# Patient Record
Sex: Female | Born: 1950 | Race: White | Hispanic: No | Smoking: Former smoker
Health system: Southern US, Community
[De-identification: ages and names within clinical notes are randomized; demographics above are authoritative.]

## PROBLEM LIST (undated history)

## (undated) DIAGNOSIS — D485 Neoplasm of uncertain behavior of skin: Secondary | ICD-10-CM

## (undated) DIAGNOSIS — M899 Disorder of bone, unspecified: Secondary | ICD-10-CM

## (undated) DIAGNOSIS — T7840XA Allergy, unspecified, initial encounter: Secondary | ICD-10-CM

## (undated) DIAGNOSIS — D649 Anemia, unspecified: Secondary | ICD-10-CM

## (undated) DIAGNOSIS — B977 Papillomavirus as the cause of diseases classified elsewhere: Secondary | ICD-10-CM

## (undated) DIAGNOSIS — H269 Unspecified cataract: Secondary | ICD-10-CM

## (undated) DIAGNOSIS — N6019 Diffuse cystic mastopathy of unspecified breast: Secondary | ICD-10-CM

## (undated) DIAGNOSIS — I10 Essential (primary) hypertension: Secondary | ICD-10-CM

## (undated) DIAGNOSIS — J302 Other seasonal allergic rhinitis: Secondary | ICD-10-CM

## (undated) DIAGNOSIS — J189 Pneumonia, unspecified organism: Secondary | ICD-10-CM

## (undated) DIAGNOSIS — Z1231 Encounter for screening mammogram for malignant neoplasm of breast: Secondary | ICD-10-CM

## (undated) DIAGNOSIS — M949 Disorder of cartilage, unspecified: Secondary | ICD-10-CM

## (undated) DIAGNOSIS — K635 Polyp of colon: Secondary | ICD-10-CM

## (undated) DIAGNOSIS — E559 Vitamin D deficiency, unspecified: Secondary | ICD-10-CM

## (undated) DIAGNOSIS — R609 Edema, unspecified: Secondary | ICD-10-CM

## (undated) DIAGNOSIS — Z1239 Encounter for other screening for malignant neoplasm of breast: Secondary | ICD-10-CM

## (undated) DIAGNOSIS — Z01419 Encounter for gynecological examination (general) (routine) without abnormal findings: Secondary | ICD-10-CM

## (undated) DIAGNOSIS — Z803 Family history of malignant neoplasm of breast: Secondary | ICD-10-CM

## (undated) DIAGNOSIS — M8580 Other specified disorders of bone density and structure, unspecified site: Secondary | ICD-10-CM

## (undated) DIAGNOSIS — R001 Bradycardia, unspecified: Secondary | ICD-10-CM

## (undated) DIAGNOSIS — M810 Age-related osteoporosis without current pathological fracture: Secondary | ICD-10-CM

## (undated) DIAGNOSIS — R4589 Other symptoms and signs involving emotional state: Secondary | ICD-10-CM

## (undated) DIAGNOSIS — E876 Hypokalemia: Secondary | ICD-10-CM

## (undated) DIAGNOSIS — M1990 Unspecified osteoarthritis, unspecified site: Secondary | ICD-10-CM

## (undated) HISTORY — PX: LEEP: SHX91

## (undated) HISTORY — DX: Neoplasm of uncertain behavior of skin: D48.5

## (undated) HISTORY — DX: Other specified disorders of bone density and structure, unspecified site: M85.80

## (undated) HISTORY — DX: Hypokalemia: E87.6

## (undated) HISTORY — DX: Disorder of bone, unspecified: M89.9

## (undated) HISTORY — PX: OTHER SURGICAL HISTORY: SHX169

## (undated) HISTORY — DX: Edema, unspecified: R60.9

## (undated) HISTORY — DX: Vitamin D deficiency, unspecified: E55.9

## (undated) HISTORY — DX: Bradycardia, unspecified: R00.1

## (undated) HISTORY — DX: Unspecified cataract: H26.9

## (undated) HISTORY — DX: Other seasonal allergic rhinitis: J30.2

## (undated) HISTORY — DX: Unspecified osteoarthritis, unspecified site: M19.90

## (undated) HISTORY — DX: Disorder of cartilage, unspecified: M94.9

## (undated) HISTORY — DX: Encounter for other screening for malignant neoplasm of breast: Z12.39

## (undated) HISTORY — PX: TONSILLECTOMY: SUR1361

## (undated) HISTORY — DX: Other symptoms and signs involving emotional state: R45.89

## (undated) HISTORY — PX: COLONOSCOPY: SHX174

## (undated) HISTORY — PX: JOINT REPLACEMENT: SHX530

## (undated) HISTORY — DX: Encounter for gynecological examination (general) (routine) without abnormal findings: Z01.419

## (undated) HISTORY — DX: Anemia, unspecified: D64.9

## (undated) HISTORY — DX: Polyp of colon: K63.5

## (undated) HISTORY — DX: Diffuse cystic mastopathy of unspecified breast: N60.19

## (undated) HISTORY — DX: Essential (primary) hypertension: I10

## (undated) HISTORY — PX: POLYPECTOMY: SHX149

## (undated) HISTORY — DX: Encounter for screening mammogram for malignant neoplasm of breast: Z12.31

## (undated) HISTORY — DX: Age-related osteoporosis without current pathological fracture: M81.0

## (undated) HISTORY — PX: EYE SURGERY: SHX253

## (undated) HISTORY — DX: Family history of malignant neoplasm of breast: Z80.3

## (undated) HISTORY — DX: Papillomavirus as the cause of diseases classified elsewhere: B97.7

## (undated) HISTORY — DX: Allergy, unspecified, initial encounter: T78.40XA

---

## 1998-03-14 ENCOUNTER — Other Ambulatory Visit: Admission: RE | Admit: 1998-03-14 | Discharge: 1998-03-14 | Payer: Self-pay | Admitting: Obstetrics and Gynecology

## 1998-10-11 ENCOUNTER — Other Ambulatory Visit: Admission: RE | Admit: 1998-10-11 | Discharge: 1998-10-11 | Payer: Self-pay | Admitting: *Deleted

## 2000-02-27 ENCOUNTER — Other Ambulatory Visit: Admission: RE | Admit: 2000-02-27 | Discharge: 2000-02-27 | Payer: Self-pay | Admitting: *Deleted

## 2001-09-24 ENCOUNTER — Other Ambulatory Visit: Admission: RE | Admit: 2001-09-24 | Discharge: 2001-09-24 | Payer: Self-pay | Admitting: *Deleted

## 2003-11-09 ENCOUNTER — Other Ambulatory Visit: Admission: RE | Admit: 2003-11-09 | Discharge: 2003-11-09 | Payer: Self-pay | Admitting: Family Medicine

## 2003-12-22 HISTORY — PX: OTHER SURGICAL HISTORY: SHX169

## 2004-02-15 ENCOUNTER — Encounter: Admission: RE | Admit: 2004-02-15 | Discharge: 2004-02-15 | Payer: Self-pay | Admitting: Family Medicine

## 2005-03-06 ENCOUNTER — Other Ambulatory Visit: Admission: RE | Admit: 2005-03-06 | Discharge: 2005-03-06 | Payer: Self-pay | Admitting: Family Medicine

## 2005-03-06 ENCOUNTER — Ambulatory Visit: Payer: Self-pay | Admitting: Family Medicine

## 2005-04-05 ENCOUNTER — Ambulatory Visit: Payer: Self-pay | Admitting: Family Medicine

## 2005-09-20 ENCOUNTER — Ambulatory Visit: Payer: Self-pay | Admitting: Family Medicine

## 2006-05-23 HISTORY — PX: OTHER SURGICAL HISTORY: SHX169

## 2006-06-03 ENCOUNTER — Ambulatory Visit: Payer: Self-pay | Admitting: Internal Medicine

## 2006-06-04 ENCOUNTER — Encounter: Payer: Self-pay | Admitting: Family Medicine

## 2006-06-04 ENCOUNTER — Ambulatory Visit: Payer: Self-pay | Admitting: Family Medicine

## 2006-06-04 ENCOUNTER — Other Ambulatory Visit: Admission: RE | Admit: 2006-06-04 | Discharge: 2006-06-04 | Payer: Self-pay | Admitting: Family Medicine

## 2007-01-14 ENCOUNTER — Encounter: Payer: Self-pay | Admitting: Family Medicine

## 2007-01-14 DIAGNOSIS — M8580 Other specified disorders of bone density and structure, unspecified site: Secondary | ICD-10-CM

## 2007-01-14 DIAGNOSIS — R609 Edema, unspecified: Secondary | ICD-10-CM

## 2007-11-25 ENCOUNTER — Ambulatory Visit: Payer: Self-pay | Admitting: Family Medicine

## 2007-11-25 ENCOUNTER — Encounter: Payer: Self-pay | Admitting: Family Medicine

## 2007-11-25 ENCOUNTER — Other Ambulatory Visit: Admission: RE | Admit: 2007-11-25 | Discharge: 2007-11-25 | Payer: Self-pay | Admitting: Family Medicine

## 2007-11-25 DIAGNOSIS — R4589 Other symptoms and signs involving emotional state: Secondary | ICD-10-CM | POA: Insufficient documentation

## 2007-12-02 ENCOUNTER — Encounter (INDEPENDENT_AMBULATORY_CARE_PROVIDER_SITE_OTHER): Payer: Self-pay | Admitting: *Deleted

## 2007-12-02 LAB — CONVERTED CEMR LAB
ALT: 15 units/L (ref 0–35)
AST: 18 units/L (ref 0–37)
BUN: 15 mg/dL (ref 6–23)
Bilirubin, Direct: 0.1 mg/dL (ref 0.0–0.3)
Creatinine, Ser: 0.8 mg/dL (ref 0.4–1.2)
Eosinophils Absolute: 0.1 10*3/uL (ref 0.0–0.7)
Eosinophils Relative: 2.3 % (ref 0.0–5.0)
GFR calc non Af Amer: 79 mL/min
Glucose, Bld: 106 mg/dL — ABNORMAL HIGH (ref 70–99)
HDL: 62.7 mg/dL (ref >39.0)
Hemoglobin: 12.8 g/dL (ref 12.0–15.0)
LDL Cholesterol: 107 mg/dL — ABNORMAL HIGH (ref 0–99)
MCHC: 34.3 g/dL (ref 30.0–36.0)
Monocytes Absolute: 0.5 10*3/uL (ref 0.1–1.0)
Neutro Abs: 3.1 10*3/uL (ref 1.4–7.7)
Neutrophils Relative %: 57.2 % (ref 43.0–77.0)
Platelets: 243 10*3/uL (ref 150–400)
Potassium: 3.9 meq/L (ref 3.5–5.1)
Sodium: 141 meq/L (ref 135–145)
TSH: 2.05 microintl units/mL (ref 0.35–5.50)
Total Bilirubin: 0.8 mg/dL (ref 0.3–1.2)
Triglycerides: 71 mg/dL (ref 0–149)
VLDL: 14 mg/dL (ref 0–40)
Vit D, 1,25-Dihydroxy: 12 — ABNORMAL LOW (ref 30–89)
WBC: 5.3 10*3/uL (ref 4.5–10.5)

## 2007-12-22 DIAGNOSIS — K635 Polyp of colon: Secondary | ICD-10-CM

## 2007-12-22 HISTORY — DX: Polyp of colon: K63.5

## 2007-12-29 ENCOUNTER — Ambulatory Visit: Payer: Self-pay | Admitting: Gastroenterology

## 2008-01-01 ENCOUNTER — Encounter: Payer: Self-pay | Admitting: Family Medicine

## 2008-01-07 ENCOUNTER — Encounter (INDEPENDENT_AMBULATORY_CARE_PROVIDER_SITE_OTHER): Payer: Self-pay | Admitting: *Deleted

## 2008-01-12 ENCOUNTER — Encounter: Payer: Self-pay | Admitting: Gastroenterology

## 2008-01-12 ENCOUNTER — Ambulatory Visit: Payer: Self-pay | Admitting: Gastroenterology

## 2008-01-13 ENCOUNTER — Encounter: Payer: Self-pay | Admitting: Gastroenterology

## 2008-03-24 ENCOUNTER — Telehealth: Payer: Self-pay | Admitting: Family Medicine

## 2008-05-18 ENCOUNTER — Encounter (INDEPENDENT_AMBULATORY_CARE_PROVIDER_SITE_OTHER): Payer: Self-pay | Admitting: *Deleted

## 2008-07-23 HISTORY — PX: OTHER SURGICAL HISTORY: SHX169

## 2008-07-28 ENCOUNTER — Encounter: Payer: Self-pay | Admitting: Family Medicine

## 2008-07-28 ENCOUNTER — Ambulatory Visit: Payer: Self-pay | Admitting: Internal Medicine

## 2009-01-05 ENCOUNTER — Encounter: Payer: Self-pay | Admitting: Family Medicine

## 2009-01-06 ENCOUNTER — Telehealth: Payer: Self-pay | Admitting: Family Medicine

## 2009-01-28 ENCOUNTER — Encounter: Payer: Self-pay | Admitting: Family Medicine

## 2009-02-01 ENCOUNTER — Encounter (INDEPENDENT_AMBULATORY_CARE_PROVIDER_SITE_OTHER): Payer: Self-pay | Admitting: *Deleted

## 2009-02-24 ENCOUNTER — Ambulatory Visit: Payer: Self-pay | Admitting: Family Medicine

## 2009-02-24 ENCOUNTER — Other Ambulatory Visit: Admission: RE | Admit: 2009-02-24 | Discharge: 2009-02-24 | Payer: Self-pay | Admitting: Family Medicine

## 2009-02-24 ENCOUNTER — Encounter: Payer: Self-pay | Admitting: Family Medicine

## 2009-02-24 DIAGNOSIS — E559 Vitamin D deficiency, unspecified: Secondary | ICD-10-CM | POA: Insufficient documentation

## 2009-02-24 DIAGNOSIS — D485 Neoplasm of uncertain behavior of skin: Secondary | ICD-10-CM

## 2009-02-28 LAB — CONVERTED CEMR LAB
ALT: 20 units/L (ref 0–35)
Albumin: 4.2 g/dL (ref 3.5–5.2)
Basophils Absolute: 0.1 10*3/uL (ref 0.0–0.1)
CO2: 28 meq/L (ref 19–32)
Chloride: 105 meq/L (ref 96–112)
Glucose, Bld: 95 mg/dL (ref 70–99)
HCT: 36.4 % (ref 36.0–46.0)
HDL: 55.1 mg/dL (ref >39.00)
LDL Cholesterol: 108 mg/dL — ABNORMAL HIGH (ref 0–99)
Lymphocytes Relative: 30.6 % (ref 12.0–46.0)
Lymphs Abs: 1.5 10*3/uL (ref 0.7–4.0)
MCV: 90.2 fL (ref 78.0–100.0)
Neutro Abs: 2.6 10*3/uL (ref 1.4–7.7)
Neutrophils Relative %: 55.4 % (ref 43.0–77.0)
Platelets: 185 10*3/uL (ref 150.0–400.0)
RDW: 13 % (ref 11.5–14.6)
Sodium: 143 meq/L (ref 135–145)
TSH: 2.29 microintl units/mL (ref 0.35–5.50)
Total CHOL/HDL Ratio: 3
Total Protein: 7.4 g/dL (ref 6.0–8.3)

## 2009-03-02 ENCOUNTER — Encounter (INDEPENDENT_AMBULATORY_CARE_PROVIDER_SITE_OTHER): Payer: Self-pay | Admitting: *Deleted

## 2009-03-10 ENCOUNTER — Encounter: Payer: Self-pay | Admitting: Family Medicine

## 2009-03-17 ENCOUNTER — Ambulatory Visit: Payer: Self-pay | Admitting: Family Medicine

## 2009-03-31 ENCOUNTER — Ambulatory Visit: Payer: Self-pay | Admitting: Family Medicine

## 2009-04-07 ENCOUNTER — Encounter (INDEPENDENT_AMBULATORY_CARE_PROVIDER_SITE_OTHER): Payer: Self-pay | Admitting: *Deleted

## 2009-04-07 ENCOUNTER — Encounter: Payer: Self-pay | Admitting: Family Medicine

## 2009-04-07 LAB — CONVERTED CEMR LAB
BUN: 12 mg/dL (ref 6–23)
CO2: 33 meq/L — ABNORMAL HIGH (ref 19–32)
Calcium: 9.6 mg/dL (ref 8.4–10.5)
Chloride: 97 meq/L (ref 96–112)
Creatinine, Ser: 0.9 mg/dL (ref 0.4–1.2)
Glucose, Bld: 109 mg/dL — ABNORMAL HIGH (ref 70–99)
Phosphorus: 3.7 mg/dL (ref 2.3–4.6)
Potassium: 3.2 meq/L — ABNORMAL LOW (ref 3.5–5.1)
Sodium: 138 meq/L (ref 135–145)

## 2009-04-22 ENCOUNTER — Ambulatory Visit: Payer: Self-pay | Admitting: Family Medicine

## 2009-04-22 DIAGNOSIS — E876 Hypokalemia: Secondary | ICD-10-CM | POA: Insufficient documentation

## 2009-08-23 ENCOUNTER — Other Ambulatory Visit: Admission: RE | Admit: 2009-08-23 | Discharge: 2009-08-23 | Payer: Self-pay | Admitting: Family Medicine

## 2009-08-23 ENCOUNTER — Ambulatory Visit: Payer: Self-pay | Admitting: Family Medicine

## 2009-08-23 DIAGNOSIS — B977 Papillomavirus as the cause of diseases classified elsewhere: Secondary | ICD-10-CM | POA: Insufficient documentation

## 2009-08-24 ENCOUNTER — Telehealth: Payer: Self-pay | Admitting: Family Medicine

## 2010-01-20 HISTORY — PX: BREAST BIOPSY: SHX20

## 2010-01-20 HISTORY — PX: OTHER SURGICAL HISTORY: SHX169

## 2010-01-25 ENCOUNTER — Encounter: Payer: Self-pay | Admitting: Family Medicine

## 2010-01-27 ENCOUNTER — Encounter: Payer: Self-pay | Admitting: Family Medicine

## 2010-02-01 ENCOUNTER — Encounter: Payer: Self-pay | Admitting: Family Medicine

## 2010-02-06 ENCOUNTER — Encounter: Payer: Self-pay | Admitting: Family Medicine

## 2010-02-07 ENCOUNTER — Ambulatory Visit: Payer: Self-pay | Admitting: Oncology

## 2010-02-09 ENCOUNTER — Encounter: Admission: RE | Admit: 2010-02-09 | Discharge: 2010-02-09 | Payer: Self-pay | Admitting: Radiology

## 2010-02-16 ENCOUNTER — Encounter: Payer: Self-pay | Admitting: Family Medicine

## 2010-02-16 LAB — CBC WITH DIFFERENTIAL (CANCER CENTER ONLY)
BASO#: 0 10*3/uL (ref 0.0–0.2)
BASO%: 0.5 % (ref 0.0–2.0)
EOS%: 2.2 % (ref 0.0–7.0)
Eosinophils Absolute: 0.1 10*3/uL (ref 0.0–0.5)
HCT: 35.2 % (ref 34.8–46.6)
HGB: 12.4 g/dL (ref 11.6–15.9)
LYMPH#: 1.5 10*3/uL (ref 0.9–3.3)
LYMPH%: 31.5 % (ref 14.0–48.0)
MCHC: 35.2 g/dL (ref 32.0–36.0)
RBC: 3.88 10*6/uL (ref 3.70–5.32)
RDW: 12.5 % (ref 10.5–14.6)

## 2010-02-16 LAB — CMP (CANCER CENTER ONLY)
BUN, Bld: 18 mg/dL (ref 7–22)
Chloride: 103 mEq/L (ref 98–108)
Glucose, Bld: 78 mg/dL (ref 73–118)
Potassium: 4 mEq/L (ref 3.3–4.7)

## 2010-02-20 HISTORY — PX: BREAST BIOPSY: SHX20

## 2010-04-07 ENCOUNTER — Telehealth (INDEPENDENT_AMBULATORY_CARE_PROVIDER_SITE_OTHER): Payer: Self-pay | Admitting: *Deleted

## 2010-04-11 ENCOUNTER — Ambulatory Visit: Payer: Self-pay | Admitting: Family Medicine

## 2010-04-11 LAB — CONVERTED CEMR LAB
ALT: 11 units/L (ref 0–35)
BUN: 18 mg/dL (ref 6–23)
Basophils Absolute: 0 10*3/uL (ref 0.0–0.1)
Basophils Relative: 0.8 % (ref 0.0–3.0)
Bilirubin, Direct: 0.1 mg/dL (ref 0.0–0.3)
CO2: 28 meq/L (ref 19–32)
Creatinine, Ser: 0.7 mg/dL (ref 0.4–1.2)
HCT: 37.7 % (ref 36.0–46.0)
LDL Cholesterol: 95 mg/dL (ref 0–99)
Lymphs Abs: 1.6 10*3/uL (ref 0.7–4.0)
MCHC: 34.2 g/dL (ref 30.0–36.0)
MCV: 94 fL (ref 78.0–100.0)
Neutro Abs: 3.5 10*3/uL (ref 1.4–7.7)
Neutrophils Relative %: 60.7 % (ref 43.0–77.0)
RDW: 14 % (ref 11.5–14.6)
TSH: 2.58 microintl units/mL (ref 0.35–5.50)
Total Protein: 6.4 g/dL (ref 6.0–8.3)

## 2010-04-13 ENCOUNTER — Ambulatory Visit: Payer: Self-pay | Admitting: Oncology

## 2010-04-13 LAB — CMP (CANCER CENTER ONLY)
Albumin: 3.6 g/dL (ref 3.3–5.5)
Alkaline Phosphatase: 59 U/L (ref 26–84)
BUN, Bld: 15 mg/dL (ref 7–22)
CO2: 29 mEq/L (ref 18–33)
Potassium: 4.6 mEq/L (ref 3.3–4.7)
Sodium: 140 mEq/L (ref 128–145)

## 2010-04-13 LAB — CBC WITH DIFFERENTIAL (CANCER CENTER ONLY)
BASO#: 0 10*3/uL (ref 0.0–0.2)
EOS%: 1.5 % (ref 0.0–7.0)
Eosinophils Absolute: 0.1 10*3/uL (ref 0.0–0.5)
HCT: 34.9 % (ref 34.8–46.6)
LYMPH#: 2.1 10*3/uL (ref 0.9–3.3)
MCHC: 34.8 g/dL (ref 32.0–36.0)
NEUT#: 3.9 10*3/uL (ref 1.5–6.5)
Platelets: 205 10*3/uL (ref 145–400)
RDW: 12.7 % (ref 10.5–14.6)

## 2010-04-14 ENCOUNTER — Ambulatory Visit: Payer: Self-pay | Admitting: Family Medicine

## 2010-04-14 ENCOUNTER — Other Ambulatory Visit: Admission: RE | Admit: 2010-04-14 | Discharge: 2010-04-14 | Payer: Self-pay | Admitting: Family Medicine

## 2010-04-19 ENCOUNTER — Encounter: Payer: Self-pay | Admitting: Family Medicine

## 2010-07-10 ENCOUNTER — Encounter: Payer: Self-pay | Admitting: Family Medicine

## 2010-07-10 ENCOUNTER — Ambulatory Visit: Payer: Self-pay | Admitting: Family Medicine

## 2010-07-12 ENCOUNTER — Ambulatory Visit: Payer: Self-pay | Admitting: Oncology

## 2010-07-12 ENCOUNTER — Encounter: Payer: Self-pay | Admitting: Family Medicine

## 2010-07-13 ENCOUNTER — Encounter: Payer: Self-pay | Admitting: Family Medicine

## 2010-07-13 LAB — IRON AND TIBC
%SAT: 25 % (ref 20–55)
Iron: 71 ug/dL (ref 42–145)
TIBC: 284 ug/dL (ref 250–470)
UIBC: 213 ug/dL

## 2010-08-04 ENCOUNTER — Encounter: Payer: Self-pay | Admitting: Family Medicine

## 2010-08-08 ENCOUNTER — Encounter (INDEPENDENT_AMBULATORY_CARE_PROVIDER_SITE_OTHER): Payer: Self-pay | Admitting: *Deleted

## 2010-08-09 ENCOUNTER — Encounter: Payer: Self-pay | Admitting: Family Medicine

## 2010-08-09 ENCOUNTER — Ambulatory Visit
Admission: RE | Admit: 2010-08-09 | Discharge: 2010-08-09 | Payer: Self-pay | Source: Home / Self Care | Attending: Family Medicine | Admitting: Family Medicine

## 2010-08-24 NOTE — Letter (Signed)
Summary: Out of Work  [employer] at [facility]  [address] [place], [place] [address]   Phone: [phone]  Fax: [phone]    August 09, 2010   Employee:  DORISTINE SHEHAN    To Whom It May Concern:   For Medical reasons, please excuse the above named employee from work for the following dates:  Start:   08/09/2010  End:   08/12/2010 if she is feeling better   If you need additional information, please feel free to contact our office.         Sincerely,    Judith Part MD

## 2010-08-24 NOTE — Assessment & Plan Note (Signed)
Summary: GO OVER MEDICAL HISTORY/DLO   Vital Signs:  Patient profile:   60 year old female Weight:      194.25 pounds BMI:     32.44 Temp:     97.9 degrees F oral Pulse rate:   76 / minute Pulse rhythm:   regular BP sitting:   130 / 76  (left arm) Cuff size:   large  Vitals Entered By: Linde Gillis CMA Duncan Dull) (August 23, 2009 3:15 PM) CC: go over medications   History of Present Illness: is doing ok overall  wants to disc getting hpv test  used to see Dr Roslyn Smiling -- gyn , 1990s  had a series of bad pap smears back then and was dx with HPV  (about 10 years after last sexual contact )  no hx of genital warts or other symptoms  has not been sexually active since then has met someone - and is considering it   wants to know what that all means   no other stds in her lifetime     Allergies: 1)  Fosamax  Past History:  Past Surgical History: Last updated: 08/09/2008 Tonsillectomy LEEP-HPV Dexa- osteopenia (12/2003)   stable (05/2006) dexa- osteopenia slt imp (01/10)  Family History: Last updated: 11/25/2007 Father: diabetes, ?skin cancer Mother: deceased age 22- breast ca, OP sister Osteopenia , breast cancer  GM breast ca cousin breast ca  PGF MI  Siblings:   Social History: Last updated: 11/25/2007 Marital Status: divorced Children: 1 son 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 Eyes:  Denies blurring and eye irritation. CV:  Denies chest pain or discomfort, palpitations, and shortness of breath with exertion. Resp:  Denies cough and wheezing. GI:  Denies abdominal pain, bloody stools, and change in bowel habits. GU:  Denies abnormal  vaginal bleeding, discharge, dysuria, genital sores, and urinary frequency. MS:  Denies joint pain. Derm:  Denies lesion(s), poor wound healing, and rash. Neuro:  Denies numbness and tingling. Psych:  Denies anxiety and depression.  Physical Exam  General:  overweight but generally well appearing  Mouth:  pharynx pink and moist.   Neck:  No deformities, masses, or tenderness noted. Lungs:  Normal respiratory effort, chest expands symmetrically. Lungs are clear to auscultation, no crackles or wheezes. Heart:  Normal rate and regular rhythm. S1 and S2 normal without gallop, murmur, click, rub or other extra sounds. Abdomen:  no suprapubic tenderness or fullness felt  Genitalia:  normal introitus, no external lesions, no vaginal discharge, mucosa pink and moist, no vaginal or cervical lesions, no vaginal atrophy, and no friaility or hemorrhage.   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 Take by  mouth as directed prn 3)  Tums 500 Mg Chew (Calcium carbonate antacid) .... 2 tabs by mouth two times a day  Patient Instructions: 1)  I will update you when labs return 2)  here is handout on hpv   Current Allergies (reviewed today): FOSAMAX

## 2010-08-24 NOTE — Miscellaneous (Signed)
  Medications Added VITAMIN D 1000 UNIT  TABS (CHOLECALCIFEROL) 4,000 international units daily       Clinical Lists Changes  Medications: Added new medication of VITAMIN D 1000 UNIT  TABS (CHOLECALCIFEROL) 4,000 international units daily

## 2010-08-24 NOTE — Letter (Signed)
[facility]   Imported By: Maryln Gottron 03/07/2010 12:20:15  _____________________________________________________________________  External Attachment:    Type:   Image     Comment:   External Document

## 2010-08-24 NOTE — Letter (Signed)
Summary: [facility]  [facility]   Imported By: Maryln Gottron 07/25/2010 13:09:35  _____________________________________________________________________  External Attachment:    Type:   Image     Comment:   External Document

## 2010-08-24 NOTE — Letter (Signed)
Summary: Results Follow up Letter  [place] at [facility]  [address] [place], [place] [address]   Phone: [phone]  Fax: [phone]    04/19/2010 MRN: [id number]    FRIDA WAHLSTROM [address] [place], [place]  [address]    Dear Ms. Preis,  The following are the results of your recent test(s):  Test         Result    Pap Smear:        Normal __X___  Not Normal _____ Comments: ______________________________________________________ Cholesterol: LDL(Bad cholesterol):         Your goal is less than:         HDL (Good cholesterol):       Your goal is more than: Comments:  ______________________________________________________ Mammogram:        Normal _____  Not Normal _____ Comments:  ___________________________________________________________________ Hemoccult:        Normal _____  Not normal _______ Comments:    _____________________________________________________________________ Other Tests:    We routinely do not discuss normal results over the telephone.  If you desire a copy of the results, or you have any questions about this information we can discuss them at your next office visit.   Sincerely,    Idamae Schuller Tower,MD  MT/ri

## 2010-08-24 NOTE — Assessment & Plan Note (Signed)
Summary: CPX/CLE   Vital Signs:  Patient profile:   60 year old female Height:      65 inches Weight:      190.25 pounds BMI:     31.77 Temp:     98.2 degrees F oral Pulse rate:   76 / minute Pulse rhythm:   regular BP sitting:   128 / 80  (left arm) Cuff size:   large  Vitals Entered By: Lewanda Rife LPN (April 14, 2010 2:34 PM) CC: CPX LMP 6 yrs ago   History of Present Illness: here for health mt visit and gyn care and to rev chronic health problems  is doing ok overall   wt is down 4 lb lost more and gained a bit back is still going to the gym  she was referred to nutritionist  lives on snack foods and too much sodium   bp 128/80 -good   osteopenia 1/10- slt imp in dexa vit D def with level of 27  is not take any vit D   other labs ok good lipid prof Last Lipid ProfileCholesterol: 186 (04/11/2010 8:32:48 AM)HDL:  80.20 (04/11/2010 8:32:48 AM)LDL:  95 (04/11/2010 8:32:48 AM)Triglycerides:  Last Liver profileSGOT:  19 (04/11/2010 8:32:48 AM)SPGT:  11 (04/11/2010 8:32:48 AM)T. Bili:  0.8 (04/11/2010 8:32:48 AM)Alk Phos:  53 (04/11/2010 8:32:48 AM)   hx of abn pap /hpv/ leep pap nl 8/10 no gyn problems   has hx of breast ca in family and personal hx of fibrocystic change on evista for this and also Openia -- is miserable with hot flashes is going to the high risk breast clinic and following with that  had breast MRI this summer that was ok - also a breast bx that was normal    Td 05  flu shot - wants to get today     Allergies: 1)  Fosamax  Past History:  Past Medical History: Last updated: 03/04/2010 Osteopenia 6/09 colonoscopy polyp edema  vit D def low K from diuretic  hx of hpv with abn paps fibrocystic breasts  on evista for breast ca prophylaxis   Past Surgical History: Last updated: 03/04/2010 Tonsillectomy LEEP-HPV Dexa- osteopenia (12/2003)   stable (05/2006) dexa- osteopenia slt imp (01/10) 7/11 breast bx 7/11 breast MRI  normal breast bx fibrocystic change 8/11  Family History: Last updated: 11/25/2007 Father: diabetes, ?skin cancer Mother: deceased age 106- breast ca, OP sister Osteopenia , breast cancer  GM breast ca cousin breast ca  PGF MI  Siblings:   Social History: Last updated: 11/25/2007 Marital Status: divorced Children: 1 son 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 Eyes:  Denies blurring and eye pain. CV:  Denies chest pain or discomfort, palpitations, and shortness of breath with exertion. Resp:  Denies cough, shortness of breath, and wheezing. GI:  Denies abdominal pain, bloody stools, change in bowel habits, and indigestion. GU:  Denies discharge and dysuria. MS:  Denies muscle aches, cramps, and muscle weakness. Derm:  Denies itching, lesion(s), poor wound healing, and rash. Neuro:  Denies headaches, numbness, and tingling. Psych:  Denies anxiety and depression. Endo:  Complains of heat intolerance; denies cold intolerance, excessive thirst, and excessive urination. Heme:  Denies abnormal bruising and bleeding.  Physical Exam  General:  overweight but generally well appearing  Head:  normocephalic,  atraumatic, and no abnormalities observed.   Eyes:  vision grossly intact, pupils equal, pupils round, and pupils reactive to light.   Ears:  R ear normal and L ear normal.   Nose:  no nasal discharge.   Mouth:  pharynx pink and moist.   Neck:  No deformities, masses, or tenderness noted. Chest Wall:  No deformities, masses, or tenderness noted. Breasts:  No mass, nodules, thickening, tenderness, bulging, retraction, inflamation, nipple discharge or skin changes noted.   Lungs:  Normal respiratory effort, chest expands symmetrically. Lungs are clear to auscultation, no crackles or  wheezes. Heart:  Normal rate and regular rhythm. S1 and S2 normal without gallop, murmur, click, rub or other extra sounds. Abdomen:  Bowel sounds positive,abdomen soft and non-tender without masses, organomegaly or hernias noted. no renal bruits  Genitalia:  Normal introitus for age, no external lesions, no vaginal discharge, mucosa pink and moist, no vaginal or cervical lesions, no vaginal atrophy, no friaility or hemorrhage, normal uterus size and position, no adnexal masses or tenderness Msk:  No deformity or scoliosis noted of thoracic or lumbar spine.  no acute joint changes  Pulses:  R and L carotid,radial,femoral,dorsalis pedis and posterior tibial pulses are full and equal bilaterally Extremities:  No clubbing, cyanosis, edema, or deformity noted with normal full range of motion of all joints.   Neurologic:  sensation intact to light touch, gait normal, and DTRs symmetrical and normal.   Skin:  Intact without suspicious lesions or rashes Cervical Nodes:  No lymphadenopathy noted Axillary Nodes:  No palpable lymphadenopathy Inguinal Nodes:  No significant adenopathy Psych:  normal affect, talkative and pleasant    Impression & Recommendations:  Problem # 1:  HEALTH MAINTENANCE EXAM (ICD-V70.0) Assessment Comment Only reviewed health habits including diet, exercise and skin cancer prevention reviewed health maintenance list and family history labs reviewed in detail  flu shot today  Problem # 2:  ROUTINE GYNECOLOGICAL EXAMINATION (ICD-V72.31) Assessment: Comment Only with hx of hpv and leep/ dysplasia in past  pap today  Problem # 3:  HX, FAMILY, MALIGNANCY, BREAST (ICD-V16.3) Assessment: Comment Only up to date and following with high risk clinic  recent MRI and nl bx  on evista and getting by  Problem # 4:  UNSPECIFIED VITAMIN D DEFICIENCY (ICD-268.9) Assessment: Deteriorated pt not on D disc inc to vit D 3 otc 4000 international units daily  re check this in 3 mo   explained imp of this to bone and overall health  Problem # 5:  OSTEOPENIA (ICD-733.90) Assessment: Unchanged in dexa last year now on evista  disc imp of ca and vit D  Her updated medication list for this problem includes:    Evista 60 Mg Tabs (Raloxifene hcl) .Marland Kitchen... Take 1 tablet by mouth once a day  Complete Medication List: 1)  Advil Migraine 200 Mg Caps (Ibuprofen) 2)  Aleve 220 Mg Caps (Naproxen sodium) .... Take by mouth as directed prn 3)  Tums 500 Mg Chew (Calcium carbonate antacid) .... 2 tabs by mouth two times a day 4)  Tylenol Pm Extra Strength 500-25 Mg Tabs (Diphenhydramine-apap (sleep)) .... Otc as directed. 5)  Evista 60 Mg Tabs (Raloxifene hcl) .... Take 1 tablet by mouth once a day  Other Orders: Admin 1st Vaccine (13086) Flu Vaccine 54yrs + (57846)  Patient Instructions: 1)  start multivitamin daily  2)  calcium 1200-1500 mg per day 3)  for now I want you to take 4000 international units of vitamin D per day  4)  schedule non fasting lab for 3 months for vit D level (vit D def please)   Current Allergies (reviewed today): FOSAMAX      Flu Vaccine Consent Questions     Do you have a history of severe allergic reactions to this vaccine? no    Any prior history of allergic reactions to egg and/or gelatin? no    Do you have a sensitivity to the preservative Thimersol? no    Do you have a past history of Guillan-Barre Syndrome? no    Do you currently have an acute febrile illness? no    Have you ever had a severe reaction to latex? no    Vaccine information given and explained to patient? yes    Are you currently pregnant? no    Lot Number:[id number]   Exp Date:01/20/2011   Site Given  Left Deltoid IMlbflu Lewanda Rife LPN  April 14, 2010 3:55 PM

## 2010-08-24 NOTE — Progress Notes (Signed)
Summary: does she need a pap  Phone Note Other Incoming Call back at [phone]   Caller: Dois Davenport from [facility] Summary of Call: Dois Davenport from [employer] called about patient being there for the HPV and wants to know if you want a pap done as well.  Initial call taken by: Melody Comas,  August 24, 2009 9:11 AM  Follow-up for Phone Call        thanks- no she does not need a pap -- had one in summer  just want to know hpv status from infection years ago  Follow-up by: Judith Part MD,  August 24, 2009 10:11 AM  Additional Follow-up for Phone Call Additional follow up Details #1::        Advised Santra at cytology. Additional Follow-up by: Lowella Petties CMA,  August 24, 2009 10:18 AM

## 2010-08-24 NOTE — Letter (Signed)
Summary: Results Follow up Letter  [place] at [facility]  [address] [place], [place] [address]   Phone: [phone]  Fax: [phone]    08/08/2010 MRN: [id number]  [address] [place], [place]  [address]  Dear Ms. [name],  The following are the results of your recent test(s):  Test         Result    Pap Smear:        Normal _____  Not Normal _____ Comments: ______________________________________________________ Cholesterol: LDL(Bad cholesterol):         Your goal is less than:         HDL (Good cholesterol):       Your goal is more than: Comments:  ______________________________________________________ Mammogram:        Normal __X___  Not Normal _____ Comments: Repeat in 6 months  ___________________________________________________________________ Hemoccult:        Normal _____  Not normal _______ Comments:    _____________________________________________________________________ Other Tests:    We routinely do not discuss normal results over the telephone.  If you desire a copy of the results, or you have any questions about this information we can discuss them at your next office visit.   Sincerely,      Idamae Schuller Tower,MD

## 2010-08-24 NOTE — Progress Notes (Signed)
----   Converted from flag ---- ---- 04/06/2010 3:58 PM, Colon Flattery Tower MD wrote: please check wellness/ lipid and vit D for v70.0 and vit D def   ---- 04/06/2010 9:42 AM, Liane Comber CMA (AAMA) wrote: Lab orders please! Good Morning! This pt is scheduled for cpx labs Tuesday, which labs to draw and dx codes to use? Thanks Tasha ------------------------------

## 2010-08-24 NOTE — Assessment & Plan Note (Signed)
Summary: CONGESTION,RUNNY NOSE,ST,EYES/CLE   Vital Signs:  Patient profile:   60 year old female Weight:      195.75 pounds BMI:     32.69 Temp:     99.0 degrees F oral Pulse rate:   80 / minute Pulse rhythm:   regular BP sitting:   118 / 78  (left arm) Cuff size:   large  Vitals Entered By: Selena Batten Dance CMA Duncan Dull) (August 09, 2010 3:43 PM) CC: Congestion, cough, ST, HA x4 days   History of Present Illness: woke up sunday with a sore throat  chilled and achey - thought she had a fever bad nasal congestion -- clear nasal discharge ST is better  ears are itchy - not painful   some cough -- is dry only with no production   headache is over top of head  face is not hurting   does not get colds very often    Allergies: 1)  Fosamax  Past History:  Past Medical History: Last updated: 03/04/2010 Osteopenia 6/09 colonoscopy polyp edema  vit D def low K from diuretic  hx of hpv with abn paps fibrocystic breasts  on evista for breast ca prophylaxis   Past Surgical History: Last updated: 03/04/2010 Tonsillectomy LEEP-HPV Dexa- osteopenia (12/2003)   stable (05/2006) dexa- osteopenia slt imp (01/10) 7/11 breast bx 7/11 breast MRI normal breast bx fibrocystic change 8/11  Family History: Last updated: 11/25/2007 Father: diabetes, ?skin cancer Mother: deceased age 65- breast ca, OP sister Osteopenia , breast cancer  GM breast ca cousin breast ca  PGF MI  Siblings:   Social History: Last updated: 11/25/2007 Marital Status: divorced Children: 1 son 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 Eyes:  Denies blurring, discharge, and eye irritation. ENT:  Complains of nasal congestion, postnasal drainage, and sore throat. CV:  Denies chest pain or discomfort and palpitations. Resp:   Complains of cough; denies pleuritic, shortness of breath, sputum productive, and wheezing. GI:  Denies diarrhea, nausea, and vomiting. Derm:  Denies rash.  Physical Exam  General:  overwt and fatigued appearing  Head:  normocephalic, atraumatic, and no abnormalities observed.  no sinus tenderness  Eyes:  vision grossly intact, pupils equal, pupils round, pupils reactive to light, and no injection.   Ears:  R ear normal and L ear normal.   Nose:  nares are injected and congested bilaterally  Mouth:  pharynx pink and moist, no erythema, and no exudates.   Neck:  supple with full rom and no masses or thyromegally, no JVD or carotid bruit  Chest Wall:  No deformities, masses, or tenderness noted. Lungs:  Normal respiratory effort, chest expands symmetrically. Lungs are clear to auscultation, no crackles or wheezes. Heart:  Normal rate and regular rhythm. S1 and S2 normal without gallop, murmur, click, rub or other extra sounds. Skin:  Intact without suspicious lesions or rashes Cervical Nodes:  No lymphadenopathy noted Psych:  normal affect, talkative and pleasant    Impression & Recommendations:  Problem # 1:  VIRAL URI (ICD-465.9) Assessment New with head and chest congestion that is slowly improving  adv trial of aleve and mucinex as needed tessalon if needed pt advised to update me if symptoms worsen or do not improve   Her updated medication list for this problem includes:    Advil Migraine  200 Mg Caps (Ibuprofen)    Aleve 220 Mg Caps (Naproxen sodium) .Marland Kitchen... Take by mouth as directed prn    Tessalon 200 Mg Caps (Benzonatate) .Marland Kitchen... 1 by mouth up to three times a day as needed cough  Complete Medication List: 1)  Advil Migraine 200 Mg Caps (Ibuprofen) 2)  Aleve 220 Mg Caps (Naproxen sodium) .... Take by mouth as directed prn 3)  Tums 500 Mg Chew (Calcium carbonate antacid) .... 2 tabs by mouth two times a day 4)  Tylenol Pm Extra Strength 500-25 Mg Tabs (Diphenhydramine-apap  (sleep)) .... Otc as directed. 5)  Evista 60 Mg Tabs (Raloxifene hcl) .... Take 1 tablet by mouth once a day 6)  Vitamin D 1000 Unit Tabs (Cholecalciferol) .... 4,000 international units daily 7)  Calcium 1200 1200-1000 Mg-unit Chew (Calcium carbonate-vit d-min) .Marland Kitchen.. 1 by mouth once daily 8)  Tessalon 200 Mg Caps (Benzonatate) .Marland Kitchen.. 1 by mouth up to three times a day as needed cough  Patient Instructions: 1)  I think you have a head and chest cold  2)  try mucinex over the counter to loosen mucous  3)  also aleve 2 pills twice daily with food for fever and congestion  4)  drink lots of fluids  5)  can also use nasal saline spray as directed to clear sinuses  6)  I expect cough will worsen before it gets better  7)  try tessalon if needed 8)  update me if increasing fever / facial pain or much worse cough -- or if not starting to improve in another week  Prescriptions: TESSALON 200 MG CAPS (BENZONATATE) 1 by mouth up to three times a day as needed cough  #30 x 0   Entered and Authorized by:   Judith Part MD   Signed by:   Judith Part MD on 08/09/2010   Method used:   Print then Give to Patient   RxID:   [id number]    Orders Added: 1)  Est. Patient Level III [14782]    Current Allergies (reviewed today): FOSAMAX Prevention & Chronic Care Immunizations   Influenza vaccine: Fluvax 3+  (04/14/2010)    Tetanus booster: 11/09/2003: Td    Pneumococcal vaccine: Not documented  Colorectal Screening   Hemoccult: Not documented    Colonoscopy: Adenomatous Polyp  (01/14/2008)   Colonoscopy due: 01/2013  Other Screening   Pap smear: normal  (04/14/2010)    Mammogram: abnormal right  (01/25/2010)   Mammogram action/deferral: Screening mammogram in 1 year.     (01/31/2009)   Mammogram due: 01/2009   Smoking status: quit  (01/14/2007)  Lipids   Total Cholesterol: 186  (04/11/2010)   LDL: 95  (04/11/2010)   LDL Direct: Not documented   HDL: 80.20  (04/11/2010)    Triglycerides: 54.0  (04/11/2010)

## 2010-10-19 ENCOUNTER — Other Ambulatory Visit: Payer: Self-pay | Admitting: Oncology

## 2010-10-19 ENCOUNTER — Encounter (HOSPITAL_BASED_OUTPATIENT_CLINIC_OR_DEPARTMENT_OTHER): Payer: 59 | Admitting: Oncology

## 2010-10-19 DIAGNOSIS — N6019 Diffuse cystic mastopathy of unspecified breast: Secondary | ICD-10-CM

## 2010-10-19 DIAGNOSIS — Z803 Family history of malignant neoplasm of breast: Secondary | ICD-10-CM

## 2010-10-19 DIAGNOSIS — N951 Menopausal and female climacteric states: Secondary | ICD-10-CM

## 2010-10-19 DIAGNOSIS — R5381 Other malaise: Secondary | ICD-10-CM

## 2010-10-19 LAB — CBC WITH DIFFERENTIAL/PLATELET
HGB: 12.4 g/dL (ref 11.6–15.9)
LYMPH%: 27.4 % (ref 14.0–49.7)
MONO#: 0.5 10*3/uL (ref 0.1–0.9)
MONO%: 10.9 % (ref 0.0–14.0)
NEUT#: 2.5 10*3/uL (ref 1.5–6.5)
RDW: 13.5 % (ref 11.2–14.5)

## 2010-10-19 LAB — COMPREHENSIVE METABOLIC PANEL
BUN: 17 mg/dL (ref 6–23)
Creatinine, Ser: 0.73 mg/dL (ref 0.40–1.20)
Glucose, Bld: 98 mg/dL (ref 70–99)
Potassium: 4.1 mEq/L (ref 3.5–5.3)
Sodium: 138 mEq/L (ref 135–145)

## 2010-10-25 ENCOUNTER — Encounter: Payer: Self-pay | Admitting: Family Medicine

## 2010-10-25 ENCOUNTER — Ambulatory Visit (INDEPENDENT_AMBULATORY_CARE_PROVIDER_SITE_OTHER): Payer: 59 | Admitting: Family Medicine

## 2010-10-25 VITALS — BP 124/78 | HR 68 | Temp 98.3°F | Ht 65.0 in | Wt 200.1 lb

## 2010-10-25 DIAGNOSIS — W57XXXA Bitten or stung by nonvenomous insect and other nonvenomous arthropods, initial encounter: Secondary | ICD-10-CM | POA: Insufficient documentation

## 2010-10-25 DIAGNOSIS — T148 Other injury of unspecified body region: Secondary | ICD-10-CM

## 2010-10-25 DIAGNOSIS — T148XXA Other injury of unspecified body region, initial encounter: Secondary | ICD-10-CM

## 2010-10-25 NOTE — Progress Notes (Signed)
  Subjective:    Patient ID: Denise Ali, female    DOB: 02-May-1951, 60 y.o.   MRN: [id number]  HPI CC: remove ticks  Last night found tick on stomach and this morning one on thigh.  Tried to remove one on abd, but thinks part remained.  Outside Sunday working in yard.  Has 2 dogs.  Will get frontline.  First time had tick bite.  No HA, fevers, abd pain, rash, nausea, myalgias or arthralgias.  Tetanus in 2005  Review of Systems Per HPI    Objective:   Physical Exam  [vitalsreviewed. Constitutional: She appears well-developed and well-nourished. No distress.  Skin: Skin is warm and dry. No rash noted.       Left mid abdomen with head of tick still embedded in skin. Right inner upper thigh with tick still attached          Assessment & Plan:

## 2010-10-25 NOTE — Assessment & Plan Note (Signed)
Area cleaned with alcohol, abdominal site tick head removed with forceps, no residual foreign body.  Thigh site entire tick removed with forceps. Look like lonestar tick. Precautions discussed as well as sxs to call for abx (if any HA, fever, abd pain, rash, nausea, arthralgia, call us or return).

## 2010-10-25 NOTE — Patient Instructions (Addendum)
Wood Tick Bites Ticks are insects that attach themselves to the skin. Most tick bites are harmless, but sometimes ticks carry diseases that can make a person quite ill. The chance of getting ill depends on:  The kind of tick that bites you.   Time of year.   How long the tick is attached.   Geographic location.  Wood ticks are also called dog ticks. They are generally black. They can have white markings. They live in shrubs and grassy areas. They are larger than deer ticks. Wood ticks are about the size of a watermelon seed. They have a hard body. The most common places for ticks to attach themselves are the scalp, neck, armpits, waist, and groin. Wood tics may stay attached for up to 2 weeks. TICKS MUST BE REMOVED AS SOON AS POSSIBLE TO HELP PREVENT DISEASES CAUSED BY TICK BITES.  TO REMOVE A TICK: 1. If available, put on latex gloves before trying to remove a tick.  2. Grasp the tick as close to the skin as possible, with curved forceps, fine tweezers or a special tick removal tool.  3. Pull gently with steady pressure until the tick lets go. Do not twist the tick or jerk it suddenly. This may break off the tick's head or mouth parts.  4. Do not crush the tick's body. This could force disease-carrying fluids from the tick into your body.  5. After the tick is removed, wash the bite area and your hands with soap and water or other disinfectant.  6. Apply a small amount of antiseptic cream or ointment to the bite site.  7. Wash and disinfect any instruments that were used.  8. Save the tick in a jar or plastic bag for later identification. Preserve the tick with a bit of alcohol or put it in the freezer.  9. Do not apply a hot match, petroleum jelly, or fingernail polish to the tick. This does not work and may increase the chances of disease from the tick bite.  YOU MAY NEED TO SEE YOUR CAREGIVER FOR A TETANUS SHOT NOW IF:  You have no idea when you had the last one.   You have never had a  tetanus shot before.  If you need a tetanus shot, and you decide not to get one, there is a rare chance of getting tetanus. Sickness from tetanus can be serious. If you get a tetanus shot, your arm may swell, get red and warm to the touch at the shot site. This is common and not a problem. TO PREVENT TICK BITES WHEN IN A TICK INFESTED AREA:  Wear protective clothing. Long sleeves and pants are best.   Wear white clothes to see ticks more easily   Tuck your pant legs into your socks.   If walking on trail, stay in the middle of the trail to avoid brushing against bushes.   Put insect repellent on all exposed skin and along boot tops, pant legs and sleeve cuffs   Check clothing, hair and skin repeatedly and before coming inside.   Brush off any ticks that are not attached.  SEEK MEDICAL CARE IF:  You cannot remove a tick or part of the tick that is left in the skin.   Unexplained fever.   Redness and swelling in the area of the tick bite.   Tender, swollen lymph glands.   Diarrhea.   Weight loss.   Cough.   Fatigue.   Muscle, joint or bone pain.  Belly pain.   Headache.   Rash.  SEEK IMMEDIATE MEDICAL CARE IF:  You develop an oral temperature above 101.   You are having trouble walking or moving your legs.   Numbness in the legs.   Shortness of breath.   Confusion.   Repeated vomiting.  Document Released: 07/06/2000 Document Re-Released: 06/21/2008 [facility] Patient Information 2011 [place], [place].

## 2011-01-26 ENCOUNTER — Encounter: Payer: Self-pay | Admitting: Family Medicine

## 2011-03-02 ENCOUNTER — Ambulatory Visit (INDEPENDENT_AMBULATORY_CARE_PROVIDER_SITE_OTHER): Payer: 59 | Admitting: Family Medicine

## 2011-03-02 ENCOUNTER — Encounter: Payer: Self-pay | Admitting: Family Medicine

## 2011-03-02 DIAGNOSIS — R05 Cough: Secondary | ICD-10-CM | POA: Insufficient documentation

## 2011-03-02 MED ORDER — BENZONATATE 200 MG PO CAPS: 200.0000 mg | ORAL_CAPSULE | Freq: Three times a day (TID) | ORAL | Status: AC | PRN

## 2011-03-02 NOTE — Patient Instructions (Signed)
Take tessalon 3 times a day and this should gradually get better.  It may take weeks to go away.  Let us know if you don't keep improving.

## 2011-03-02 NOTE — Assessment & Plan Note (Addendum)
Likely postviral. Supportive tx and f/u prn.  Should gradually resolve. Nontoxic, okay for outpatient f/u. She agrees.

## 2011-03-02 NOTE — Progress Notes (Signed)
3 weeks ago- ST that resolved.  Dry cough since then.  Usually dry in AM, deeper later in the day.  No sputum.  No fevers.  Some myalgias initially, resolved now.  No rhinorrhea, no ear pain throughout.  No wheeze.  Distant smoker.  Cough is very slowly getting better.  No heartburn.  No vomiting, diarrhea.   Feeling well o/w.  Meds, vitals, and allergies reviewed.   ROS: See HPI.  Otherwise, noncontributory.  GEN: nad, alert and oriented HEENT: mucous membranes moist, tm wnl, OP wnl NECK: supple w/o LA CV: rrr. PULM: ctab, no inc wob, occ dry cough ABD: soft, +bs EXT: no edema SKIN: no acute rash

## 2011-03-04 ENCOUNTER — Encounter: Payer: Self-pay | Admitting: Family Medicine

## 2011-03-24 DIAGNOSIS — R001 Bradycardia, unspecified: Secondary | ICD-10-CM

## 2011-03-24 HISTORY — PX: OTHER SURGICAL HISTORY: SHX169

## 2011-03-24 HISTORY — DX: Bradycardia, unspecified: R00.1

## 2011-04-09 ENCOUNTER — Ambulatory Visit (INDEPENDENT_AMBULATORY_CARE_PROVIDER_SITE_OTHER): Payer: 59 | Admitting: Family Medicine

## 2011-04-09 ENCOUNTER — Encounter: Payer: Self-pay | Admitting: Family Medicine

## 2011-04-09 VITALS — BP 120/80 | HR 68 | Temp 98.3°F | Ht 65.0 in | Wt 200.5 lb

## 2011-04-09 DIAGNOSIS — I499 Cardiac arrhythmia, unspecified: Secondary | ICD-10-CM | POA: Insufficient documentation

## 2011-04-09 NOTE — Patient Instructions (Signed)
Your EKG was normal today except slow heartbeat - which seems to be baseline for you  No skipped or extra beats today  Nothing to do at this time If you develop palpitations/ chest pain/ dizziness/ fainting -- please update [place] asap

## 2011-04-09 NOTE — Assessment & Plan Note (Signed)
Pt is entirely asymptomatic Last EKG from cataract surgery- rate of 57 with some PACs Today rate of 58 with nl sinus rhythm and no acute changes at all  Will continue to monitor - pt will alert me if any dizziness/ palpitations or other symptoms Given her copy of todays EKG as well

## 2011-04-09 NOTE — Progress Notes (Signed)
Subjective:    Patient ID: Denise Ali, female    DOB: Sep 02, 1950, 60 y.o.   MRN: [id number]  HPI Had cataract surgery 2 wk ago -- did notice irregular heartbeat  Did EKG -- bradycardia with frequent PACs  No symptoms at all  No palpitations , no dizzy or sob or cp  No more tired than usual  Is on eye meds   Takes anaprox very rarely for joint pain   No one in family with heart problems   Pt is overwt Good bp  No meds to slow HR  No otc herbs / etc  Patient Active Problem List  Diagnoses  . HUMAN PAPILLOMAVIRUS  . NEOPLASM, SKIN, UNCERTAIN BEHAVIOR  . UNSPECIFIED VITAMIN D DEFICIENCY  . HYPOKALEMIA  . STRESS REACTION, ACUTE, WITH EMOTIONAL DISTURBANCE  . OSTEOPENIA  . EDEMA  . Tick bite  . Cough  . Irregular heart beat   Past Medical History  Diagnosis Date  . Edema   . 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 Family history of malignant neoplasm of breast     evista for BRCA ppx  . Hypopotassemia   . Neoplasm of uncertain behavior of skin   . Disorder of bone and cartilage, unspecified   . Other screening mammogram   . Routine gynecological examination   . Predominant disturbance of emotions   . Unspecified vitamin D deficiency   . Colon polyp 12/2007  . Fibrocystic breast   . Bradycardia 9/12    mild - at one time with PACs/ asymptomatic    Past Surgical History  Procedure Date  . Tonsillectomy   . Leep     HPV  . Dexa 12/2003    osteopenia  . Dexa 11/07    stable  . Dexa 1/10    Osteopenia-slightly improved  . Breast biopsy 01/2010  . Breast mri 01/2010    Normal  . Breast biopsy 8/11    Fibrocystic change  . Cataracts 9/12    bilateral   History  Substance Use Topics  . Smoking status: Former Smoker    Quit date: 07/23/1985  . Smokeless tobacco: Not on file   Comment: Quit "years ago"  . Alcohol Use: Yes     Occasional   Family History  Problem Relation Age of Onset  . Diabetes Father    . Skin cancer Father     ?  Marland Kitchen Breast cancer Mother   . Osteoporosis Mother   . Osteopenia Sister   . Breast cancer Sister   . Breast cancer      Grandmother  . Breast cancer      cousin  . Heart attack Paternal Grandfather    Allergies  Allergen Reactions  . Alendronate Sodium     REACTION: leg and joint pain   Current Outpatient Prescriptions on File Prior to Visit  Medication Sig Dispense Refill  . Calcium 1200-1000 MG-UNIT CHEW Chew 1 tablet by mouth daily.        . raloxifene (EVISTA) 60 MG tablet Take 60 mg by mouth daily.        . calcium carbonate (TUMS - DOSED IN MG ELEMENTAL CALCIUM) 500 MG chewable tablet Chew 2 tablets by mouth 2 (two) times daily.        . cholecalciferol (VITAMIN D) 1000 UNITS tablet Take 1,000 Units by mouth daily.        . diphenhydramine-acetaminophen (TYLENOL PM) 25-500 MG TABS Take 1 tablet by  mouth at bedtime as needed.        . naproxen sodium (ANAPROX) 220 MG tablet Take 220 mg by mouth daily.             Review of Systems Review of Systems  Constitutional: Negative for fever, appetite change, fatigue and unexpected weight change.  Eyes: Negative for pain and visual disturbance.  Respiratory: Negative for cough and shortness of breath.   Cardiovascular: Negative for cp or palpitations    Gastrointestinal: Negative for nausea, diarrhea and constipation.  Genitourinary: Negative for urgency and frequency.  Skin: Negative for pallor or rash   Neurological: Negative for weakness, light-headedness, numbness and headaches.  Hematological: Negative for adenopathy. Does not bruise/bleed easily.  Psychiatric/Behavioral: Negative for dysphoric mood. The patient is not nervous/anxious.          Objective:   Physical Exam  Constitutional: She appears well-developed and well-nourished. No distress.       overwt and well appearing   HENT:  Head: Normocephalic and atraumatic.  Mouth/Throat: Oropharynx is clear and moist.  Eyes:  Conjunctivae and EOM are normal. Pupils are equal, round, and reactive to light. No scleral icterus.  Neck: Normal range of motion. Neck supple. No JVD present. Carotid bruit is not present. No thyromegaly present.  Cardiovascular: Normal rate, regular rhythm, normal heart sounds and intact distal pulses.  Exam reveals no gallop and no friction rub.   No murmur heard.      No irregularity heard whatsoever during exam  Pulmonary/Chest: Effort normal and breath sounds normal. No respiratory distress. She has no wheezes.  Abdominal: Soft. Bowel sounds are normal. She exhibits no distension and no mass. There is no tenderness.  Musculoskeletal: She exhibits no edema.  Lymphadenopathy:    She has no cervical adenopathy.  Neurological: She is alert. She has normal reflexes. No cranial nerve deficit. Coordination normal.       No tremor  Skin: Skin is warm and dry. No rash noted. No erythema. No pallor.  Psychiatric: She has a normal mood and affect.       Pt is not anxious           Assessment & Plan:

## 2011-06-06 ENCOUNTER — Telehealth: Payer: Self-pay | Admitting: Oncology

## 2011-06-06 NOTE — Telephone Encounter (Signed)
called pts home on 06/04/2011 no response, will mail pts appt for march 2013 on 06/06/2011

## 2011-06-07 ENCOUNTER — Other Ambulatory Visit: Payer: Self-pay | Admitting: Oncology

## 2011-07-02 ENCOUNTER — Ambulatory Visit (INDEPENDENT_AMBULATORY_CARE_PROVIDER_SITE_OTHER): Payer: 59 | Admitting: Family Medicine

## 2011-07-02 ENCOUNTER — Ambulatory Visit (INDEPENDENT_AMBULATORY_CARE_PROVIDER_SITE_OTHER)
Admission: RE | Admit: 2011-07-02 | Discharge: 2011-07-02 | Disposition: A | Discharge status: Home / Self Care | Payer: 59 | Source: Ambulatory Visit | Attending: Family Medicine | Admitting: Family Medicine

## 2011-07-02 ENCOUNTER — Encounter: Payer: Self-pay | Admitting: Family Medicine

## 2011-07-02 VITALS — BP 130/82 | HR 64 | Temp 98.1°F | Ht 65.0 in | Wt 200.8 lb

## 2011-07-02 DIAGNOSIS — M25559 Pain in unspecified hip: Secondary | ICD-10-CM

## 2011-07-02 DIAGNOSIS — M25552 Pain in left hip: Secondary | ICD-10-CM

## 2011-07-02 DIAGNOSIS — Z0000 Encounter for general adult medical examination without abnormal findings: Secondary | ICD-10-CM

## 2011-07-02 DIAGNOSIS — M899 Disorder of bone, unspecified: Secondary | ICD-10-CM

## 2011-07-02 DIAGNOSIS — Z23 Encounter for immunization: Secondary | ICD-10-CM

## 2011-07-02 DIAGNOSIS — Z01419 Encounter for gynecological examination (general) (routine) without abnormal findings: Secondary | ICD-10-CM

## 2011-07-02 DIAGNOSIS — E559 Vitamin D deficiency, unspecified: Secondary | ICD-10-CM

## 2011-07-02 DIAGNOSIS — M949 Disorder of cartilage, unspecified: Secondary | ICD-10-CM

## 2011-07-02 DIAGNOSIS — B977 Papillomavirus as the cause of diseases classified elsewhere: Secondary | ICD-10-CM

## 2011-07-02 LAB — CBC WITH DIFFERENTIAL/PLATELET
Basophils Absolute: 0 10*3/uL (ref 0.0–0.1)
Eosinophils Absolute: 0.1 10*3/uL (ref 0.0–0.7)
Hemoglobin: 12.5 g/dL (ref 12.0–15.0)
Lymphocytes Relative: 31.3 % (ref 12.0–46.0)
MCHC: 34.1 g/dL (ref 30.0–36.0)
Neutro Abs: 3.1 10*3/uL (ref 1.4–7.7)
Neutrophils Relative %: 57.1 % (ref 43.0–77.0)
Platelets: 208 10*3/uL (ref 150.0–400.0)
RDW: 13.7 % (ref 11.5–14.6)

## 2011-07-02 LAB — LIPID PANEL
HDL: 84.5 mg/dL (ref >39.00)
Total CHOL/HDL Ratio: 2
VLDL: 10.8 mg/dL (ref 0.0–40.0)

## 2011-07-02 LAB — COMPREHENSIVE METABOLIC PANEL
ALT: 13 U/L (ref 0–35)
AST: 18 U/L (ref 0–37)
Calcium: 8.9 mg/dL (ref 8.4–10.5)
Chloride: 106 mEq/L (ref 96–112)
Creatinine, Ser: 0.7 mg/dL (ref 0.4–1.2)
Sodium: 139 mEq/L (ref 135–145)

## 2011-07-02 LAB — TSH: TSH: 1.34 u[IU]/mL (ref 0.35–5.50)

## 2011-07-02 NOTE — Patient Instructions (Addendum)
We will schedule bone density test at check out  Hip x ray today  Work on low impact exercise and healthy diet for weight loss  Has your flu shot today

## 2011-07-02 NOTE — Progress Notes (Signed)
Subjective:    Patient ID: Denise Ali, female    DOB: 06-26-1951, 60 y.o.   MRN: [id number]  HPI Here for health maintenance exam and to review chronic medical problems  And labs  Is feeling fine overall   Is having issue with her leg or hip L side - is new Started this past Thursday- got out of the car and hurt ,limited rom due to pain  Hurts to flex hip - hurts in outside and in the groin area  occ sitting hurts -- dull pain  No buttock pain or back pain    bp is 130/82 Wt is stable with bmi of 33 Has not been to the gym in a while - too busy and schedule  Has to be a priority Also not eating healthy lately - plans to change that   Due for labs-- ? 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 2010- jan - improved   Patient Active Problem List  Diagnoses  . HUMAN PAPILLOMAVIRUS  . NEOPLASM, SKIN, UNCERTAIN BEHAVIOR  . UNSPECIFIED VITAMIN D DEFICIENCY  . HYPOKALEMIA  . STRESS REACTION, ACUTE, WITH EMOTIONAL DISTURBANCE  . OSTEOPENIA  . EDEMA  . Tick bite  . Cough  . Irregular heart beat  . Routine general medical examination at a health care facility  . Gynecological examination  . Hip pain, left   Past Medical History  Diagnosis Date  . Edema   . 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 Family history of malignant neoplasm of breast     evista for BRCA ppx  . Hypopotassemia   . Neoplasm of uncertain behavior of skin   . Disorder of bone and cartilage, unspecified   . Other screening mammogram   . Routine  gynecological examination   . Predominant disturbance of emotions   . Unspecified vitamin D deficiency   . Colon polyp 12/2007  . Fibrocystic breast   . Bradycardia 9/12    mild - at one time with PACs/ asymptomatic    Past Surgical History  Procedure Date  . Tonsillectomy   . Leep     HPV  . Dexa 12/2003    osteopenia  . Dexa 11/07    stable  . Dexa 1/10    Osteopenia-slightly improved  . Breast biopsy 01/2010  . Breast mri 01/2010    Normal  . Breast biopsy 8/11    Fibrocystic change  . Cataracts 9/12    bilateral   History  Substance Use Topics  . Smoking status: Former Smoker    Quit date: 07/23/1985  . Smokeless tobacco: Not on file   Comment: Quit "years ago"  . Alcohol Use: Yes     Occasional   Family History  Problem Relation Age of Onset  . Diabetes Father   . Skin cancer Father     ?  Marland Kitchen Breast cancer Mother   . Osteoporosis  Mother   . Osteopenia Sister   . Breast cancer Sister   . Breast cancer      Grandmother  . Breast cancer      cousin  . Heart attack Paternal Grandfather    Allergies  Allergen Reactions  . Alendronate Sodium     REACTION: leg and joint pain   Current Outpatient Prescriptions on File Prior to Visit  Medication Sig Dispense Refill  . naproxen sodium (ANAPROX) 220 MG tablet Take 220 mg by mouth daily.        . raloxifene (EVISTA) 60 MG tablet Take 60 mg by mouth daily.        Marland Kitchen BROMDAY 0.09 % (DAILY) SOLN Place 1 drop into both eyes Daily.      . Calcium 1200-1000 MG-UNIT CHEW Chew 1 tablet by mouth daily.        . calcium carbonate (TUMS - DOSED IN MG ELEMENTAL CALCIUM) 500 MG chewable tablet Chew 2 tablets by mouth 2 (two) times daily.        . cholecalciferol (VITAMIN D) 1000 UNITS tablet Take 1,000 Units by mouth daily.        . diphenhydramine-acetaminophen (TYLENOL PM) 25-500 MG TABS Take 1 tablet by mouth at bedtime as needed.        . prednisoLONE acetate (PRED FORTE) 1 % ophthalmic suspension 1 drop 4 times a day in  right eye, 1 drop 3 times a day in left eye.      Marland Kitchen VIGAMOX 0.5 % ophthalmic solution Place 1 drop into the right eye Daily.         Review of Systems Review of Systems  Constitutional: Negative for fever, appetite change, fatigue and unexpected weight change.  Eyes: Negative for pain and visual disturbance.  Respiratory: Negative for cough and shortness of breath.   Cardiovascular: Negative for cp or palpitations    Gastrointestinal: Negative for nausea, diarrhea and constipation.  Genitourinary: Negative for urgency and frequency.  Skin: Negative for pallor or rash   MSK; pos for hip pain, neg for swollen or red joints  Neurological: Negative for weakness, light-headedness, numbness and headaches.  Hematological: Negative for adenopathy. Does not bruise/bleed easily.  Psychiatric/Behavioral: Negative for dysphoric mood. The patient is not nervous/anxious.          Objective:   Physical Exam  Constitutional: She appears well-developed and well-nourished. No distress.       overwt and well appearing   HENT:  Head: Normocephalic and atraumatic.  Right Ear: External ear normal.  Left Ear: External ear normal.  Nose: Nose normal.  Mouth/Throat: Oropharynx is clear and moist.  Eyes: Conjunctivae and EOM are normal. Pupils are equal, round, and reactive to light. No scleral icterus.  Neck: Normal range of motion. Neck supple. No JVD present. Carotid bruit is not present. No thyromegaly present.  Cardiovascular: Normal rate, regular rhythm, normal heart sounds and intact distal pulses.  Exam reveals no gallop.   Pulmonary/Chest: Effort normal and breath sounds normal. No respiratory distress. She has no wheezes. She exhibits no tenderness.  Abdominal: Soft. Bowel sounds are normal. She exhibits no distension and no mass. There is no tenderness.  Genitourinary: Vagina normal and uterus normal. No breast swelling, tenderness, discharge or bleeding. Uterus is not enlarged. Cervix exhibits  no motion tenderness, no discharge and no friability. Right adnexum displays no mass. Left adnexum displays no mass. No vaginal discharge found.       Breast exam: No mass, nodules, thickening, tenderness,  bulging, retraction, inflamation, nipple discharge or skin changes noted.  No axillary or clavicular LA.  Chaperoned exam.    Musculoskeletal: She exhibits tenderness. She exhibits no edema.       No LS tenderness Mild L greater trochanter tenderness Pain to internally and externally rotate hips - both but worse on L - pain described as in groin area  Nl SLR bilat Gait is somewhat labored   Lymphadenopathy:    She has no cervical adenopathy.  Neurological: She is alert. She has normal reflexes. No cranial nerve deficit. She exhibits normal muscle tone. Coordination normal.  Skin: Skin is warm and dry. No rash noted. No erythema. No pallor.  Psychiatric: She has a normal mood and affect.          Assessment & Plan:

## 2011-07-04 ENCOUNTER — Other Ambulatory Visit (HOSPITAL_COMMUNITY)
Admission: RE | Admit: 2011-07-04 | Discharge: 2011-07-04 | Disposition: A | Discharge status: Home / Self Care | Payer: 59 | Source: Ambulatory Visit | Attending: Family Medicine | Admitting: Family Medicine

## 2011-07-09 ENCOUNTER — Telehealth: Payer: Self-pay | Admitting: *Deleted

## 2011-07-09 NOTE — Telephone Encounter (Signed)
Pt states she called last week for results of Xray and request call back.

## 2011-07-09 NOTE — Progress Notes (Signed)
Patient notified as instructed by telephone. Health maintenance updated with pap info.vit D updated on med list as instructed.

## 2011-07-09 NOTE — Telephone Encounter (Signed)
Patient notified as instructed by telephone. NOte sent to Dr Milinda Antis for ortho referral. Pt will wait to hear from pt care coordinator.

## 2011-07-10 ENCOUNTER — Telehealth: Payer: Self-pay | Admitting: Family Medicine

## 2011-07-10 DIAGNOSIS — M25552 Pain in left hip: Secondary | ICD-10-CM

## 2011-07-10 NOTE — Telephone Encounter (Signed)
Will refer to ortho for hip oa

## 2011-07-10 NOTE — Telephone Encounter (Signed)
Message copied by Judy Pimple on Tue Jul 10, 2011  2:50 PM ------      Message from: Patience Musca      Created: Mon Jul 09, 2011  6:00 PM       Patient notified as instructed by telephone. Pt is agreeable for ortho consult. Pt would like to see female doctor in [place] if possible. Pt will wait to hear from pt care  Coordinator.

## 2011-07-20 ENCOUNTER — Inpatient Hospital Stay: Admission: RE | Admit: 2011-07-20 | Payer: 59 | Source: Ambulatory Visit

## 2011-09-24 ENCOUNTER — Telehealth: Payer: Self-pay | Admitting: Family Medicine

## 2011-09-24 NOTE — Telephone Encounter (Signed)
Has appt scheduled.

## 2011-09-24 NOTE — Telephone Encounter (Signed)
Triage Record Num: [id number] Operator: Jeraldine Loots Patient Name: Denise Ali Call Date & Time: 09/24/2011 2:55:23PM Patient Phone: [phone] PCP: Audrie Gallus. [facility] Patient Gender: Female PCP Fax : Patient DOB: 1951-02-08 Practice Name: [facility] Reason for Call: Caller: [name]/Patient; PCP: [name] A.; CB#: [phone]; ; ; Call regarding h/a, sore throat, congestion. Having chills but doesn't have a thermometer. Has been exposed to the flu. Has increased facial fullness. Needs to be seen in 24h ours. Scheduled for 10a Tuesday, 3/5 with Dr. Dayton Martes. Protocol(s) Used: Flu-Like Symptoms Recommended Outcome per Protocol: See Provider within 24 hours Reason for Outcome: Facial pain (fullness, pressure, worsens with bending over), frontal headache, yellow-green nasal discharge AND any temperature elevation Care Advice: ~ 09/24/2011 3:04:32PM Page 1 of 1 [email or web address]

## 2011-09-25 ENCOUNTER — Ambulatory Visit (INDEPENDENT_AMBULATORY_CARE_PROVIDER_SITE_OTHER): Payer: 59 | Admitting: Family Medicine

## 2011-09-25 ENCOUNTER — Encounter: Payer: Self-pay | Admitting: Family Medicine

## 2011-09-25 VITALS — BP 120/60 | HR 76 | Temp 98.3°F | Wt 208.0 lb

## 2011-09-25 DIAGNOSIS — J069 Acute upper respiratory infection, unspecified: Secondary | ICD-10-CM

## 2011-09-25 NOTE — Patient Instructions (Signed)
I hope you feel better soon. This is likely a virus. Drink lots of fluids.  Treat sympotmatically with Mucinex, nasal saline irrigation, and Tylenol/Ibuprofen. Also try claritin D or zyrtec D over the counter- two times a day as needed ( have to sign for them at pharmacy). You can use warm compresses.  Cough suppressant at night. Call if not improving as expected in 5-7 days.

## 2011-09-25 NOTE — Progress Notes (Signed)
SUBJECTIVE:  Denise Ali is a 61 y.o. female who complains of coryza, congestion, sneezing and sore throat for 3 days. She denies a history of anorexia, chest pain, chills, dizziness, nausea, shortness of breath, sweats, vomiting, weakness and wheezing and denies a history of asthma. Patient denies smoke cigarettes.   Patient Active Problem List  Diagnoses  . HUMAN PAPILLOMAVIRUS  . NEOPLASM, SKIN, UNCERTAIN BEHAVIOR  . UNSPECIFIED VITAMIN D DEFICIENCY  . HYPOKALEMIA  . STRESS REACTION, ACUTE, WITH EMOTIONAL DISTURBANCE  . OSTEOPENIA  . EDEMA  . Tick bite  . Cough  . Irregular heart beat  . Routine general medical examination at a health care facility  . Gynecological examination  . Hip pain, left   Past Medical History  Diagnosis Date  . Edema   . 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 Family history of malignant neoplasm of breast     evista for BRCA ppx  . Hypopotassemia   . Neoplasm of uncertain behavior of skin   . Disorder of bone and cartilage, unspecified   . Other screening mammogram   . Routine gynecological examination   . Predominant disturbance of emotions   . Unspecified vitamin D deficiency   . Colon polyp 12/2007  . Fibrocystic breast   . Bradycardia 9/12    mild - at one time with PACs/ asymptomatic    Past Surgical History  Procedure Date  . Tonsillectomy   . Leep     HPV  . Dexa 12/2003    osteopenia  . Dexa 11/07    stable  . Dexa 1/10    Osteopenia-slightly improved  . Breast biopsy 01/2010  . Breast mri 01/2010    Normal  . Breast biopsy 8/11    Fibrocystic change  . Cataracts 9/12    bilateral   History  Substance Use Topics  . Smoking status: Former Smoker    Quit date: 07/23/1985  . Smokeless tobacco: Not on file   Comment: Quit "years ago"  . Alcohol Use: Yes     Occasional   Family History  Problem Relation Age of Onset  . Diabetes Father   . Skin cancer Father     ?  Marland Kitchen  Breast cancer Mother   . Osteoporosis Mother   . Osteopenia Sister   . Breast cancer Sister   . Breast cancer      Grandmother  . Breast cancer      cousin  . Heart attack Paternal Grandfather    Allergies  Allergen Reactions  . Alendronate Sodium     REACTION: leg and joint pain   Current Outpatient Prescriptions on File Prior to Visit  Medication Sig Dispense Refill  . Calcium 1200-1000 MG-UNIT CHEW Chew 1 tablet by mouth daily.        . calcium carbonate (TUMS - DOSED IN MG ELEMENTAL CALCIUM) 500 MG chewable tablet Chew 2 tablets by mouth 2 (two) times daily.        . cholecalciferol (VITAMIN D) 1000 UNITS tablet Take 4,000 Units by mouth daily.       . diphenhydramine-acetaminophen (TYLENOL PM) 25-500 MG TABS Take 1 tablet by mouth at bedtime as needed.        . naproxen sodium (ANAPROX) 220 MG tablet Take 220 mg by mouth daily.        . raloxifene (EVISTA) 60 MG tablet Take 60 mg by mouth daily.        Marland Kitchen BROMDAY  0.09 % (DAILY) SOLN Place 1 drop into both eyes Daily.      . prednisoLONE acetate (PRED FORTE) 1 % ophthalmic suspension 1 drop 4 times a day in right eye, 1 drop 3 times a day in left eye.      Marland Kitchen VIGAMOX 0.5 % ophthalmic solution Place 1 drop into the right eye Daily.       The PMH, PSH, Social History, Family History, Medications, and allergies have been reviewed in [facility], and have been updated if relevant.  OBJECTIVE: BP 120/60  Pulse 76  Temp(Src) 98.3 F (36.8 C) (Oral)  Wt 208 lb (94.348 kg)  She appears well, vital signs are as noted. Ears normal.  Throat and pharynx normal.  Neck supple. No adenopathy in the neck. Nose is congested. Sinuses non tender. The chest is clear, without wheezes or rales.  ASSESSMENT:  viral upper respiratory illness  PLAN: Symptomatic therapy suggested: push fluids, rest and return office visit prn if symptoms persist or worsen. Lack of antibiotic effectiveness discussed with her. Call or return to clinic prn if these symptoms  worsen or fail to improve as anticipated.

## 2011-10-18 ENCOUNTER — Encounter: Payer: Self-pay | Admitting: Oncology

## 2011-10-18 ENCOUNTER — Ambulatory Visit (HOSPITAL_BASED_OUTPATIENT_CLINIC_OR_DEPARTMENT_OTHER): Payer: 59 | Admitting: Oncology

## 2011-10-18 ENCOUNTER — Other Ambulatory Visit (HOSPITAL_BASED_OUTPATIENT_CLINIC_OR_DEPARTMENT_OTHER): Payer: 59 | Admitting: Lab

## 2011-10-18 VITALS — BP 148/77 | HR 71 | Temp 98.5°F | Ht 65.0 in | Wt 205.0 lb

## 2011-10-18 DIAGNOSIS — M129 Arthropathy, unspecified: Secondary | ICD-10-CM

## 2011-10-18 DIAGNOSIS — N6019 Diffuse cystic mastopathy of unspecified breast: Secondary | ICD-10-CM

## 2011-10-18 DIAGNOSIS — Z803 Family history of malignant neoplasm of breast: Secondary | ICD-10-CM

## 2011-10-18 DIAGNOSIS — Z1501 Genetic susceptibility to malignant neoplasm of breast: Secondary | ICD-10-CM

## 2011-10-18 DIAGNOSIS — N951 Menopausal and female climacteric states: Secondary | ICD-10-CM

## 2011-10-18 DIAGNOSIS — R5381 Other malaise: Secondary | ICD-10-CM

## 2011-10-18 DIAGNOSIS — Z808 Family history of malignant neoplasm of other organs or systems: Secondary | ICD-10-CM

## 2011-10-18 DIAGNOSIS — Z1239 Encounter for other screening for malignant neoplasm of breast: Secondary | ICD-10-CM

## 2011-10-18 HISTORY — DX: Encounter for other screening for malignant neoplasm of breast: Z12.39

## 2011-10-18 LAB — CBC WITH DIFFERENTIAL/PLATELET
Basophils Absolute: 0 10*3/uL (ref 0.0–0.1)
Eosinophils Absolute: 0.1 10*3/uL (ref 0.0–0.5)
HCT: 36.8 % (ref 34.8–46.6)
HGB: 12.6 g/dL (ref 11.6–15.9)
MCV: 91.8 fL (ref 79.5–101.0)
MONO%: 9.3 % (ref 0.0–14.0)
NEUT#: 2.9 10*3/uL (ref 1.5–6.5)
Platelets: 224 10*3/uL (ref 145–400)
RDW: 13.6 % (ref 11.2–14.5)

## 2011-10-18 LAB — COMPREHENSIVE METABOLIC PANEL
Albumin: 3.9 g/dL (ref 3.5–5.2)
Alkaline Phosphatase: 60 U/L (ref 39–117)
BUN: 16 mg/dL (ref 6–23)
Calcium: 8.8 mg/dL (ref 8.4–10.5)
Glucose, Bld: 104 mg/dL — ABNORMAL HIGH (ref 70–99)
Potassium: 4.2 mEq/L (ref 3.5–5.3)

## 2011-10-18 MED ORDER — RALOXIFENE HCL 60 MG PO TABS: 60.0000 mg | ORAL_TABLET | Freq: Every day | ORAL | Status: DC

## 2011-10-18 NOTE — Patient Instructions (Signed)
1. Continue evista daily, prescription sent to pharmacy  2. MRI for yearly screening ordered  3. Follow up in 1 year

## 2011-10-30 NOTE — Progress Notes (Signed)
OFFICE PROGRESS NOTE  CC  [name], MD, MD [address] [place] [address] [place] [address]  DIAGNOSIS: 61 year old female with family history of breast cancer on Evista for breast cancer risk reduction.  PRIOR THERAPY:  #1 patient has been having breast MRIs performed on a yearly basis.   #2 Evista 60 mg daily.But patient has not taken Evista for quite some time.   #3 left hip pain  Patient has had cortisone injections done to  CURRENT THERAPY:Evista 60 mg daily but patient is not taking currently.  INTERVAL HISTORY: Denise Ali 61 y.o. female returns for Followup visit. Overall she's doing well. She has been having some hip pain in the left. She has received cortisone injections for this with some relief. She has not been on Evista. She does state that it caused hot flashes off-and-on but not too much. She otherwise denies any fevers chills night sweats headaches she has no shortness of breath no chest pains. Patient also understands that not taking Evista she does have a risk of developing breast cancer. Her lifetime risk as, calculated by tyrer cusick model was 29.49% and this is the reason why we recommended  starting on Evista. She also has a very strong family history.  MEDICAL HISTORY: Past Medical History  Diagnosis Date  . Edema   . 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 Family history of malignant neoplasm of breast     evista for BRCA ppx  . Hypopotassemia   . Neoplasm of uncertain behavior of skin   . Disorder of bone and cartilage, unspecified   . Other screening mammogram   . Routine gynecological examination   . Predominant disturbance of emotions   . Unspecified vitamin D deficiency   . Colon polyp 12/2007  . Fibrocystic breast   . Bradycardia 9/12    mild - at one time with PACs/ asymptomatic   . Breast cancer screening, high risk patient 10/18/2011    ALLERGIES:  is allergic to  alendronate sodium.  MEDICATIONS:  Current Outpatient Prescriptions  Medication Sig Dispense Refill  . BROMDAY 0.09 % (DAILY) SOLN Place 1 drop into both eyes Daily.      . Calcium 1200-1000 MG-UNIT CHEW Chew 1 tablet by mouth daily.        . calcium carbonate (TUMS - DOSED IN MG ELEMENTAL CALCIUM) 500 MG chewable tablet Chew 2 tablets by mouth 2 (two) times daily.        . cholecalciferol (VITAMIN D) 1000 UNITS tablet Take 4,000 Units by mouth daily.       . diphenhydramine-acetaminophen (TYLENOL PM) 25-500 MG TABS Take 1 tablet by mouth at bedtime as needed.        . naproxen sodium (ANAPROX) 220 MG tablet Take 220 mg by mouth daily.        . prednisoLONE acetate (PRED FORTE) 1 % ophthalmic suspension 1 drop 4 times a day in right eye, 1 drop 3 times a day in left eye.      . raloxifene (EVISTA) 60 MG tablet Take 60 mg by mouth daily.        Marland Kitchen VIGAMOX 0.5 % ophthalmic solution Place 1 drop into the right eye Daily.      . raloxifene (EVISTA) 60 MG tablet Take 1 tablet (60 mg total) by mouth daily.  90 tablet  12    SURGICAL HISTORY:  Past Surgical History  Procedure Date  . Tonsillectomy   .  Leep     HPV  . Dexa 12/2003    osteopenia  . Dexa 11/07    stable  . Dexa 1/10    Osteopenia-slightly improved  . Breast biopsy 01/2010  . Breast mri 01/2010    Normal  . Breast biopsy 8/11    Fibrocystic change  . Cataracts 9/12    bilateral    REVIEW OF SYSTEMS:  Pertinent items are noted in HPI.   PHYSICAL EXAMINATION: General appearance: alert, cooperative and appears stated age Lymph nodes: Cervical, supraclavicular, and axillary nodes normal. Resp: clear to auscultation bilaterally and normal percussion bilaterally Back: symmetric, no curvature. ROM normal. No CVA tenderness. Cardio: regular rate and rhythm, S1, S2 normal, no murmur, click, rub or gallop GI: soft, non-tender; bowel sounds normal; no masses,  no organomegaly Extremities: extremities normal, atraumatic, no  cyanosis or edema Neurologic: Grossly normal  ECOG PERFORMANCE STATUS: 0 - Asymptomatic  Blood pressure 148/77, pulse 71, temperature 98.5 F (36.9 C), temperature source Oral, height 5\' 5"  (1.651 m), weight 205 lb (92.987 kg).  LABORATORY DATA: Lab Results  Component Value Date   WBC 4.8 10/18/2011   HGB 12.6 10/18/2011   HCT 36.8 10/18/2011   MCV 91.8 10/18/2011   PLT 224 10/18/2011      Chemistry      Component Value Date/Time   NA 140 10/18/2011 0859   NA 140 04/13/2010 1404   K 4.2 10/18/2011 0859   K 4.6 04/13/2010 1404   CL 105 10/18/2011 0859   CL 99 04/13/2010 1404   CO2 26 10/18/2011 0859   CO2 29 04/13/2010 1404   BUN 16 10/18/2011 0859   BUN 15 04/13/2010 1404   CREATININE 0.71 10/18/2011 0859   CREATININE 0.6 04/13/2010 1404      Component Value Date/Time   CALCIUM 8.8 10/18/2011 0859   CALCIUM 9.3 04/13/2010 1404   ALKPHOS 60 10/18/2011 0859   ALKPHOS 59 04/13/2010 1404   AST 14 10/18/2011 0859   AST 19 04/13/2010 1404   ALT 13 10/18/2011 0859   BILITOT 0.6 10/18/2011 0859   BILITOT 0.80 04/13/2010 1404       RADIOGRAPHIC STUDIES:  No results found.  ASSESSMENT: 61 year old female with:  1.  lifetime risk of developing breast cancer 29% patient has been on breast cancer risk reduction therapy consisting of Evista 60 mg daily. Unfortunately she has not taken it.  2. Arthritis   PLAN:   #1 I have recommended that patient restart Evista and she will consider this.  #2 she does need to be scheduled for breast MRI  so we'll get this scheduled.  #3 she will be seen back in one years time or sooner if need arises.   All questions were answered. The patient knows to call the clinic with any problems, questions or concerns. We can certainly see the patient much sooner if necessary.  I spent 25 minutes counseling the patient face to face. The total time spent in the appointment was 30 minutes.    Drue Second, MD Medical/Oncology [facility] [phone] (beeper) [phone] (Office)  10/30/2011, 5:35 PM

## 2011-11-08 ENCOUNTER — Telehealth: Payer: Self-pay | Admitting: *Deleted

## 2011-11-08 NOTE — Telephone Encounter (Signed)
left voice message informing the patient of the new date and time of the mammogram at [facility] on 01-31-2012 at 11:00am mri of the breast is schedulded for 02-04-2012 at 12:00 at the [facility] add lab on for 11:30am

## 2011-11-08 NOTE — Telephone Encounter (Signed)
patient needs to have an mammogram order before she can have the mri of the breast done will inform the md of the issue

## 2012-02-01 ENCOUNTER — Encounter: Payer: Self-pay | Admitting: Family Medicine

## 2012-02-04 ENCOUNTER — Ambulatory Visit (HOSPITAL_COMMUNITY)
Admission: RE | Admit: 2012-02-04 | Discharge: 2012-02-04 | Disposition: A | Discharge status: Home / Self Care | Payer: 59 | Source: Ambulatory Visit | Attending: Oncology | Admitting: Oncology

## 2012-02-04 DIAGNOSIS — K7689 Other specified diseases of liver: Secondary | ICD-10-CM | POA: Insufficient documentation

## 2012-02-04 DIAGNOSIS — Z803 Family history of malignant neoplasm of breast: Secondary | ICD-10-CM | POA: Insufficient documentation

## 2012-02-04 DIAGNOSIS — Z1239 Encounter for other screening for malignant neoplasm of breast: Secondary | ICD-10-CM

## 2012-02-04 LAB — CREATININE, SERUM
Creatinine, Ser: 0.73 mg/dL (ref 0.50–1.10)
GFR calc Af Amer: >90 mL/min (ref >90)
GFR calc non Af Amer: >90 mL/min (ref >90)

## 2012-02-04 MED ORDER — GADOBENATE DIMEGLUMINE 529 MG/ML IV SOLN
20.0000 mL | Freq: Once | INTRAVENOUS | Status: AC | PRN
  Administered 2012-02-04: 20 mL via INTRAVENOUS

## 2012-05-09 ENCOUNTER — Ambulatory Visit (INDEPENDENT_AMBULATORY_CARE_PROVIDER_SITE_OTHER): Payer: 59 | Admitting: Family Medicine

## 2012-05-09 ENCOUNTER — Encounter: Payer: Self-pay | Admitting: Family Medicine

## 2012-05-09 VITALS — BP 126/78 | HR 64 | Temp 98.4°F | Ht 65.0 in | Wt 204.0 lb

## 2012-05-09 DIAGNOSIS — B029 Zoster without complications: Secondary | ICD-10-CM | POA: Insufficient documentation

## 2012-05-09 MED ORDER — VALACYCLOVIR HCL 1 G PO TABS: 1000.0000 mg | ORAL_TABLET | Freq: Three times a day (TID) | ORAL | Status: DC

## 2012-05-09 MED ORDER — HYDROCODONE-ACETAMINOPHEN 5-500 MG PO TABS: 1.0000 | ORAL_TABLET | Freq: Four times a day (QID) | ORAL | Status: DC | PRN

## 2012-05-09 NOTE — Patient Instructions (Addendum)
Take the valtrex as directed and finish it all  Take Vicodin with caution for pain  Stay away from sick or immunocompromised people

## 2012-05-09 NOTE — Assessment & Plan Note (Signed)
2 clusters of vesicles (small) under L arm  Will start valtrex 1 g tid for 7 days vicodin for pain  Disc avoiding immunocomp people re: contagiousess Will keep covered and clean Update if no improvement Handouts given  Will need to disc zoster vaccine in the future

## 2012-05-09 NOTE — Progress Notes (Signed)
Subjective:    Patient ID: Denise Ali Reasons, female    DOB: [birth year], 61 y.o.   MRN: [id number]  HPI Has a rash under L arm area -- itches and burns and radiates to the back For 2 days  Feels ok  No fever  No other rash No known insect bites or exp to poison ivy No new products Has not been around anyone with zoster   Patient Active Problem List  Diagnosis  . HUMAN PAPILLOMAVIRUS  . NEOPLASM, SKIN, UNCERTAIN BEHAVIOR  . UNSPECIFIED VITAMIN D DEFICIENCY  . HYPOKALEMIA  . STRESS REACTION, ACUTE, WITH EMOTIONAL DISTURBANCE  . OSTEOPENIA  . EDEMA  . Tick bite  . Cough  . Irregular heart beat  . Routine general medical examination at a health care facility  . Gynecological examination  . Hip pain, left  . Breast cancer screening, high risk patient   Past Medical History  Diagnosis Date  . Edema   . 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 Family history of malignant neoplasm of breast     evista for BRCA ppx  . Hypopotassemia   . Neoplasm of uncertain behavior of skin   . Disorder of bone and cartilage, unspecified   . Other screening mammogram   . Routine gynecological examination   . Predominant disturbance of emotions   . Unspecified vitamin D deficiency   . Colon polyp 12/2007  . Fibrocystic breast   . Bradycardia 9/12    mild - at one time with PACs/ asymptomatic   . Breast cancer screening, high risk patient 10/18/2011   Past Surgical History  Procedure Date  . Tonsillectomy   . Leep     HPV  . Dexa 12/2003    osteopenia  . Dexa 11/07    stable  . Dexa 1/10    Osteopenia-slightly improved  . Breast biopsy 01/2010  . Breast mri 01/2010    Normal  . Breast biopsy 8/11    Fibrocystic change  . Cataracts 9/12    bilateral   History  Substance Use Topics  . Smoking status: Former Smoker    Quit date: 07/23/1985  . Smokeless tobacco: Not on file   Comment: Quit "years ago"  . Alcohol Use: Yes   Occasional   Family History  Problem Relation Age of Onset  . Diabetes Father   . Skin cancer Father     ?  Marland Kitchen Breast cancer Mother   . Osteoporosis Mother   . Osteopenia Sister   . Breast cancer Sister   . Breast cancer      Grandmother  . Breast cancer      cousin  . Heart attack Paternal Grandfather    Allergies  Allergen Reactions  . Alendronate Sodium     REACTION: leg and joint pain   Current Outpatient Prescriptions on File Prior to Visit  Medication Sig Dispense Refill  . Calcium 1200-1000 MG-UNIT CHEW Chew 1 tablet by mouth daily.        . calcium carbonate (TUMS - DOSED IN MG ELEMENTAL CALCIUM) 500 MG chewable tablet Chew 2 tablets by mouth as needed.       . naproxen sodium (ANAPROX) 220 MG tablet Take 220 mg by mouth daily.        . raloxifene (EVISTA) 60 MG tablet Take 60 mg by mouth daily.            Review of Systems Review of Systems  Constitutional: Negative  for fever, appetite change, fatigue and unexpected weight change.  Eyes: Negative for pain and visual disturbance.  Respiratory: Negative for cough and shortness of breath.   Cardiovascular: Negative for cp or palpitations    Gastrointestinal: Negative for nausea, diarrhea and constipation.  Genitourinary: Negative for urgency and frequency.  Skin: Negative for pallor and pos for rash that does not weep  Neurological: Negative for weakness, light-headedness, numbness and headaches.  Hematological: Negative for adenopathy. Does not bruise/bleed easily.  Psychiatric/Behavioral: Negative for dysphoric mood. The patient is not nervous/anxious.         Objective:   Physical Exam  Constitutional: She appears well-developed and well-nourished. No distress.       obese and well appearing   HENT:  Head: Normocephalic and atraumatic.  Eyes: EOM are normal. Pupils are equal, round, and reactive to light. Right eye exhibits no discharge. Left eye exhibits no discharge.  Neck: Normal range of motion. Neck  supple.  Cardiovascular: Normal rate and regular rhythm.   Pulmonary/Chest: Effort normal and breath sounds normal.  Lymphadenopathy:    She has no cervical adenopathy.  Neurological: She is alert. She has normal reflexes.  Skin: Skin is warm and dry. Rash noted. There is erythema.       2 patches of erythema and vesicles in area under L arm consistent with zoster This is tender to the touch No discharge or weeping   Psychiatric: She has a normal mood and affect.          Assessment & Plan:

## 2012-05-16 ENCOUNTER — Telehealth: Payer: Self-pay | Admitting: Family Medicine

## 2012-05-16 NOTE — Telephone Encounter (Signed)
Agree with above  F/u if needed and no further improvement

## 2012-05-16 NOTE — Telephone Encounter (Signed)
Pt notified Dr. Karie Schwalbe agrees with CAN note and to f/u with [place] if no improvement

## 2012-05-16 NOTE — Telephone Encounter (Signed)
Caller: Lakindra/Patient; Patient Name: Denise Ali; PCP: [name] [facility]); Best Callback Phone Number: [phone]. Caller reports she was seen in the office on Friday 10/18 and diagnosed with shingles. No new blisters since she was seen. Caller has completed the Valtrex as directed but she is still having the knife-like, stabbing pain. Taking pain medication only at bedtime, due to sedation. She has stayed at home from work today. Caller asking for direction. Caller given information per CAN Info files and advised the lingering pain is normal for shingles. RN suggested she try taking 1/2 of Vicodin until she can build up a tolerance and can take whole pill without sedation. She is agreeable. All questions answered. Caller would like callback from PCP/office staff. Caller advised a note will be sent with her request.

## 2012-05-29 ENCOUNTER — Encounter: Payer: Self-pay | Admitting: Family Medicine

## 2012-05-29 ENCOUNTER — Ambulatory Visit (INDEPENDENT_AMBULATORY_CARE_PROVIDER_SITE_OTHER): Payer: 59 | Admitting: Family Medicine

## 2012-05-29 VITALS — BP 126/74 | HR 80 | Temp 98.2°F | Wt 204.0 lb

## 2012-05-29 DIAGNOSIS — B029 Zoster without complications: Secondary | ICD-10-CM

## 2012-05-29 MED ORDER — VALACYCLOVIR HCL 1 G PO TABS: 1000.0000 mg | ORAL_TABLET | Freq: Three times a day (TID) | ORAL | Status: DC

## 2012-05-29 MED ORDER — HYDROCODONE-ACETAMINOPHEN 5-500 MG PO TABS: 1.0000 | ORAL_TABLET | Freq: Four times a day (QID) | ORAL | Status: DC | PRN

## 2012-05-29 NOTE — Patient Instructions (Addendum)
Restart the valtrex and use the hydrocodone for pain.  See Dr. Milinda Antis next week.  Avoid contact with immunocompromised people and young children for the next few days.

## 2012-05-29 NOTE — Progress Notes (Signed)
She had shingles dx'd 10/13 on L axilla.  Treated with valtrex and hydrocodone.  Pain is partially improved.  Still with some burning and itching, taking hydrocodone at night to sleep.  Rash is mostly resolved. Still with paresthesias in the affected area.    She has a change in sensation on L upper and lower eyelids, nothing on R side.  Also with 3 small faintly red lesions on the L temple.  Change in sensation on the L side of the bridge of nose compared to R side.  All noted in last 24 hours.    No eye pain.  No vision changes.    Meds, vitals, and allergies reviewed.   ROS: See HPI.  Otherwise, noncontributory.  nad ncat Tm wnl, nasal and OP exam wnl.  Skin on face wnl except for 3 small faintly red lesions on the L temple and mild sensory dec in L V1 distribution, L side of the bridge of nose compared to R side. rrr ctab Healing rash notes on L axilla, chest wall from prev zoster. PERRL, EOMI, conjunctiva wnl B L eye stains wnl, no abnormal corneal dye uptake

## 2012-05-30 NOTE — Assessment & Plan Note (Signed)
Presumed 2nd episode w/o eye involvement.  If vision changes, then to eye clinic.  Restart valtrex, use vicodin prn for pain.  F/u with PCP next week.  She agrees.

## 2012-06-04 ENCOUNTER — Ambulatory Visit: Payer: 59 | Admitting: Family Medicine

## 2012-06-06 ENCOUNTER — Encounter: Payer: Self-pay | Admitting: Family Medicine

## 2012-06-06 ENCOUNTER — Ambulatory Visit (INDEPENDENT_AMBULATORY_CARE_PROVIDER_SITE_OTHER): Payer: 59 | Admitting: Family Medicine

## 2012-06-06 VITALS — BP 136/78 | HR 78 | Temp 98.5°F | Ht 65.0 in | Wt 204.2 lb

## 2012-06-06 DIAGNOSIS — B029 Zoster without complications: Secondary | ICD-10-CM

## 2012-06-06 NOTE — Assessment & Plan Note (Signed)
Rash is totally resolved I suspect that the small papule on L cheek and under L arm are actually clogged pores-not vesicles  Will finish valtrex  Offered gabapentin if needed in future Will update  Aware if any vision change to update asap

## 2012-06-06 NOTE — Patient Instructions (Addendum)
Finish your valtrex and take pain medicine as needed  If your pain remains and you want to try a regular medicine - on schedule (gabapentin) - let me know  If vision changes please call immediately

## 2012-06-06 NOTE — Progress Notes (Signed)
Subjective:    Patient ID: Denise Ali, female    DOB: 1951-05-02, 61 y.o.   MRN: [id number]  HPI Zoster came back around L eye - never had any vision change from it  Here for f/u of that  Area under arm is overall improved - pain remains - then thought she had one more spot on cheek-L near corner of mouth Otherwise the rash is gone   Has almost finished 2nd course of valtrex Is trying not to take the pain med (vicodin) unless abs necessary     Patient Active Problem List  Diagnosis  . HUMAN PAPILLOMAVIRUS  . NEOPLASM, SKIN, UNCERTAIN BEHAVIOR  . UNSPECIFIED VITAMIN D DEFICIENCY  . HYPOKALEMIA  . STRESS REACTION, ACUTE, WITH EMOTIONAL DISTURBANCE  . OSTEOPENIA  . EDEMA  . Tick bite  . Cough  . Irregular heart beat  . Routine general medical examination at a health care facility  . Gynecological examination  . Hip pain, left  . Breast cancer screening, high risk patient  . Herpes zoster   Past Medical History  Diagnosis Date  . Edema   . 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 Family history of malignant neoplasm of breast     evista for BRCA ppx  . Hypopotassemia   . Neoplasm of uncertain behavior of skin   . Disorder of bone and cartilage, unspecified   . Other screening mammogram   . Routine gynecological examination   . Predominant disturbance of emotions   . Unspecified vitamin D deficiency   . Colon polyp 12/2007  . Fibrocystic breast   . Bradycardia 9/12    mild - at one time with PACs/ asymptomatic   . Breast cancer screening, high risk patient 10/18/2011   Past Surgical History  Procedure Date  . Tonsillectomy   . Leep     HPV  . Dexa 12/2003    osteopenia  . Dexa 11/07    stable  . Dexa 1/10    Osteopenia-slightly improved  . Breast biopsy 01/2010  . Breast mri 01/2010    Normal  . Breast biopsy 8/11    Fibrocystic change  . Cataracts 9/12    bilateral   History  Substance Use Topics  .  Smoking status: Former Smoker    Quit date: 07/23/1985  . Smokeless tobacco: Not on file     Comment: Quit "years ago"  . Alcohol Use: Yes     Comment: Occasional   Family History  Problem Relation Age of Onset  . Diabetes Father   . Skin cancer Father     ?  Marland Kitchen Breast cancer Mother   . Osteoporosis Mother   . Osteopenia Sister   . Breast cancer Sister   . Breast cancer      Grandmother  . Breast cancer      cousin  . Heart attack Paternal Grandfather    Allergies  Allergen Reactions  . Alendronate Sodium     REACTION: leg and joint pain   Current Outpatient Prescriptions on File Prior to Visit  Medication Sig Dispense Refill  . acetaminophen (TYLENOL) 650 MG CR tablet Take 650 mg by mouth as needed.      . Calcium 1200-1000 MG-UNIT CHEW Chew 1 tablet by mouth daily.        . calcium carbonate (TUMS - DOSED IN MG ELEMENTAL CALCIUM) 500 MG chewable tablet Chew 2 tablets by mouth as needed.       Marland Kitchen  HYDROcodone-acetaminophen (VICODIN) 5-500 MG per tablet Take 1 tablet by mouth every 6 (six) hours as needed for pain.  30 tablet  0  . naproxen sodium (ANAPROX) 220 MG tablet Take 220 mg by mouth daily.        . raloxifene (EVISTA) 60 MG tablet Take 60 mg by mouth daily.        . valACYclovir (VALTREX) 1000 MG tablet Take 1 tablet (1,000 mg total) by mouth 3 (three) times daily.  21 tablet  0      Review of Systems  Review of Systems  Constitutional: Negative for fever, appetite change, fatigue and unexpected weight change.  Eyes: Negative for pain and visual disturbance.  Respiratory: Negative for cough and shortness of breath.   Cardiovascular: Negative for cp or palpitations    Gastrointestinal: Negative for nausea, diarrhea and constipation.  Genitourinary: Negative for urgency and frequency.  Skin: Negative for pallor or redness/ rash is resolved , pos for pain in prev areas of zoster  Neurological: Negative for weakness, light-headedness, numbness and headaches.    Hematological: Negative for adenopathy. Does not bruise/bleed easily.  Psychiatric/Behavioral: Negative for dysphoric mood. The patient is not nervous/anxious.       Objective:   Physical Exam  Constitutional: She appears well-developed and well-nourished. No distress.  HENT:  Head: Normocephalic and atraumatic.  Mouth/Throat: Oropharynx is clear and moist.  Eyes: Conjunctivae normal and EOM are normal. Pupils are equal, round, and reactive to light. Right eye exhibits no discharge. Left eye exhibits no discharge. No scleral icterus.  Neck: Normal range of motion. Neck supple. No JVD present. Carotid bruit is not present. No thyromegaly present.  Cardiovascular: Normal rate and regular rhythm.   Pulmonary/Chest: Effort normal and breath sounds normal.  Abdominal: She exhibits no abdominal bruit.  Musculoskeletal: She exhibits no edema and no tenderness.  Lymphadenopathy:    She has no cervical adenopathy.  Neurological: She is alert. She has normal reflexes. No cranial nerve deficit. She exhibits normal muscle tone. Coordination normal.  Skin: Skin is warm and dry. No erythema. No pallor.       Rash on face is resolved Erythematous papule near L corner of mouth and under L arm appear to be comedones No vesicles   Psychiatric: She has a normal mood and affect.          Assessment & Plan:

## 2012-09-23 ENCOUNTER — Telehealth: Payer: Self-pay | Admitting: Family Medicine

## 2012-09-23 DIAGNOSIS — M949 Disorder of cartilage, unspecified: Secondary | ICD-10-CM

## 2012-09-23 DIAGNOSIS — Z0000 Encounter for general adult medical examination without abnormal findings: Secondary | ICD-10-CM

## 2012-09-23 DIAGNOSIS — E559 Vitamin D deficiency, unspecified: Secondary | ICD-10-CM

## 2012-09-23 NOTE — Telephone Encounter (Signed)
Message copied by Judy Pimple on Tue Sep 23, 2012 10:05 PM ------      Message from: Alvina Chou      Created: Tue Sep 16, 2012  2:21 PM      Regarding: Lab orders for Wednesday, 3.5.14       Patient is scheduled for CPX labs, please order future labs, Thanks , Terri       ------

## 2012-09-24 ENCOUNTER — Other Ambulatory Visit (INDEPENDENT_AMBULATORY_CARE_PROVIDER_SITE_OTHER): Payer: 59

## 2012-09-24 DIAGNOSIS — Z0000 Encounter for general adult medical examination without abnormal findings: Secondary | ICD-10-CM

## 2012-09-24 DIAGNOSIS — M949 Disorder of cartilage, unspecified: Secondary | ICD-10-CM

## 2012-09-24 DIAGNOSIS — E559 Vitamin D deficiency, unspecified: Secondary | ICD-10-CM

## 2012-09-24 LAB — CBC WITH DIFFERENTIAL/PLATELET
Basophils Relative: 0.5 % (ref 0.0–3.0)
Eosinophils Absolute: 0.1 10*3/uL (ref 0.0–0.7)
Lymphocytes Relative: 27.9 % (ref 12.0–46.0)
MCHC: 33.8 g/dL (ref 30.0–36.0)
Neutrophils Relative %: 60.8 % (ref 43.0–77.0)
RBC: 4.31 Mil/uL (ref 3.87–5.11)
WBC: 5.4 10*3/uL (ref 4.5–10.5)

## 2012-09-24 LAB — COMPREHENSIVE METABOLIC PANEL
ALT: 12 U/L (ref 0–35)
AST: 17 U/L (ref 0–37)
Albumin: 3.8 g/dL (ref 3.5–5.2)
BUN: 17 mg/dL (ref 6–23)
Calcium: 9.3 mg/dL (ref 8.4–10.5)
Chloride: 103 mEq/L (ref 96–112)
Potassium: 4.4 mEq/L (ref 3.5–5.1)

## 2012-09-24 LAB — LIPID PANEL: Total CHOL/HDL Ratio: 2

## 2012-09-24 LAB — TSH: TSH: 1.47 u[IU]/mL (ref 0.35–5.50)

## 2012-09-25 LAB — VITAMIN D 25 HYDROXY (VIT D DEFICIENCY, FRACTURES): Vit D, 25-Hydroxy: 15 ng/mL — ABNORMAL LOW (ref 30–89)

## 2012-10-01 ENCOUNTER — Other Ambulatory Visit (HOSPITAL_COMMUNITY)
Admission: RE | Admit: 2012-10-01 | Discharge: 2012-10-01 | Disposition: A | Discharge status: Home / Self Care | Payer: 59 | Source: Ambulatory Visit | Attending: Family Medicine | Admitting: Family Medicine

## 2012-10-01 ENCOUNTER — Encounter: Payer: Self-pay | Admitting: Family Medicine

## 2012-10-01 ENCOUNTER — Ambulatory Visit (INDEPENDENT_AMBULATORY_CARE_PROVIDER_SITE_OTHER): Payer: 59 | Admitting: Family Medicine

## 2012-10-01 VITALS — BP 142/78 | HR 82 | Temp 98.9°F | Ht 65.0 in | Wt 213.0 lb

## 2012-10-01 DIAGNOSIS — Z0000 Encounter for general adult medical examination without abnormal findings: Secondary | ICD-10-CM

## 2012-10-01 DIAGNOSIS — Z01419 Encounter for gynecological examination (general) (routine) without abnormal findings: Secondary | ICD-10-CM | POA: Insufficient documentation

## 2012-10-01 DIAGNOSIS — Z1151 Encounter for screening for human papillomavirus (HPV): Secondary | ICD-10-CM | POA: Insufficient documentation

## 2012-10-01 DIAGNOSIS — E559 Vitamin D deficiency, unspecified: Secondary | ICD-10-CM

## 2012-10-01 DIAGNOSIS — M899 Disorder of bone, unspecified: Secondary | ICD-10-CM

## 2012-10-01 NOTE — Patient Instructions (Addendum)
If you are interested in a shingles/zoster vaccine - call your insurance to check on coverage,( you should not get it within 1 month of other vaccines) , then call us for a prescription  for it to take to a pharmacy that gives the shot , or make a nurse visit to get it here depending on your coverage  You are due a colonoscopy in 6/14 Shirlee Limerick will call you about a bone density appt  Gyn exam done today Get vitamin D3 over the counter and take 4000 iu daily

## 2012-10-01 NOTE — Progress Notes (Signed)
Subjective:    Patient ID: Denise Ali, female    DOB: December 27, 1950, 62 y.o.   MRN: [id number]  HPI Here for health maintenance exam and to review chronic medical problems    Is doing ok overall   Wt is up 9 lb with bmi of 35 Is upset about her weight  Had a bad winter  Started back walking this week   Zoster - had dz Is interested in the vaccine if covered   Flu shot- did not get one this year- forgot   mammo and breast mri 7/13- has family hx Everything was ok  Self exam -no lumps or changes   Needs to get her skin exam  She is fair about sun protection  colonosc 6/09-  Had adenoma polyp with recall 5 y    Osteopenia dexa 1/10 improved D level low at 15- pt was not aware and pt is not taking any D Misses doses of evista   Pap 12/12-- was normal  Has had a hx of hpv  Needs pap today No gyn symptoms    Mood- despite stress mood is good   Lipids Lab Results  Component Value Date   CHOL 171 09/24/2012   CHOL 188 07/02/2011   CHOL 186 04/11/2010   Lab Results  Component Value Date   HDL 69.70 09/24/2012   HDL 84.50 07/02/2011   HDL 80.20 04/11/2010   Lab Results  Component Value Date   LDLCALC 88 09/24/2012   LDLCALC 93 07/02/2011   LDLCALC 95 04/11/2010   Lab Results  Component Value Date   TRIG 65.0 09/24/2012   TRIG 54.0 07/02/2011   TRIG 54.0 04/11/2010   Lab Results  Component Value Date   CHOLHDL 2 09/24/2012   CHOLHDL 2 07/02/2011   CHOLHDL 2 04/11/2010   No results found for this basename: LDLDIRECT   very good   Other labs are good   Patient Active Problem List  Diagnosis  . HUMAN PAPILLOMAVIRUS  . UNSPECIFIED VITAMIN D DEFICIENCY  . HYPOKALEMIA  . STRESS REACTION, ACUTE, WITH EMOTIONAL DISTURBANCE  . OSTEOPENIA  . EDEMA  . Irregular heart beat  . Routine general medical examination at a health care facility  . Encounter for routine gynecological examination  . Breast cancer screening, high risk patient   Past Medical History   Diagnosis Date  . Edema   . 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 Family history of malignant neoplasm of breast     evista for BRCA ppx  . Hypopotassemia   . Neoplasm of uncertain behavior of skin   . Disorder of bone and cartilage, unspecified   . Other screening mammogram   . Routine gynecological examination   . Predominant disturbance of emotions   . Unspecified vitamin D deficiency   . Colon polyp 12/2007  . Fibrocystic breast   . Bradycardia 9/12    mild - at one time with PACs/ asymptomatic   . Breast cancer screening, high risk patient 10/18/2011   Past Surgical History  Procedure Laterality Date  . Tonsillectomy    . Leep      HPV  . Dexa  12/2003    osteopenia  . Dexa  11/07    stable  . Dexa  1/10    Osteopenia-slightly improved  . Breast biopsy  01/2010  . Breast mri  01/2010    Normal  . Breast biopsy  8/11    Fibrocystic change  .  Cataracts  9/12    bilateral   History  Substance Use Topics  . Smoking status: Former Smoker    Quit date: 07/23/1985  . Smokeless tobacco: Not on file     Comment: Quit "years ago"  . Alcohol Use: Yes     Comment: Occasional   Family History  Problem Relation Age of Onset  . Diabetes Father   . Skin cancer Father     ?  Marland Kitchen Breast cancer Mother   . Osteoporosis Mother   . Osteopenia Sister   . Breast cancer Sister   . Breast cancer      Grandmother  . Breast cancer      cousin  . Heart attack Paternal Grandfather    Allergies  Allergen Reactions  . Alendronate Sodium     REACTION: leg and joint pain   Current Outpatient Prescriptions on File Prior to Visit  Medication Sig Dispense Refill  . acetaminophen (TYLENOL) 650 MG CR tablet Take 650 mg by mouth as needed.      . Calcium 1200-1000 MG-UNIT CHEW Chew 1 tablet by mouth daily.        . calcium carbonate (TUMS - DOSED IN MG ELEMENTAL CALCIUM) 500 MG chewable tablet Chew 2 tablets by mouth as needed.       .  naproxen sodium (ANAPROX) 220 MG tablet Take 220 mg by mouth daily.        . raloxifene (EVISTA) 60 MG tablet Take 60 mg by mouth daily.         No current facility-administered medications on file prior to visit.    Review of Systems Review of Systems  Constitutional: Negative for fever, appetite change, fatigue and unexpected weight change.  Eyes: Negative for pain and visual disturbance.  Respiratory: Negative for cough and shortness of breath.   Cardiovascular: Negative for cp or palpitations    Gastrointestinal: Negative for nausea, diarrhea and constipation.  Genitourinary: Negative for urgency and frequency.  Skin: Negative for pallor or rash   Neurological: Negative for weakness, light-headedness, numbness and headaches.  Hematological: Negative for adenopathy. Does not bruise/bleed easily.  Psychiatric/Behavioral: Negative for dysphoric mood. The patient is not nervous/anxious.         Objective:   Physical Exam  Constitutional: She appears well-developed and well-nourished. No distress.  HENT:  Head: Normocephalic and atraumatic.  Right Ear: External ear normal.  Left Ear: External ear normal.  Nose: Nose normal.  Mouth/Throat: Oropharynx is clear and moist.  Eyes: Conjunctivae and EOM are normal. Pupils are equal, round, and reactive to light. Right eye exhibits no discharge. Left eye exhibits no discharge. No scleral icterus.  Neck: Normal range of motion. Neck supple. No JVD present. Carotid bruit is not present. No thyromegaly present.  Cardiovascular: Normal rate, regular rhythm, normal heart sounds and intact distal pulses.  Exam reveals no gallop.   Pulmonary/Chest: Effort normal and breath sounds normal. No respiratory distress. She has no wheezes.  Abdominal: Soft. Bowel sounds are normal. She exhibits no distension, no abdominal bruit and no mass. There is no tenderness.  Genitourinary: No breast swelling, tenderness, discharge or bleeding. There is no rash,  tenderness or lesion on the right labia. There is no rash, tenderness or lesion on the left labia. Uterus is not enlarged and not tender. Cervix exhibits no motion tenderness, no discharge and no friability. Right adnexum displays no mass, no tenderness and no fullness. Left adnexum displays no mass, no tenderness and no fullness. No bleeding  around the vagina. No vaginal discharge found.  Breast exam: No mass, nodules, thickening, tenderness, bulging, retraction, inflamation, nipple discharge or skin changes noted.  No axillary or clavicular LA.  Chaperoned exam.    Musculoskeletal: She exhibits no edema and no tenderness.  Lymphadenopathy:    She has no cervical adenopathy.  Neurological: She is alert. She has normal reflexes. No cranial nerve deficit. She exhibits normal muscle tone. Coordination normal.  Skin: Skin is warm and dry. No rash noted. No erythema. No pallor.  Psychiatric: She has a normal mood and affect.          Assessment & Plan:

## 2012-10-01 NOTE — Assessment & Plan Note (Signed)
Disc imp of D to bone and overall health Level today 15- very low inst to start 4000 iu daily and get outdoors as well  dexa also ordered

## 2012-10-01 NOTE — Assessment & Plan Note (Signed)
dexa planned Disc need for vit D -will start 4000 iu daily On evista - primarily for breast ca prev from oncology No falls or fractures

## 2012-10-01 NOTE — Assessment & Plan Note (Signed)
Reviewed health habits including diet and exercise and skin cancer prevention Also reviewed health mt list, fam hx and immunizations  Rev wellnesss labs Pt plans to work hard on diet and exercise and wt loss  utd cancer screen Will have colonoscopy this summer Sees oncol for b cancer screening  Will check on coverage of zoster vaccine

## 2012-10-01 NOTE — Assessment & Plan Note (Signed)
Pap and exam done No complaints Remote hx of hpv

## 2012-10-07 ENCOUNTER — Encounter: Payer: Self-pay | Admitting: *Deleted

## 2012-10-17 ENCOUNTER — Telehealth: Payer: Self-pay | Admitting: Oncology

## 2012-10-17 ENCOUNTER — Encounter: Payer: Self-pay | Admitting: Oncology

## 2012-10-17 ENCOUNTER — Ambulatory Visit (HOSPITAL_BASED_OUTPATIENT_CLINIC_OR_DEPARTMENT_OTHER): Payer: 59 | Admitting: Oncology

## 2012-10-17 ENCOUNTER — Other Ambulatory Visit (HOSPITAL_BASED_OUTPATIENT_CLINIC_OR_DEPARTMENT_OTHER): Payer: 59 | Admitting: Lab

## 2012-10-17 VITALS — BP 130/76 | HR 70 | Temp 98.5°F | Resp 20 | Ht 65.0 in | Wt 211.1 lb

## 2012-10-17 DIAGNOSIS — Z803 Family history of malignant neoplasm of breast: Secondary | ICD-10-CM

## 2012-10-17 DIAGNOSIS — M25559 Pain in unspecified hip: Secondary | ICD-10-CM

## 2012-10-17 DIAGNOSIS — Z1239 Encounter for other screening for malignant neoplasm of breast: Secondary | ICD-10-CM

## 2012-10-17 LAB — COMPREHENSIVE METABOLIC PANEL (CC13)
Albumin: 3.3 g/dL — ABNORMAL LOW (ref 3.5–5.0)
Alkaline Phosphatase: 66 U/L (ref 40–150)
CO2: 27 mEq/L (ref 22–29)
Glucose: 99 mg/dl (ref 70–99)
Potassium: 4 mEq/L (ref 3.5–5.1)
Sodium: 141 mEq/L (ref 136–145)
Total Protein: 6.6 g/dL (ref 6.4–8.3)

## 2012-10-17 LAB — CBC WITH DIFFERENTIAL/PLATELET
Eosinophils Absolute: 0.1 10*3/uL (ref 0.0–0.5)
MONO#: 0.5 10*3/uL (ref 0.1–0.9)
NEUT#: 3.2 10*3/uL (ref 1.5–6.5)
RBC: 4.08 10*6/uL (ref 3.70–5.45)
RDW: 13.1 % (ref 11.2–14.5)
WBC: 5.7 10*3/uL (ref 3.9–10.3)

## 2012-10-17 NOTE — Telephone Encounter (Signed)
gv and printed appt schedule for pt for July and March 2015.Marland KitchenMarland Kitchenpt aware cs will contact for MR

## 2012-10-17 NOTE — Patient Instructions (Addendum)
Continue evista daily as recommended  MRI of breasts in July  I will see you back in 1 year

## 2012-11-11 NOTE — Progress Notes (Signed)
OFFICE PROGRESS NOTE  CC  [name], MD [address] [place] [address] [place] [address]  DIAGNOSIS: 62 year old female with family history of breast cancer on Evista for breast cancer risk reduction.  PRIOR THERAPY:  #1 patient has been having breast MRIs performed on a yearly basis.   #2 Evista 60 mg daily.But patient has not taken Evista for quite some time.   #3 left hip pain  Patient has had cortisone injections done to  CURRENT THERAPY:Evista 60 mg daily  INTERVAL HISTORY: Denise Ali 62 y.o. female returns for Followup visit. Overall she's doing well. She has been having some hip pain in the left. She has received cortisone injections for this with some relief. She has not been on Evista. She does state that it caused hot flashes off-and-on but not too much. She otherwise denies any fevers chills night sweats headaches she has no shortness of breath no chest pains. Patient also understands that not taking Evista she does have a risk of developing breast cancer. Her lifetime risk as, calculated by tyrer cusick model was 29.49% and this is the reason why we recommended  starting on Evista. She also has a very strong family history.  MEDICAL HISTORY: Past Medical History  Diagnosis Date  . Edema   . 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 Family history of malignant neoplasm of breast     evista for BRCA ppx  . Hypopotassemia   . Neoplasm of uncertain behavior of skin   . Disorder of bone and cartilage, unspecified   . Other screening mammogram   . Routine gynecological examination   . Predominant disturbance of emotions   . Unspecified vitamin D deficiency   . Colon polyp 12/2007  . Fibrocystic breast   . Bradycardia 9/12    mild - at one time with PACs/ asymptomatic   . Breast cancer screening, high risk patient 10/18/2011    ALLERGIES:  is allergic to alendronate sodium.  MEDICATIONS:   Current Outpatient Prescriptions  Medication Sig Dispense Refill  . acetaminophen (TYLENOL) 650 MG CR tablet Take 650 mg by mouth as needed.      . cholecalciferol (VITAMIN D) 1000 UNITS tablet Take 2,000 Units by mouth 2 (two) times daily.      . raloxifene (EVISTA) 60 MG tablet Take 60 mg by mouth daily.        . Calcium 1200-1000 MG-UNIT CHEW Chew 1 tablet by mouth daily.        . calcium carbonate (TUMS - DOSED IN MG ELEMENTAL CALCIUM) 500 MG chewable tablet Chew 2 tablets by mouth as needed.       . naproxen sodium (ANAPROX) 220 MG tablet Take 220 mg by mouth daily.        No current facility-administered medications for this visit.    SURGICAL HISTORY:  Past Surgical History  Procedure Laterality Date  . Tonsillectomy    . Leep      HPV  . Dexa  12/2003    osteopenia  . Dexa  11/07    stable  . Dexa  1/10    Osteopenia-slightly improved  . Breast biopsy  01/2010  . Breast mri  01/2010    Normal  . Breast biopsy  8/11    Fibrocystic change  . Cataracts  9/12    bilateral    REVIEW OF SYSTEMS:  Pertinent items are noted in HPI.   PHYSICAL EXAMINATION: General  appearance: alert, cooperative and appears stated age Lymph nodes: Cervical, supraclavicular, and axillary nodes normal. Resp: clear to auscultation bilaterally and normal percussion bilaterally Back: symmetric, no curvature. ROM normal. No CVA tenderness. Cardio: regular rate and rhythm, S1, S2 normal, no murmur, click, rub or gallop GI: soft, non-tender; bowel sounds normal; no masses,  no organomegaly Extremities: extremities normal, atraumatic, no cyanosis or edema Neurologic: Grossly normal  ECOG PERFORMANCE STATUS: 0 - Asymptomatic  Blood pressure 130/76, pulse 70, temperature 98.5 F (36.9 C), temperature source Oral, resp. rate 20, height 5\' 5"  (1.651 m), weight 211 lb 1.6 oz (95.754 kg).  LABORATORY DATA: Lab Results  Component Value Date   WBC 5.7 10/17/2012   HGB 12.2 10/17/2012   HCT 36.9  10/17/2012   MCV 90.4 10/17/2012   PLT 215 10/17/2012      Chemistry      Component Value Date/Time   NA 141 10/17/2012 1050   NA 139 09/24/2012 0844   NA 140 04/13/2010 1404   K 4.0 10/17/2012 1050   K 4.4 09/24/2012 0844   K 4.6 04/13/2010 1404   CL 107 10/17/2012 1050   CL 103 09/24/2012 0844   CL 99 04/13/2010 1404   CO2 27 10/17/2012 1050   CO2 27 09/24/2012 0844   CO2 29 04/13/2010 1404   BUN 16.0 10/17/2012 1050   BUN 17 09/24/2012 0844   BUN 15 04/13/2010 1404   CREATININE 0.7 10/17/2012 1050   CREATININE 0.7 09/24/2012 0844   CREATININE 0.6 04/13/2010 1404      Component Value Date/Time   CALCIUM 9.4 10/17/2012 1050   CALCIUM 9.3 09/24/2012 0844   CALCIUM 9.3 04/13/2010 1404   ALKPHOS 66 10/17/2012 1050   ALKPHOS 63 09/24/2012 0844   ALKPHOS 59 04/13/2010 1404   AST 16 10/17/2012 1050   AST 17 09/24/2012 0844   AST 19 04/13/2010 1404   ALT 12 10/17/2012 1050   ALT 12 09/24/2012 0844   BILITOT 0.53 10/17/2012 1050   BILITOT 0.6 09/24/2012 0844   BILITOT 0.80 04/13/2010 1404       RADIOGRAPHIC STUDIES:  No results found.  ASSESSMENT: 62 year old female with:  1.  lifetime risk of developing breast cancer 29% patient has been on breast cancer risk reduction therapy consisting of Evista 60 mg daily.   2. Arthritis  #3 she does need a followup MRI of the breasts in July.   PLAN:   #1 I have recommended that patient restart Evista and she will consider this.  #2 she does need to be scheduled for breast MRI  so we'll get this scheduled.  #3 she will be seen back in one years time or sooner if need arises.   All questions were answered. The patient knows to call the clinic with any problems, questions or concerns. We can certainly see the patient much sooner if necessary.  I spent 25 minutes counseling the patient face to face. The total time spent in the appointment was 30 minutes.    Drue Second, MD Medical/Oncology [facility] [phone] (beeper) [phone]  (Office)  11/11/2012, 8:33 PM

## 2012-12-31 ENCOUNTER — Encounter: Payer: Self-pay | Admitting: Gastroenterology

## 2013-01-06 ENCOUNTER — Encounter: Payer: Self-pay | Admitting: Gastroenterology

## 2013-01-06 IMAGING — CR DG HIP (WITH OR WITHOUT PELVIS) 2-3V*L*
3 series · 3 of 3 positions shown · non-contrast
Comparison: None.

CLINICAL DATA: Left hip pain for 6 months, no trauma

LEFT HIP - COMPLETE 2+ VIEW

[view not recorded (1 of 3)]
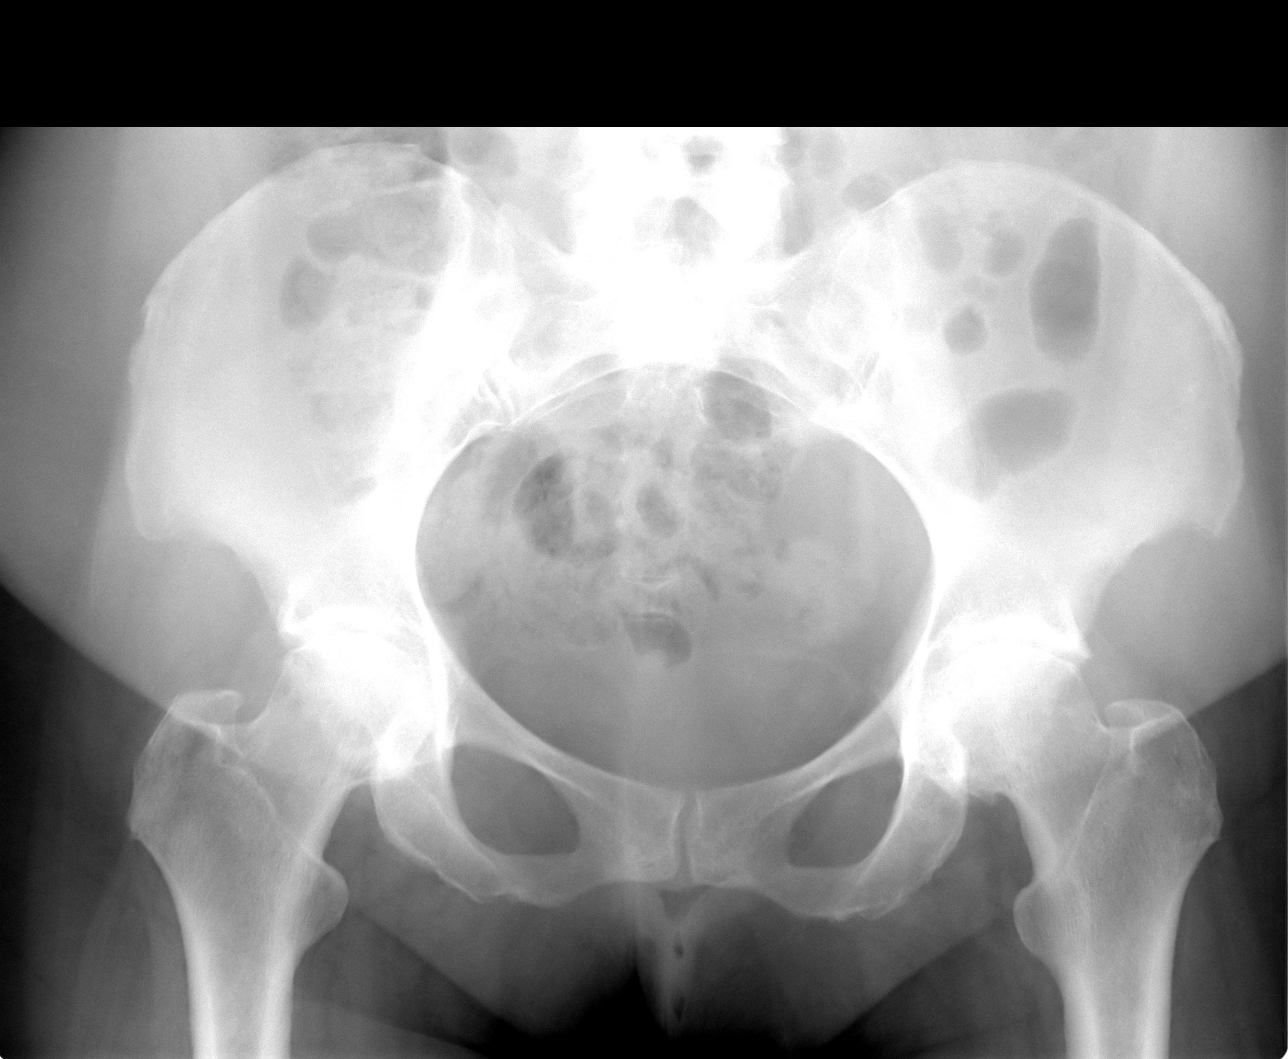

[view not recorded (2 of 3)]
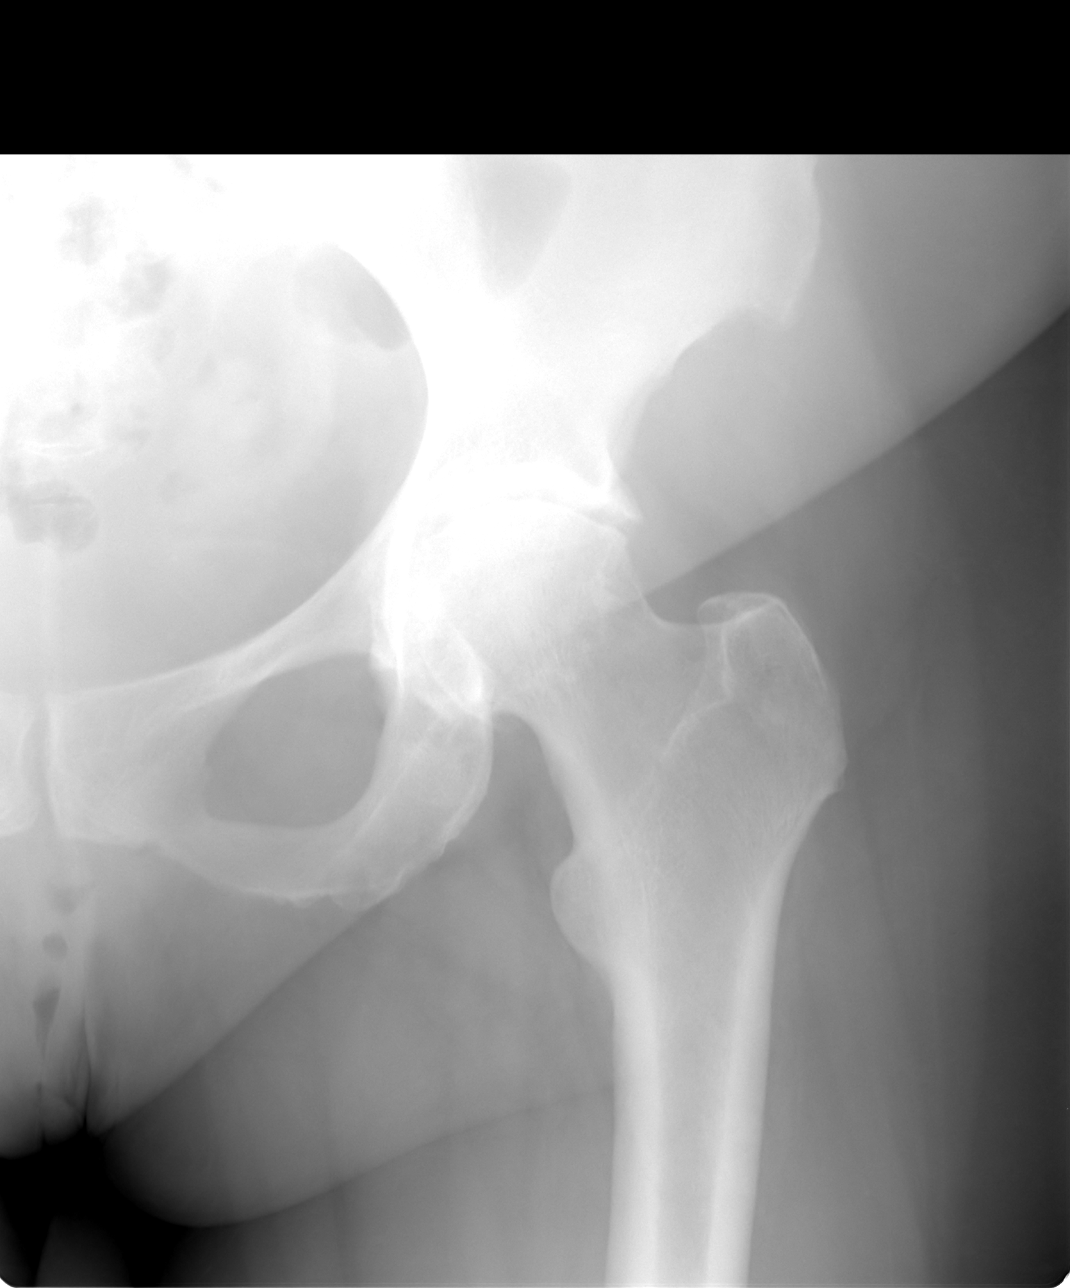

[view not recorded (3 of 3)]
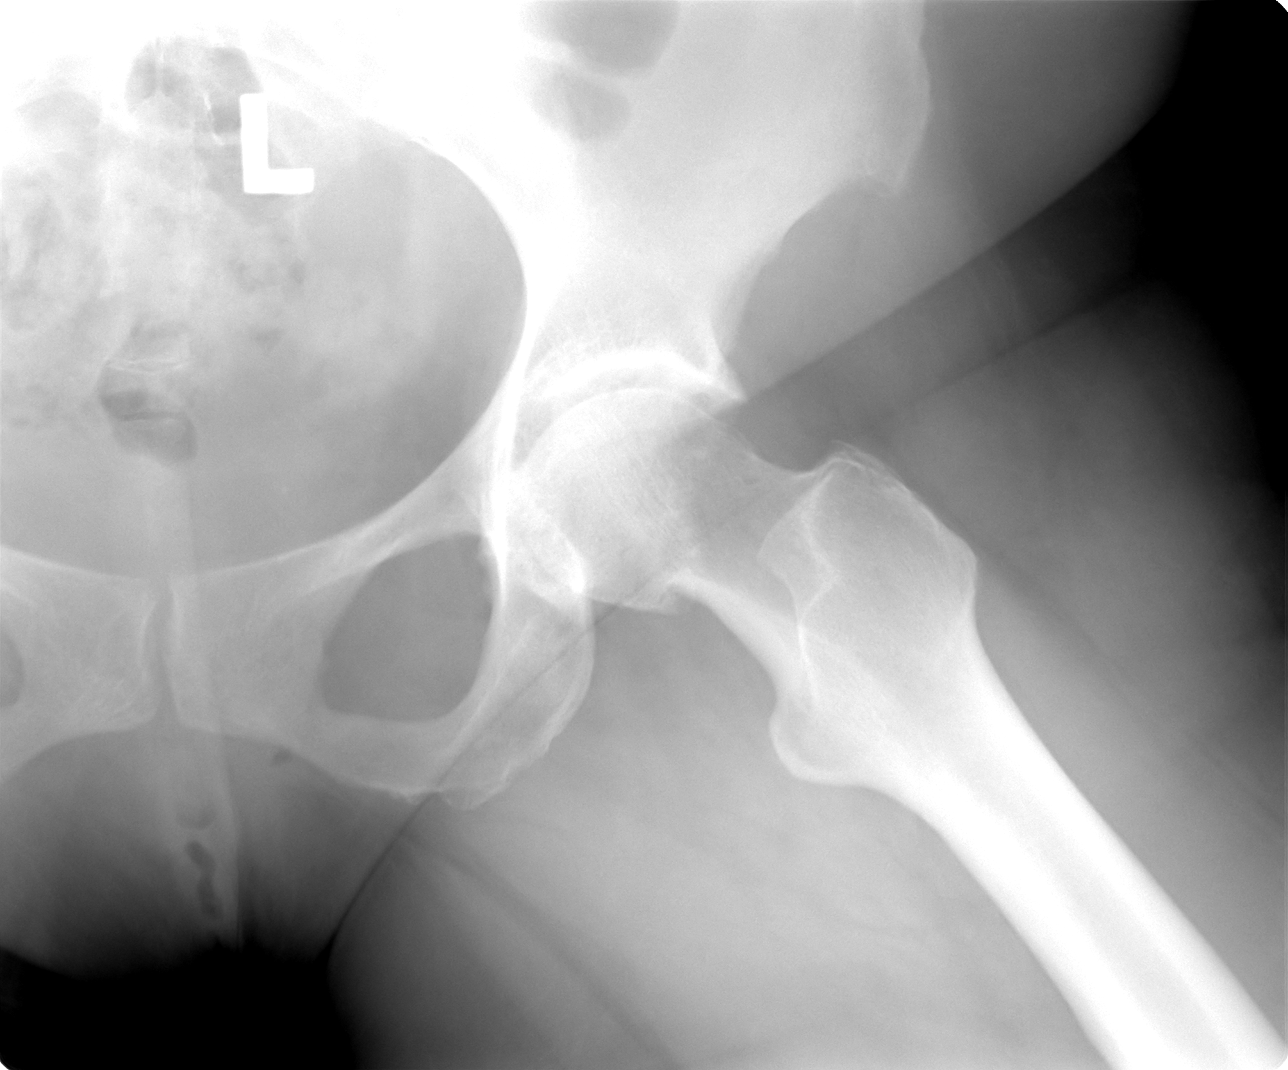

[3 of 3 positions shown; findings below may reference images not displayed]

FINDINGS: There is moderate degenerative joint disease primarily
involving the left hip where there is more loss of joint space and
sclerosis with spurring.  No acute fracture is seen.  Lesser
degenerative joint disease is noted involving the right hip.  The
pelvic rami are intact.  The SI joints appear normal.
IMPRESSION: Moderate degenerative joint disease of the left hip.  No acute
abnormality.

## 2013-01-28 ENCOUNTER — Telehealth: Payer: Self-pay | Admitting: Oncology

## 2013-01-28 NOTE — Telephone Encounter (Signed)
, °

## 2013-02-02 ENCOUNTER — Other Ambulatory Visit: Payer: 59 | Admitting: Lab

## 2013-02-12 ENCOUNTER — Other Ambulatory Visit: Payer: Self-pay | Admitting: Family Medicine

## 2013-02-12 ENCOUNTER — Encounter: Payer: Self-pay | Admitting: Family Medicine

## 2013-02-12 NOTE — Telephone Encounter (Signed)
Please verify that with patient - and if she wants me to px it , ask how many years she has been on it so far  Thanks

## 2013-02-12 NOTE — Telephone Encounter (Signed)
Left voicemail requesting pt to call office 

## 2013-02-12 NOTE — Telephone Encounter (Signed)
Pt said she does need a refill on Rx since Dr. Welton Flakes isn't prescribing it anymore, pt said she wasn't taking it on a regular basis like she should but want's to get back on track and start taking it daily, pt said she has been on Rx for about 2.5 years but wasn't taking it daily

## 2013-02-12 NOTE — Telephone Encounter (Signed)
Ok- refill for a year please

## 2013-02-12 NOTE — Telephone Encounter (Signed)
Electronic refill request, med on pt's med list but doesn't look like you have prescribed it in the past, last time it was Dr. Welton Flakes, please advise

## 2013-02-13 ENCOUNTER — Encounter: Payer: Self-pay | Admitting: *Deleted

## 2013-02-13 NOTE — Telephone Encounter (Signed)
done

## 2013-02-16 ENCOUNTER — Other Ambulatory Visit (HOSPITAL_BASED_OUTPATIENT_CLINIC_OR_DEPARTMENT_OTHER): Payer: 59 | Admitting: Lab

## 2013-02-16 DIAGNOSIS — Z1239 Encounter for other screening for malignant neoplasm of breast: Secondary | ICD-10-CM

## 2013-02-16 DIAGNOSIS — Z1231 Encounter for screening mammogram for malignant neoplasm of breast: Secondary | ICD-10-CM

## 2013-02-16 LAB — COMPREHENSIVE METABOLIC PANEL (CC13)
ALT: 7 U/L (ref 0–55)
AST: 15 U/L (ref 5–34)
Albumin: 3.5 g/dL (ref 3.5–5.0)
Alkaline Phosphatase: 65 U/L (ref 40–150)
Glucose: 111 mg/dl (ref 70–140)
Potassium: 4 mEq/L (ref 3.5–5.1)
Sodium: 140 mEq/L (ref 136–145)
Total Protein: 6.7 g/dL (ref 6.4–8.3)

## 2013-02-16 LAB — CBC WITH DIFFERENTIAL/PLATELET
BASO%: 0.4 % (ref 0.0–2.0)
Eosinophils Absolute: 0.1 10*3/uL (ref 0.0–0.5)
MCHC: 33.2 g/dL (ref 31.5–36.0)
MCV: 90.7 fL (ref 79.5–101.0)
MONO%: 9.3 % (ref 0.0–14.0)
NEUT#: 3 10*3/uL (ref 1.5–6.5)
RBC: 4.08 10*6/uL (ref 3.70–5.45)
RDW: 13.4 % (ref 11.2–14.5)
WBC: 5.4 10*3/uL (ref 3.9–10.3)

## 2013-03-06 ENCOUNTER — Encounter: Payer: Self-pay | Admitting: Gastroenterology

## 2013-03-06 ENCOUNTER — Ambulatory Visit (AMBULATORY_SURGERY_CENTER): Payer: 59

## 2013-03-06 VITALS — Ht 65.0 in | Wt 209.6 lb

## 2013-03-06 DIAGNOSIS — Z860100 Personal history of colon polyps, unspecified: Secondary | ICD-10-CM

## 2013-03-06 MED ORDER — MOVIPREP 100 G PO SOLR: 1.0000 | Freq: Once | ORAL | Status: DC

## 2013-03-20 ENCOUNTER — Ambulatory Visit (AMBULATORY_SURGERY_CENTER): Payer: 59 | Admitting: Gastroenterology

## 2013-03-20 ENCOUNTER — Encounter: Payer: Self-pay | Admitting: Gastroenterology

## 2013-03-20 VITALS — BP 147/74 | HR 62 | Temp 97.4°F | Resp 24 | Ht 65.0 in | Wt 209.0 lb

## 2013-03-20 DIAGNOSIS — D126 Benign neoplasm of colon, unspecified: Secondary | ICD-10-CM

## 2013-03-20 DIAGNOSIS — Z86010 Personal history of colonic polyps: Secondary | ICD-10-CM

## 2013-03-20 MED ORDER — SODIUM CHLORIDE 0.9 % IV SOLN: 500.0000 mL | INTRAVENOUS | Status: DC

## 2013-03-20 NOTE — Progress Notes (Signed)
Patient did not have preoperative order for IV antibiotic SSI prophylaxis. (G8918)  Patient did not experience any of the following events: a burn prior to discharge; a fall within the facility; wrong site/side/patient/procedure/implant event; or a hospital transfer or hospital admission upon discharge from the facility. (G8907)  

## 2013-03-20 NOTE — Op Note (Signed)
[facility] [address] [place], [address]   COLONOSCOPY PROCEDURE REPORT  PATIENT: Denise Ali, Denise Ali  MR#: [id number] BIRTHDATE: 02-16-1951 , 61  yrs. old GENDER: Female ENDOSCOPIST: Rachael Fee, MD PROCEDURE DATE:  03/20/2013 PROCEDURE:   Colonoscopy with biopsy and Colonoscopy with snare polypectomy First Screening Colonoscopy - Avg.  risk and is 50 yrs.  old or older - No.  Prior Negative Screening - Now for repeat screening. N/A  History of Adenoma - Now for follow-up colonoscopy & has been > or = to 3 yrs.  Yes hx of adenoma.  Has been 3 or more years since last colonoscopy.  Polyps Removed Today? Yes. ASA CLASS:   Class II INDICATIONS:63mm adenoma removed 2009. MEDICATIONS: Fentanyl 50 mcg IV, Versed 5 mg IV, and These medications were titrated to patient response per physician's verbal order  DESCRIPTION OF PROCEDURE:   After the risks benefits and alternatives of the procedure were thoroughly explained, informed consent was obtained.  A digital rectal exam revealed no abnormalities of the rectum.   The [id number]  endoscope was introduced through the anus and advanced to the cecum, which was identified by both the appendix and ileocecal valve. No adverse events experienced.   The quality of the prep was good, using MoviPrep  The instrument was then slowly withdrawn as the colon was fully examined.  COLON FINDINGS: Two polyps were found, removed and sent to pathology.  One was 2mm, sessile, transverse segment, removed with biopsy, sent to pathology.  One was 4mm, sessile, sigmoid segment, removed with cold snare, sent to path.  The examination was otherwise normal.  Retroflexed views revealed no abnormalities. The time to cecum=3 minutes 07 seconds.  Withdrawal time=9 minutes 38 seconds.  The scope was withdrawn and the procedure completed. COMPLICATIONS: There were no complications.  ENDOSCOPIC IMPRESSION: Two polyps were found,  removed and sent to pathology. The examination was otherwise normal.  RECOMMENDATIONS: If the polyp(s) removed today are proven to be adenomatous (pre-cancerous) polyps, you will need a repeat colonoscopy in 5 years.  Otherwise you should continue to follow colorectal cancer screening guidelines for "routine risk" patients with colonoscopy in 10 years.  You will receive a letter within 1-2 weeks with the results of your biopsy as well as final recommendations.  Please call my office if you have not received a letter after 3 weeks.  eSigned:  Rachael Fee, MD 03/20/2013 9:21 AM

## 2013-03-20 NOTE — Patient Instructions (Addendum)

## 2013-03-20 NOTE — Progress Notes (Signed)
No egg or soy allergy. ewm No problems with past sedation. ewm 

## 2013-03-20 NOTE — Progress Notes (Signed)
The pt tolerated the colonoscopy very well. Maw   

## 2013-03-24 ENCOUNTER — Telehealth: Payer: Self-pay | Admitting: *Deleted

## 2013-03-24 NOTE — Telephone Encounter (Signed)
  Follow up Call-  Call back number 03/20/2013  Post procedure Call Back phone  # [phone]  Permission to leave phone message Yes     Patient questions:  Do you have a fever, pain , or abdominal swelling? no Pain Score  0 *  Have you tolerated food without any problems? yes  Have you been able to return to your normal activities? yes  Do you have any questions about your discharge instructions: Diet   no Medications  no Follow up visit  no  Do you have questions or concerns about your Care? no  Actions: * If pain score is 4 or above: No action needed, pain <4.

## 2013-03-27 ENCOUNTER — Encounter: Payer: Self-pay | Admitting: Gastroenterology

## 2013-03-31 ENCOUNTER — Ambulatory Visit (INDEPENDENT_AMBULATORY_CARE_PROVIDER_SITE_OTHER): Payer: 59 | Admitting: *Deleted

## 2013-03-31 ENCOUNTER — Telehealth: Payer: Self-pay | Admitting: *Deleted

## 2013-03-31 DIAGNOSIS — Z23 Encounter for immunization: Secondary | ICD-10-CM

## 2013-03-31 NOTE — Telephone Encounter (Signed)
Pt left voicemail: her grandchild will be born Friday and pt was requesting a nurse visit for a tdap imm.  Called pt but no answer so left voicemail requesting pt to call office to schedule vaccine

## 2013-03-31 NOTE — Telephone Encounter (Signed)
Scheduled Tdapp inj for 03/31/2013 @ 4:00 p.m.

## 2013-06-17 ENCOUNTER — Telehealth: Payer: Self-pay | Admitting: *Deleted

## 2013-06-17 NOTE — Telephone Encounter (Signed)
sw pt infomed her that KK will be in [facility] on 10/14/13. gv appt for 10/14/13@ 1pm..the patient is aware...td

## 2013-09-24 ENCOUNTER — Telehealth: Payer: Self-pay | Admitting: Family Medicine

## 2013-09-24 DIAGNOSIS — M949 Disorder of cartilage, unspecified: Principal | ICD-10-CM

## 2013-09-24 DIAGNOSIS — Z0000 Encounter for general adult medical examination without abnormal findings: Secondary | ICD-10-CM

## 2013-09-24 DIAGNOSIS — E559 Vitamin D deficiency, unspecified: Secondary | ICD-10-CM

## 2013-09-24 DIAGNOSIS — M899 Disorder of bone, unspecified: Secondary | ICD-10-CM

## 2013-09-24 NOTE — Telephone Encounter (Signed)
Message copied by Abner Greenspan on Thu Sep 24, 2013  4:30 PM ------      Message from: Ellamae Sia      Created: Fri Sep 18, 2013  3:33 PM      Regarding: Lab orders for Friday, 3.6.15       Patient is scheduled for CPX labs, please order future labs, Thanks , Terri       ------

## 2013-09-25 ENCOUNTER — Other Ambulatory Visit (INDEPENDENT_AMBULATORY_CARE_PROVIDER_SITE_OTHER): Payer: 59

## 2013-09-25 DIAGNOSIS — M949 Disorder of cartilage, unspecified: Secondary | ICD-10-CM

## 2013-09-25 DIAGNOSIS — M899 Disorder of bone, unspecified: Secondary | ICD-10-CM

## 2013-09-25 DIAGNOSIS — E559 Vitamin D deficiency, unspecified: Secondary | ICD-10-CM

## 2013-09-25 DIAGNOSIS — Z0000 Encounter for general adult medical examination without abnormal findings: Secondary | ICD-10-CM

## 2013-09-25 LAB — LIPID PANEL
CHOL/HDL RATIO: 2
Cholesterol: 167 mg/dL (ref 0–200)
HDL: 67.2 mg/dL (ref >39.00)
LDL Cholesterol: 88 mg/dL (ref 0–99)
Triglycerides: 58 mg/dL (ref 0.0–149.0)
VLDL: 11.6 mg/dL (ref 0.0–40.0)

## 2013-09-25 LAB — CBC WITH DIFFERENTIAL/PLATELET
BASOS ABS: 0 10*3/uL (ref 0.0–0.1)
Basophils Relative: 0.5 % (ref 0.0–3.0)
EOS ABS: 0.1 10*3/uL (ref 0.0–0.7)
Eosinophils Relative: 2.1 % (ref 0.0–5.0)
HCT: 37.5 % (ref 36.0–46.0)
Hemoglobin: 12.6 g/dL (ref 12.0–15.0)
LYMPHS ABS: 1.5 10*3/uL (ref 0.7–4.0)
Lymphocytes Relative: 27.7 % (ref 12.0–46.0)
MCHC: 33.7 g/dL (ref 30.0–36.0)
MCV: 90.9 fl (ref 78.0–100.0)
Monocytes Absolute: 0.5 10*3/uL (ref 0.1–1.0)
Monocytes Relative: 9.7 % (ref 3.0–12.0)
NEUTROS ABS: 3.3 10*3/uL (ref 1.4–7.7)
Neutrophils Relative %: 60 % (ref 43.0–77.0)
PLATELETS: 220 10*3/uL (ref 150.0–400.0)
RBC: 4.13 Mil/uL (ref 3.87–5.11)
RDW: 13.9 % (ref 11.5–14.6)
WBC: 5.6 10*3/uL (ref 4.5–10.5)

## 2013-09-25 LAB — COMPREHENSIVE METABOLIC PANEL
ALT: 18 U/L (ref 0–35)
AST: 19 U/L (ref 0–37)
Albumin: 3.8 g/dL (ref 3.5–5.2)
Alkaline Phosphatase: 58 U/L (ref 39–117)
BILIRUBIN TOTAL: 0.7 mg/dL (ref 0.3–1.2)
BUN: 16 mg/dL (ref 6–23)
CO2: 28 mEq/L (ref 19–32)
Calcium: 9.3 mg/dL (ref 8.4–10.5)
Chloride: 107 mEq/L (ref 96–112)
Creatinine, Ser: 0.7 mg/dL (ref 0.4–1.2)
GFR: 88.6 mL/min (ref >60.00)
Glucose, Bld: 100 mg/dL — ABNORMAL HIGH (ref 70–99)
Potassium: 4.6 mEq/L (ref 3.5–5.1)
SODIUM: 140 meq/L (ref 135–145)
Total Protein: 6.8 g/dL (ref 6.0–8.3)

## 2013-09-25 LAB — TSH: TSH: 1.37 u[IU]/mL (ref 0.35–5.50)

## 2013-09-26 LAB — VITAMIN D 25 HYDROXY (VIT D DEFICIENCY, FRACTURES): Vit D, 25-Hydroxy: 18 ng/mL — ABNORMAL LOW (ref 30–89)

## 2013-10-02 ENCOUNTER — Ambulatory Visit (INDEPENDENT_AMBULATORY_CARE_PROVIDER_SITE_OTHER): Payer: 59 | Admitting: Family Medicine

## 2013-10-02 ENCOUNTER — Encounter: Payer: Self-pay | Admitting: Family Medicine

## 2013-10-02 VITALS — BP 130/72 | HR 68 | Temp 98.0°F | Ht 64.5 in | Wt 211.2 lb

## 2013-10-02 DIAGNOSIS — E559 Vitamin D deficiency, unspecified: Secondary | ICD-10-CM

## 2013-10-02 DIAGNOSIS — Z0000 Encounter for general adult medical examination without abnormal findings: Secondary | ICD-10-CM

## 2013-10-02 DIAGNOSIS — M899 Disorder of bone, unspecified: Secondary | ICD-10-CM

## 2013-10-02 DIAGNOSIS — M949 Disorder of cartilage, unspecified: Secondary | ICD-10-CM

## 2013-10-02 MED ORDER — ERGOCALCIFEROL 1.25 MG (50000 UT) PO CAPS: 50000.0000 [IU] | ORAL_CAPSULE | ORAL | Status: DC

## 2013-10-02 NOTE — Progress Notes (Signed)
Subjective:    Patient ID: Denise Ali, female    DOB: 1950-11-13, 63 y.o.   MRN: [id number]  HPI Here for health maintenance exam and to review chronic medical problems    Has been doing ok overall  Nothing new going on health wise   Wt is up 2 lb with bmi of 35 Recently started walking - feels good about that  Diet is not optimal - does not eat healthy at all - with a busy schedule    Zoster status -had shingles but not the vaccine  Flu vaccine - she had one this fall at work  Mammogram 7/14 ok  No lumps on self exam , she does get soreness occ in lateral breasts  Has strong fam hx of bca -she is on evista and seen Dr Orion Crook closely  Pap 3/14 nl  Remote hx of HPV No problems gyn at all  Has had more than 3 nl pap smears in a row No new partners   colonosc 8/14  Td 9/14  dexa 1/10 She forgot her last one  Osteopenia Intol of fosamax  D level low at 18- up from 15- she has not been totally compliant with her ca and D  On evista  She drinks too much carbonated bev - diet coke  She also drinks water  No recent fractures   Results for orders placed in visit on 09/25/13  CBC WITH DIFFERENTIAL      Result Value Ref Range   WBC 5.6  4.5 - 10.5 K/uL   RBC 4.13  3.87 - 5.11 Mil/uL   Hemoglobin 12.6  12.0 - 15.0 g/dL   HCT 37.5  36.0 - 46.0 %   MCV 90.9  78.0 - 100.0 fl   MCHC 33.7  30.0 - 36.0 g/dL   RDW 13.9  11.5 - 14.6 %   Platelets 220.0  150.0 - 400.0 K/uL   Neutrophils Relative % 60.0  43.0 - 77.0 %   Lymphocytes Relative 27.7  12.0 - 46.0 %   Monocytes Relative 9.7  3.0 - 12.0 %   Eosinophils Relative 2.1  0.0 - 5.0 %   Basophils Relative 0.5  0.0 - 3.0 %   Neutro Abs 3.3  1.4 - 7.7 K/uL   Lymphs Abs 1.5  0.7 - 4.0 K/uL   Monocytes Absolute 0.5  0.1 - 1.0 K/uL   Eosinophils Absolute 0.1  0.0 - 0.7 K/uL   Basophils Absolute 0.0  0.0 - 0.1 K/uL  COMPREHENSIVE METABOLIC PANEL      Result Value Ref Range   Sodium 140  135 - 145 mEq/L   Potassium 4.6  3.5 - 5.1 mEq/L   Chloride 107  96 - 112 mEq/L   CO2 28  19 - 32 mEq/L   Glucose, Bld 100 (*) 70 - 99 mg/dL   BUN 16  6 - 23 mg/dL   Creatinine, Ser 0.7  0.4 - 1.2 mg/dL   Total Bilirubin 0.7  0.3 - 1.2 mg/dL   Alkaline Phosphatase 58  39 - 117 U/L   AST 19  0 - 37 U/L   ALT 18  0 - 35 U/L   Total Protein 6.8  6.0 - 8.3 g/dL   Albumin 3.8  3.5 - 5.2 g/dL   Calcium 9.3  8.4 - 10.5 mg/dL   GFR 88.60  >60.00 mL/min  LIPID PANEL      Result Value Ref Range   Cholesterol 167  0 -  200 mg/dL   Triglycerides 58.0  0.0 - 149.0 mg/dL   HDL 67.20  >39.00 mg/dL   VLDL 11.6  0.0 - 40.0 mg/dL   LDL Cholesterol 88  0 - 99 mg/dL   Total CHOL/HDL Ratio 2    TSH      Result Value Ref Range   TSH 1.37  0.35 - 5.50 uIU/mL  VITAMIN D 25 HYDROXY      Result Value Ref Range   Vit D, 25-Hydroxy 18 (*) 30 - 89 ng/mL    Overall cholesterol was good - even with suboptimal diet   Patient Active Problem List   Diagnosis Date Noted  . Breast cancer screening, high risk patient 10/18/2011  . Routine general medical examination at a health care facility 07/02/2011  . Encounter for routine gynecological examination 07/02/2011  . Irregular heart beat 04/09/2011  . 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 HYPOKALEMIA 04/22/2009  . UNSPECIFIED VITAMIN D DEFICIENCY 02/24/2009  . STRESS REACTION, ACUTE, WITH EMOTIONAL DISTURBANCE 11/25/2007  . OSTEOPENIA 01/14/2007  . EDEMA 01/14/2007   Past Medical History  Diagnosis Date  . Edema   . 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 Family history of malignant neoplasm of breast     evista for BRCA ppx  . Hypopotassemia   . Neoplasm of uncertain behavior of skin   . Disorder of bone and cartilage, unspecified   . Other screening mammogram   . Routine gynecological examination   . Predominant disturbance of emotions   . Unspecified vitamin D deficiency   . Colon polyp 12/2007  . Fibrocystic breast   .  Bradycardia 9/12    mild - at one time with PACs/ asymptomatic   . Breast cancer screening, high risk patient 10/18/2011   Past Surgical History  Procedure Laterality Date  . Tonsillectomy    . Leep      HPV  . Dexa  12/2003    osteopenia  . Dexa  11/07    stable  . Dexa  1/10    Osteopenia-slightly improved  . Breast biopsy  01/2010  . Breast mri  01/2010    Normal  . Breast biopsy  8/11    Fibrocystic change  . Cataracts  9/12    bilateral  . Procedure for "bad pap"     History  Substance Use Topics  . Smoking status: Former Smoker    Quit date: 07/23/1985  . Smokeless tobacco: Never Used     Comment: Quit "years ago"  . Alcohol Use: Yes     Comment: Occasional   Family History  Problem Relation Age of Onset  . Diabetes Father   . Skin cancer Father     ?  Marland Kitchen Breast cancer Mother   . Osteoporosis Mother   . Osteopenia Sister   . Breast cancer Sister   . Breast cancer      cousin/Grandmother  . Heart attack Paternal Grandfather   . Colon cancer Neg Hx    Allergies  Allergen Reactions  . Alendronate Sodium     REACTION: leg and joint pain   Current Outpatient Prescriptions on File Prior to Visit  Medication Sig Dispense Refill  . Calcium 1200-1000 MG-UNIT CHEW Chew 1 tablet by mouth daily.        . cholecalciferol (VITAMIN D) 1000 UNITS tablet Take 2,000 Units by mouth daily.       Marland Kitchen EVISTA 60 MG tablet TAKE 1 TABLET (60 MG TOTAL)  BY MOUTH DAILY.  90 tablet  3  . naproxen sodium (ANAPROX) 220 MG tablet Take 220 mg by mouth daily.       Marland Kitchen OVER THE COUNTER MEDICATION Chewable vitamin C      . acetaminophen (TYLENOL) 650 MG CR tablet Take 650 mg by mouth as needed.      . calcium carbonate (TUMS - DOSED IN MG ELEMENTAL CALCIUM) 500 MG chewable tablet Chew 2 tablets by mouth as needed.        No current facility-administered medications on file prior to visit.     Review of Systems Review of Systems  Constitutional: Negative for fever, appetite change,  fatigue and unexpected weight change.  Eyes: Negative for pain and visual disturbance.  Respiratory: Negative for cough and shortness of breath.   Cardiovascular: Negative for cp or palpitations    Gastrointestinal: Negative for nausea, diarrhea and constipation.  Genitourinary: Negative for urgency and frequency.  Skin: Negative for pallor or rash   Neurological: Negative for weakness, light-headedness, numbness and headaches.  Hematological: Negative for adenopathy. Does not bruise/bleed easily.  Psychiatric/Behavioral: Negative for dysphoric mood. The patient is not nervous/anxious.         Objective:   Physical Exam  Constitutional: She appears well-developed and well-nourished. No distress.  obese and well appearing   HENT:  Head: Normocephalic and atraumatic.  Right Ear: External ear normal.  Left Ear: External ear normal.  Mouth/Throat: Oropharynx is clear and moist.  Eyes: Conjunctivae and EOM are normal. Pupils are equal, round, and reactive to light. No scleral icterus.  Neck: Normal range of motion. Neck supple. No JVD present. Carotid bruit is not present. No thyromegaly present.  Cardiovascular: Normal rate, regular rhythm, normal heart sounds and intact distal pulses.  Exam reveals no gallop.   Pulmonary/Chest: Effort normal and breath sounds normal. No respiratory distress. She has no wheezes. She exhibits no tenderness.  Abdominal: Soft. Bowel sounds are normal. She exhibits no distension, no abdominal bruit and no mass. There is no tenderness.  Genitourinary: No breast swelling, tenderness, discharge or bleeding.  Breast exam: No mass, nodules, thickening, tenderness, bulging, retraction, inflamation, nipple discharge or skin changes noted.  No axillary or clavicular LA.      Musculoskeletal: Normal range of motion. She exhibits no edema and no tenderness.  Lymphadenopathy:    She has no cervical adenopathy.  Neurological: She is alert. She has normal reflexes. No  cranial nerve deficit. She exhibits normal muscle tone. Coordination normal.  Skin: Skin is warm and dry. No rash noted. No erythema. No pallor.  Psychiatric: She has a normal mood and affect.          Assessment & Plan:

## 2013-10-02 NOTE — Patient Instructions (Addendum)
If you are interested in a shingles/zoster vaccine - call your insurance to check on coverage,( you should not get it within 1 month of other vaccines) , then call us for a prescription  for it to take to a pharmacy that gives the shot , or make a nurse visit to get it here depending on your coverage  stop up front for bone density referral  Take vitamin D 50,000 iu weekly for 3 months please  Continue your regular calcium and D on top of that  Keep walking

## 2013-10-02 NOTE — Progress Notes (Signed)
Pre visit review using our clinic review tool, if applicable. No additional management support is needed unless otherwise documented below in the visit note. 

## 2013-10-03 NOTE — Assessment & Plan Note (Signed)
Schedule dexa D level is low- will give extra 50,000 iu weekly for 12 weeks  No fx Disc need for calcium/ vitamin D/ wt bearing exercise and bone density test every 2 y to monitor Disc safety/ fracture risk in detail   On evista for this and also strong fam hx of breast ca

## 2013-10-03 NOTE — Assessment & Plan Note (Signed)
Vitamin D level is low with current supplementation Will add addl 50,000 iu weekly for 12 weeks  Disc importance of this to bone and overall health

## 2013-10-03 NOTE — Assessment & Plan Note (Signed)
Reviewed health habits including diet and exercise and skin cancer prevention Reviewed appropriate screening tests for age  Also reviewed health mt list, fam hx and immunization status , as well as social and family history   Labs rev  Enc exercise adn wt loss

## 2013-10-14 ENCOUNTER — Encounter: Payer: Self-pay | Admitting: Oncology

## 2013-10-14 ENCOUNTER — Ambulatory Visit: Payer: 59 | Admitting: Oncology

## 2013-10-14 ENCOUNTER — Ambulatory Visit (HOSPITAL_BASED_OUTPATIENT_CLINIC_OR_DEPARTMENT_OTHER): Payer: 59 | Admitting: Oncology

## 2013-10-14 VITALS — BP 161/77 | HR 66 | Temp 98.0°F | Resp 18 | Ht 64.5 in | Wt 213.3 lb

## 2013-10-14 DIAGNOSIS — Z803 Family history of malignant neoplasm of breast: Secondary | ICD-10-CM

## 2013-10-14 DIAGNOSIS — Z1501 Genetic susceptibility to malignant neoplasm of breast: Secondary | ICD-10-CM

## 2013-10-14 DIAGNOSIS — Z1239 Encounter for other screening for malignant neoplasm of breast: Secondary | ICD-10-CM

## 2013-10-14 DIAGNOSIS — E559 Vitamin D deficiency, unspecified: Secondary | ICD-10-CM

## 2013-10-14 NOTE — Patient Instructions (Signed)
Doing well  Continue evista daily  Continue the vitamin D per your PCP  I will see you back in 1 year

## 2013-10-14 NOTE — Progress Notes (Signed)
[facility] OFFICE PROGRESS NOTE  Patient Care Team: Abner Greenspan, MD as PCP - General  DIAGNOSIS: 64 year old female with family history of breast cancer on Evista for breast cancer risk reduction.   SUMMARY OF ONCOLOGIC HISTORY: #1 patient has been having breast MRIs performed on a yearly basis.  #2 Evista 60 mg daily.But patient has not taken Evista for quite some time.  #3 left hip pain Patient has had cortisone injections done to  CURRENT THERAPY:Evista 60 mg daily   INTERVAL HISTORY: Denise Ali 63 y.o. female returns for followup visit today. She has a strong family history of breast cancer. She herself does not have a personal history of breast cancer. She is here regarding risk reduction. She has been on Evista. She does not seem to be very compliant with it however she does tell me today that she's been taking it on a more regular basis over the last few months. She is feeling well. She is having some blurry vision. She is planning on skull and to see her ophthalmologist. She is denying any aches and pains hot flashes. No nausea or vomiting remainder of the 10 point review of systems is negative.  I have reviewed the past medical history, past surgical history, social history and family history with the patient and they are unchanged from previous note.  ALLERGIES:  is allergic to alendronate sodium.  MEDICATIONS:  Current Outpatient Prescriptions  Medication Sig Dispense Refill  . acetaminophen (TYLENOL) 650 MG CR tablet Take 650 mg by mouth as needed.      . Calcium 1200-1000 MG-UNIT CHEW Chew 1 tablet by mouth daily.        . calcium carbonate (TUMS - DOSED IN MG ELEMENTAL CALCIUM) 500 MG chewable tablet Chew 2 tablets by mouth as needed.       . cholecalciferol (VITAMIN D) 1000 UNITS tablet Take 2,000 Units by mouth daily.       . ergocalciferol (VITAMIN D2) 50000 UNITS capsule Take 1 capsule (50,000 Units total) by mouth once a week.  12 capsule  0   . EVISTA 60 MG tablet TAKE 1 TABLET (60 MG TOTAL) BY MOUTH DAILY.  90 tablet  3  . naproxen sodium (ANAPROX) 220 MG tablet Take 220 mg by mouth daily as needed.       Marland Kitchen OVER THE COUNTER MEDICATION Chewable vitamin C       No current facility-administered medications for this visit.    REVIEW OF SYSTEMS:   Constitutional: Denies fevers, chills or abnormal weight loss Eyes: some blurring of vision after being on the computer for a while Ears, nose, mouth, throat, and face: Denies mucositis or sore throat Respiratory: Denies cough, dyspnea or wheezes Cardiovascular: Denies palpitation, chest discomfort or lower extremity swelling Gastrointestinal:  Denies nausea, heartburn or change in bowel habits Skin: Denies abnormal skin rashes Lymphatics: Denies new lymphadenopathy or easy bruising Neurological:Denies numbness, tingling or new weaknesses Behavioral/Psych: Mood is stable, no new changes  All other systems were reviewed with the patient and are negative.  PHYSICAL EXAMINATION: ECOG PERFORMANCE STATUS: 0 - Asymptomatic  Filed Vitals:   10/14/13 1251  BP: 161/77  Pulse: 66  Temp: 98 F (36.7 C)  Resp: 18   Filed Weights   10/14/13 1251  Weight: 213 lb 4.8 oz (96.752 kg)    GENERAL:alert, no distress and comfortable SKIN: skin color, texture, turgor are normal, no rashes or significant lesions EYES: normal, Conjunctiva are pink and non-injected, sclera  clear OROPHARYNX:no exudate, no erythema and lips, buccal mucosa, and tongue normal  NECK: supple, thyroid normal size, non-tender, without nodularity LYMPH:  no palpable lymphadenopathy in the cervical, axillary or inguinal LUNGS: clear to auscultation and percussion with normal breathing effort HEART: regular rate & rhythm and no murmurs and no lower extremity edema ABDOMEN:abdomen soft, non-tender and normal bowel sounds Musculoskeletal:no cyanosis of digits and no clubbing  NEURO: alert & oriented x 3 with fluent  speech, no focal motor/sensory deficits Breasts: breasts appear normal, no suspicious masses, no skin or nipple changes or axillary nodes.  LABORATORY DATA:  I have reviewed the data as listed    Component Value Date/Time   NA 140 09/25/2013 0823   NA 140 02/16/2013 0755   NA 140 04/13/2010 1404   K 4.6 09/25/2013 0823   K 4.0 02/16/2013 0755   K 4.6 04/13/2010 1404   CL 107 09/25/2013 0823   CL 107 10/17/2012 1050   CL 99 04/13/2010 1404   CO2 28 09/25/2013 0823   CO2 24 02/16/2013 0755   CO2 29 04/13/2010 1404   GLUCOSE 100* 09/25/2013 0823   GLUCOSE 111 02/16/2013 0755   GLUCOSE 99 10/17/2012 1050   GLUCOSE 107 04/13/2010 1404   BUN 16 09/25/2013 0823   BUN 14.4 02/16/2013 0755   BUN 15 04/13/2010 1404   CREATININE 0.7 09/25/2013 0823   CREATININE 0.8 02/16/2013 0755   CREATININE 0.6 04/13/2010 1404   CALCIUM 9.3 09/25/2013 0823   CALCIUM 9.2 02/16/2013 0755   CALCIUM 9.3 04/13/2010 1404   PROT 6.8 09/25/2013 0823   PROT 6.7 02/16/2013 0755   PROT 6.5 04/13/2010 1404   ALBUMIN 3.8 09/25/2013 0823   ALBUMIN 3.5 02/16/2013 0755   AST 19 09/25/2013 0823   AST 15 02/16/2013 0755   AST 19 04/13/2010 1404   ALT 18 09/25/2013 0823   ALT 7 02/16/2013 0755   ALT 13 04/13/2010 1404   ALKPHOS 58 09/25/2013 0823   ALKPHOS 65 02/16/2013 0755   ALKPHOS 59 04/13/2010 1404   BILITOT 0.7 09/25/2013 0823   BILITOT 0.58 02/16/2013 0755   BILITOT 0.80 04/13/2010 1404   GFRNONAA >90 02/04/2012 1048   GFRAA >90 02/04/2012 1048    No results found for this basename: SPEP, UPEP,  kappa and lambda light chains    Lab Results  Component Value Date   WBC 5.6 09/25/2013   NEUTROABS 3.3 09/25/2013   HGB 12.6 09/25/2013   HCT 37.5 09/25/2013   MCV 90.9 09/25/2013   PLT 220.0 09/25/2013      Chemistry      Component Value Date/Time   NA 140 09/25/2013 0823   NA 140 02/16/2013 0755   NA 140 04/13/2010 1404   K 4.6 09/25/2013 0823   K 4.0 02/16/2013 0755   K 4.6 04/13/2010 1404   CL 107 09/25/2013 0823   CL 107 10/17/2012 1050   CL 99 04/13/2010 1404    CO2 28 09/25/2013 0823   CO2 24 02/16/2013 0755   CO2 29 04/13/2010 1404   BUN 16 09/25/2013 0823   BUN 14.4 02/16/2013 0755   BUN 15 04/13/2010 1404   CREATININE 0.7 09/25/2013 0823   CREATININE 0.8 02/16/2013 0755   CREATININE 0.6 04/13/2010 1404      Component Value Date/Time   CALCIUM 9.3 09/25/2013 0823   CALCIUM 9.2 02/16/2013 0755   CALCIUM 9.3 04/13/2010 1404   ALKPHOS 58 09/25/2013 0823   ALKPHOS 65 02/16/2013 0755   ALKPHOS  59 04/13/2010 1404   AST 19 09/25/2013 0823   AST 15 02/16/2013 0755   AST 19 04/13/2010 1404   ALT 18 09/25/2013 0823   ALT 7 02/16/2013 0755   ALT 13 04/13/2010 1404   BILITOT 0.7 09/25/2013 0823   BILITOT 0.58 02/16/2013 0755   BILITOT 0.80 04/13/2010 1404       RADIOGRAPHIC STUDIES: I have personally reviewed the radiological images as listed and agreed with the findings in the report. No results found.    ASSESSMENT & PLAN:  63 year old female with  #1. Lifetime risk of developing breast cancer spent 29%. Therefore patient has been known breast cancer risk reduction therapy consisting of Evista 60 mg daily. Overall she seems to be tolerating it well. She is encouraged to continue to take it on a regular basis. Patient has also been getting mammograms and MRIs on a yearly basis. Her next radiologic studies will be in July 2015.  #2. Low vitamin D levels: Patient is now on vitamin D 50,000 units weekly. She will continue to followup for this by her primary care physician.  #3 followup: Patient wants to be seen on an annual basis. I will see her back in one year. We discussed exercise eating healthy and weight loss as part of her risk reduction. She also understands that she needs to maintain a good BMI is an extensive discussion regarding this.   No orders of the defined types were placed in this encounter.   All questions were answered. The patient knows to call the clinic with any problems, questions or concerns. No barriers to learning was detected. I spent 15  minutes counseling the patient face to face. The total time spent in the appointment was 30 minutes and more than 50% was on counseling and review of test results     Marcy Panning, MD 10/14/2013 1:12 PM

## 2013-10-15 ENCOUNTER — Telehealth: Payer: Self-pay | Admitting: *Deleted

## 2013-10-15 NOTE — Telephone Encounter (Signed)
Per 3/25 POF I have scheduled appt. I have called and left patientt message. Calendar sent

## 2013-11-24 ENCOUNTER — Ambulatory Visit (INDEPENDENT_AMBULATORY_CARE_PROVIDER_SITE_OTHER)
Admission: RE | Admit: 2013-11-24 | Discharge: 2013-11-24 | Disposition: A | Discharge status: Home / Self Care | Payer: 59 | Source: Ambulatory Visit | Attending: Family Medicine | Admitting: Family Medicine

## 2013-11-24 DIAGNOSIS — M899 Disorder of bone, unspecified: Secondary | ICD-10-CM

## 2013-11-24 DIAGNOSIS — M949 Disorder of cartilage, unspecified: Principal | ICD-10-CM

## 2013-11-24 LAB — HM DEXA SCAN

## 2013-12-08 ENCOUNTER — Encounter: Payer: Self-pay | Admitting: Family Medicine

## 2013-12-08 ENCOUNTER — Encounter: Payer: Self-pay | Admitting: *Deleted

## 2013-12-20 ENCOUNTER — Other Ambulatory Visit: Payer: Self-pay | Admitting: Family Medicine

## 2013-12-21 NOTE — Telephone Encounter (Signed)
We do not need to refill this -she finished her course Now I want to have her continue otc D at 2000 iu daily  Check D level sometime this summer please

## 2013-12-21 NOTE — Telephone Encounter (Signed)
Last office visit 10/02/2013.  Ok to refill? 

## 2013-12-23 NOTE — Telephone Encounter (Signed)
Rx declined and left voicemail requesting pt to call back so I can advise her of Dr. Marliss Coots comments

## 2013-12-23 NOTE — Telephone Encounter (Signed)
[facility] left v/m requesting status of Vit D refill. [facility]

## 2013-12-23 NOTE — Telephone Encounter (Signed)
Pt notified she didn't need Rx refilled Pt advise to keep taking OTC vit D 2000 iu Lab appt scheduled for 02/04/14

## 2014-02-01 ENCOUNTER — Other Ambulatory Visit: Payer: Self-pay | Admitting: Family Medicine

## 2014-02-01 DIAGNOSIS — E559 Vitamin D deficiency, unspecified: Secondary | ICD-10-CM

## 2014-02-04 ENCOUNTER — Other Ambulatory Visit: Payer: 59

## 2014-02-04 ENCOUNTER — Other Ambulatory Visit (INDEPENDENT_AMBULATORY_CARE_PROVIDER_SITE_OTHER): Payer: 59

## 2014-02-04 DIAGNOSIS — E559 Vitamin D deficiency, unspecified: Secondary | ICD-10-CM

## 2014-02-04 LAB — VITAMIN D 25 HYDROXY (VIT D DEFICIENCY, FRACTURES): VITD: 33.81 ng/mL

## 2014-02-05 ENCOUNTER — Encounter: Payer: Self-pay | Admitting: *Deleted

## 2014-02-25 ENCOUNTER — Other Ambulatory Visit: Payer: Self-pay | Admitting: Family Medicine

## 2014-03-12 ENCOUNTER — Encounter: Payer: Self-pay | Admitting: Family Medicine

## 2014-03-15 ENCOUNTER — Encounter: Payer: Self-pay | Admitting: *Deleted

## 2014-05-12 ENCOUNTER — Telehealth: Payer: Self-pay | Admitting: Hematology and Oncology

## 2014-05-12 NOTE — Telephone Encounter (Signed)
S/w pt advised appt chg from 3/231 due to md pal. Pt states ok to make appt any day except Mon afternoons and mail calendar. Made appt for 4/28 @ 3pm and mailed appt calendar.

## 2014-08-17 ENCOUNTER — Encounter: Payer: Self-pay | Admitting: Family Medicine

## 2014-08-17 ENCOUNTER — Ambulatory Visit (INDEPENDENT_AMBULATORY_CARE_PROVIDER_SITE_OTHER): Payer: 59 | Admitting: Family Medicine

## 2014-08-17 VITALS — BP 150/80 | HR 77 | Temp 98.9°F | Ht 64.5 in | Wt 209.8 lb

## 2014-08-17 DIAGNOSIS — B9789 Other viral agents as the cause of diseases classified elsewhere: Principal | ICD-10-CM

## 2014-08-17 DIAGNOSIS — J069 Acute upper respiratory infection, unspecified: Secondary | ICD-10-CM

## 2014-08-17 NOTE — Assessment & Plan Note (Signed)
Symptomatic care. If not improving  Towards 7-10 days of illness, consider antibiotic treatment.

## 2014-08-17 NOTE — Progress Notes (Signed)
Pre visit review using our clinic review tool, if applicable. No additional management support is needed unless otherwise documented below in the visit note. 

## 2014-08-17 NOTE — Patient Instructions (Signed)
Mucinex DM twice daily for cough. Nasal saline spray 2-3 times  A day or can use nasal steroid spray like OTC flonase daily for post nasal drip. Call if not improving in next 3-4 days as expected for [place] to consider bacterial infection as a etiology.

## 2014-08-17 NOTE — Progress Notes (Signed)
   Subjective:    Patient ID: Denise Ali, female    DOB: 1950/09/14, 64 y.o.   MRN: [id number]  Cough This is a new problem. The current episode started in the past 7 days. The problem has been gradually worsening. The problem occurs constantly. The cough is non-productive. Associated symptoms include chills and postnasal drip. Pertinent negatives include no ear congestion, nasal congestion, rash or wheezing. Associated symptoms comments: Sore glands. Risk factors: former remote smoker. Treatments tried: tylenol. The treatment provided mild relief. There is no history of asthma, bronchiectasis, bronchitis, COPD, emphysema, environmental allergies or pneumonia.      Review of Systems  Constitutional: Positive for chills.  HENT: Positive for postnasal drip.   Respiratory: Positive for cough. Negative for wheezing.   Skin: Negative for rash.  Allergic/Immunologic: Negative for environmental allergies.       Objective:   Physical Exam  Constitutional: Vital signs are normal. She appears well-developed and well-nourished. She is cooperative.  Non-toxic appearance. She does not appear ill. No distress.  HENT:  Head: Normocephalic.  Right Ear: Hearing, tympanic membrane, external ear and ear canal normal. Tympanic membrane is not erythematous, not retracted and not bulging.  Left Ear: Hearing, tympanic membrane, external ear and ear canal normal. Tympanic membrane is not erythematous, not retracted and not bulging.  Nose: Mucosal edema and rhinorrhea present. Right sinus exhibits no maxillary sinus tenderness and no frontal sinus tenderness. Left sinus exhibits no maxillary sinus tenderness and no frontal sinus tenderness.  Mouth/Throat: Uvula is midline, oropharynx is clear and moist and mucous membranes are normal.  Eyes: Conjunctivae, EOM and lids are normal. Pupils are equal, round, and reactive to light. Lids are everted and swept, no foreign bodies found.  Neck: Trachea normal  and normal range of motion. Neck supple. Carotid bruit is not present. No thyroid mass and no thyromegaly present.  Cardiovascular: Normal rate, regular rhythm, S1 normal, S2 normal, normal heart sounds, intact distal pulses and normal pulses.  Exam reveals no gallop and no friction rub.   No murmur heard. Pulmonary/Chest: Effort normal and breath sounds normal. No tachypnea. No respiratory distress. She has no decreased breath sounds. She has no wheezes. She has no rhonchi. She has no rales.  Neurological: She is alert.  Skin: Skin is warm, dry and intact. No rash noted.  Psychiatric: Her speech is normal and behavior is normal. Judgment normal. Her mood appears not anxious. Cognition and memory are normal. She does not exhibit a depressed mood.          Assessment & Plan:

## 2014-08-20 ENCOUNTER — Telehealth: Payer: Self-pay | Admitting: Family Medicine

## 2014-08-20 MED ORDER — AMOXICILLIN 500 MG PO CAPS: 1000.0000 mg | ORAL_CAPSULE | Freq: Two times a day (BID) | ORAL | Status: DC

## 2014-08-20 NOTE — Telephone Encounter (Signed)
Pt called stating her left eye was crusted over this morning and left ear is still hurting.  She wanted to know if dr Diona Browner would call her in something  [facility]

## 2014-08-20 NOTE — Telephone Encounter (Signed)
Denise Ali notified prescription for antibiotics has been sent to [facility].

## 2014-08-20 NOTE — Telephone Encounter (Signed)
Sent rx for amox to cover ear inf /sinus infection. Just see 1/26, no better.

## 2014-08-27 ENCOUNTER — Ambulatory Visit (INDEPENDENT_AMBULATORY_CARE_PROVIDER_SITE_OTHER): Payer: 59 | Admitting: Internal Medicine

## 2014-08-27 ENCOUNTER — Telehealth: Payer: Self-pay

## 2014-08-27 ENCOUNTER — Encounter: Payer: Self-pay | Admitting: Internal Medicine

## 2014-08-27 VITALS — BP 126/84 | HR 64 | Temp 98.0°F | Wt 206.0 lb

## 2014-08-27 DIAGNOSIS — H66002 Acute suppurative otitis media without spontaneous rupture of ear drum, left ear: Secondary | ICD-10-CM

## 2014-08-27 MED ORDER — TREAGAN 5.4-1.4-0.0097 % OT SOLN: 1.0000 [drp] | Freq: Four times a day (QID) | OTIC | Status: DC

## 2014-08-27 NOTE — Telephone Encounter (Signed)
Spoke with Webb Silversmith NP and Rollene Fare spoke with [facility] and there is no substitute in the same class for Treagan sol. Pt is to continue the ibuprofen,flonase,amoxicillin and pt is to cb on 08/30/14 if no better and Rollene Fare will consider changing the oral abx. Pt voiced understanding.

## 2014-08-27 NOTE — Telephone Encounter (Signed)
Pt left v/m; pt was seen earlier today and [facility] told pt no longer makes Treagan sol. Pt request substitute med sent to [facility]. Pt request cb.

## 2014-08-27 NOTE — Progress Notes (Signed)
Subjective:    Patient ID: Denise Ali, female    DOB: 1951/02/01, 64 y.o.   MRN: [id number]  HPI  Pt presents to the clinic today with c/o difficulty hearing oft of left ear. She reports her symptoms started 2 weeks ago. She was seen for a URI 08/17/14. She was initially treated for a viral infection with supportive care. She called back 08/20/14 feeling worse and was prescribed Amoxil x 10 days. She reports she has been taking the antibiotic as prescribed and most of her symptoms have cleared up but the left ear pain has gotten worse. She describes it as a dull achy, but intermittenttly it seems to be sharp and stabbing. She has had some associated hearing loss of the left ear. She has not noticed any drainage from the ear. She denies head trauma. She has taken Tylenol in addition to the antibiotic.  Review of Systems      Past Medical History  Diagnosis Date  . Edema   . 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 Family history of malignant neoplasm of breast     evista for BRCA ppx  . Hypopotassemia   . Neoplasm of uncertain behavior of skin   . Disorder of bone and cartilage, unspecified   . Other screening mammogram   . Routine gynecological examination   . Predominant disturbance of emotions   . Unspecified vitamin D deficiency   . Colon polyp 12/2007  . Fibrocystic breast   . Bradycardia 9/12    mild - at one time with PACs/ asymptomatic   . Breast cancer screening, high risk patient 10/18/2011    Current Outpatient Prescriptions  Medication Sig Dispense Refill  . acetaminophen (TYLENOL) 650 MG CR tablet Take 650 mg by mouth as needed.    Marland Kitchen amoxicillin (AMOXIL) 500 MG capsule Take 2 capsules (1,000 mg total) by mouth 2 (two) times daily. 40 capsule 0  . Calcium 1200-1000 MG-UNIT CHEW Chew 1 tablet by mouth daily.      . calcium carbonate (TUMS - DOSED IN MG ELEMENTAL CALCIUM) 500 MG chewable tablet Chew 2 tablets by mouth as  needed.     . cholecalciferol (VITAMIN D) 1000 UNITS tablet Take 2,000 Units by mouth daily.     . naproxen sodium (ANAPROX) 220 MG tablet Take 220 mg by mouth daily as needed.     . raloxifene (EVISTA) 60 MG tablet TAKE 1 TABLET (60 MG TOTAL) BY MOUTH DAILY. 90 tablet 1   No current facility-administered medications for this visit.    Allergies  Allergen Reactions  . Alendronate Sodium Other (See Comments)    REACTION: leg and joint pain    Family History  Problem Relation Age of Onset  . Diabetes Father   . Skin cancer Father     ?  Marland Kitchen Breast cancer Mother   . Osteoporosis Mother   . Osteopenia Sister   . Breast cancer Sister   . Breast cancer      cousin/Grandmother  . Heart attack Paternal Grandfather   . Colon cancer Neg Hx     History   Social History  . Marital Status: Divorced    Spouse Name: N/A    Number of Children: 1  . Years of Education: N/A   Occupational History  . Social Services-works at [employer]    Social History Main Topics  . Smoking status: Former Smoker    Quit date: 07/23/1985  . Smokeless  tobacco: Never Used     Comment: Quit "years ago"  . Alcohol Use: Yes     Comment: Occasional  . Drug Use: No  . Sexual Activity: Yes   Other Topics Concern  . Not on file   Social History Narrative   Works very long hours      Quit smoking year ago              Constitutional: Denies fever, malaise, fatigue, headache or abrupt weight changes.  HEENT: Pt reports left ear pain. Denies eye pain, eye redness, ringing in the ears, wax buildup, runny nose, nasal congestion, bloody nose, or sore throat. Respiratory: Denies difficulty breathing, shortness of breath, cough or sputum production.   Cardiovascular: Denies chest pain, chest tightness, palpitations or swelling in the hands or feet.  Skin: Denies redness, rashes, lesions or ulcercations.  Neurological: Denies dizziness, difficulty with memory, difficulty with speech or problems with  balance and coordination.   No other specific complaints in a complete review of systems (except as listed in HPI above).  Objective:   Physical Exam   BP 126/84 mmHg  Pulse 64  Temp(Src) 98 F (36.7 C) (Oral)  Wt 206 lb (93.441 kg)  SpO2 98% Wt Readings from Last 3 Encounters:  08/27/14 206 lb (93.441 kg)  08/17/14 209 lb 12 oz (95.142 kg)  10/14/13 213 lb 4.8 oz (96.752 kg)    General: Appears her stated age, well developed, well nourished in NAD. Skin: Warm, dry and intact. No rashes, lesions or ulcerations noted. HEENT: Head: normal shape and size, no sinus tenderness noted; Eyes: sclera slightly injected, no icterus, conjunctiva pink;  Right Ears: Tm pink but intact, normal light reflex; Left Ear: TM red, bulging, distorted light reflex, + effusion. Neck: No adenopathy noted. Cardiovascular: Normal rate and rhythm. S1,S2 noted.  No murmur, rubs or gallops noted.  Pulmonary/Chest: Normal effort and positive vesicular breath sounds. No respiratory distress. No wheezes, rales or ronchi noted.  ANeurological: Alert and oriented. Coordination normal.  BMET    Component Value Date/Time   NA 140 09/25/2013 0823   NA 140 02/16/2013 0755   NA 140 04/13/2010 1404   K 4.6 09/25/2013 0823   K 4.0 02/16/2013 0755   K 4.6 04/13/2010 1404   CL 107 09/25/2013 0823   CL 107 10/17/2012 1050   CL 99 04/13/2010 1404   CO2 28 09/25/2013 0823   CO2 24 02/16/2013 0755   CO2 29 04/13/2010 1404   GLUCOSE 100* 09/25/2013 0823   GLUCOSE 111 02/16/2013 0755   GLUCOSE 99 10/17/2012 1050   GLUCOSE 107 04/13/2010 1404   BUN 16 09/25/2013 0823   BUN 14.4 02/16/2013 0755   BUN 15 04/13/2010 1404   CREATININE 0.7 09/25/2013 0823   CREATININE 0.8 02/16/2013 0755   CREATININE 0.6 04/13/2010 1404   CALCIUM 9.3 09/25/2013 0823   CALCIUM 9.2 02/16/2013 0755   CALCIUM 9.3 04/13/2010 1404   GFRNONAA >90 02/04/2012 1048   GFRAA >90 02/04/2012 1048    Lipid Panel     Component Value Date/Time    CHOL 167 09/25/2013 0823   TRIG 58.0 09/25/2013 0823   HDL 67.20 09/25/2013 0823   CHOLHDL 2 09/25/2013 0823   VLDL 11.6 09/25/2013 0823   LDLCALC 88 09/25/2013 0823    CBC    Component Value Date/Time   WBC 5.6 09/25/2013 0823   WBC 5.4 02/16/2013 0755   WBC 6.6 04/13/2010 1404   RBC 4.13 09/25/2013 0823  RBC 4.08 02/16/2013 0755   RBC 3.86 04/13/2010 1404   HGB 12.6 09/25/2013 0823   HGB 12.3 02/16/2013 0755   HGB 12.2 04/13/2010 1404   HCT 37.5 09/25/2013 0823   HCT 37.0 02/16/2013 0755   HCT 34.9 04/13/2010 1404   PLT 220.0 09/25/2013 0823   PLT 209 02/16/2013 0755   PLT 205 04/13/2010 1404   MCV 90.9 09/25/2013 0823   MCV 90.7 02/16/2013 0755   MCV 91 04/13/2010 1404   MCH 30.1 02/16/2013 0755   MCH 31.5 04/13/2010 1404   MCHC 33.7 09/25/2013 0823   MCHC 33.2 02/16/2013 0755   MCHC 34.8 04/13/2010 1404   RDW 13.9 09/25/2013 0823   RDW 13.4 02/16/2013 0755   RDW 12.7 04/13/2010 1404   LYMPHSABS 1.5 09/25/2013 0823   LYMPHSABS 1.7 02/16/2013 0755   LYMPHSABS 2.1 04/13/2010 1404   MONOABS 0.5 09/25/2013 0823   MONOABS 0.5 02/16/2013 0755   EOSABS 0.1 09/25/2013 0823   EOSABS 0.1 02/16/2013 0755   EOSABS 0.1 04/13/2010 1404   BASOSABS 0.0 09/25/2013 0823   BASOSABS 0.0 02/16/2013 0755   BASOSABS 0.0 04/13/2010 1404    Hgb A1C No results found for: HGBA1C      Assessment & Plan:   Left Otitis Media:  Continue the Amoxil until you have taken all that was prescribed Ibuprofen instead of Tylenol for inflammation Get Flonase OTC, use in left nare daily x 1 week eRx for Auralgan- pharmacy called and advised that they do not make this anymore and there is not alternative Advised her to lay on a heated water bladder or heating pad for comfort  If no improvement after she finishes Amoxil, will call in Ceftin.

## 2014-08-27 NOTE — Progress Notes (Signed)
Pre visit review using our clinic review tool, if applicable. No additional management support is needed unless otherwise documented below in the visit note. 

## 2014-08-27 NOTE — Patient Instructions (Signed)

## 2014-08-30 ENCOUNTER — Other Ambulatory Visit: Payer: Self-pay | Admitting: Internal Medicine

## 2014-08-30 MED ORDER — CEFDINIR 300 MG PO CAPS: 300.0000 mg | ORAL_CAPSULE | Freq: Two times a day (BID) | ORAL | Status: DC

## 2014-08-30 NOTE — Telephone Encounter (Signed)
Pt left v/m pt feels like ear infection is worse; feels like the ear is more blocked. Pt request cb. [facility].Please advise.

## 2014-08-30 NOTE — Telephone Encounter (Signed)
Will switch to [employer]

## 2014-09-03 ENCOUNTER — Other Ambulatory Visit: Payer: Self-pay | Admitting: Family Medicine

## 2014-09-27 ENCOUNTER — Telehealth: Payer: Self-pay | Admitting: Family Medicine

## 2014-09-27 DIAGNOSIS — E559 Vitamin D deficiency, unspecified: Secondary | ICD-10-CM

## 2014-09-27 DIAGNOSIS — Z0000 Encounter for general adult medical examination without abnormal findings: Secondary | ICD-10-CM

## 2014-09-27 NOTE — Telephone Encounter (Signed)
-----   Message from Ellamae Sia sent at 09/23/2014  2:32 PM EST ----- Regarding: Lab orders for Tuesday,3.8.16 Patient is scheduled for CPX labs, please order future labs, Thanks , Karna Christmas

## 2014-09-28 ENCOUNTER — Other Ambulatory Visit (INDEPENDENT_AMBULATORY_CARE_PROVIDER_SITE_OTHER): Payer: 59

## 2014-09-28 DIAGNOSIS — E559 Vitamin D deficiency, unspecified: Secondary | ICD-10-CM

## 2014-09-28 DIAGNOSIS — Z0000 Encounter for general adult medical examination without abnormal findings: Secondary | ICD-10-CM

## 2014-09-28 LAB — COMPREHENSIVE METABOLIC PANEL
ALBUMIN: 4 g/dL (ref 3.5–5.2)
ALK PHOS: 63 U/L (ref 39–117)
ALT: 15 U/L (ref 0–35)
AST: 16 U/L (ref 0–37)
BUN: 19 mg/dL (ref 6–23)
CHLORIDE: 105 meq/L (ref 96–112)
CO2: 29 mEq/L (ref 19–32)
Calcium: 9.4 mg/dL (ref 8.4–10.5)
Creatinine, Ser: 0.7 mg/dL (ref 0.40–1.20)
GFR: 89.77 mL/min (ref >60.00)
Glucose, Bld: 108 mg/dL — ABNORMAL HIGH (ref 70–99)
POTASSIUM: 4.6 meq/L (ref 3.5–5.1)
SODIUM: 138 meq/L (ref 135–145)
Total Bilirubin: 0.5 mg/dL (ref 0.2–1.2)
Total Protein: 7 g/dL (ref 6.0–8.3)

## 2014-09-28 LAB — CBC WITH DIFFERENTIAL/PLATELET
BASOS PCT: 0.6 % (ref 0.0–3.0)
Basophils Absolute: 0 10*3/uL (ref 0.0–0.1)
EOS ABS: 0.1 10*3/uL (ref 0.0–0.7)
EOS PCT: 2.3 % (ref 0.0–5.0)
HCT: 36.8 % (ref 36.0–46.0)
HEMOGLOBIN: 12.7 g/dL (ref 12.0–15.0)
LYMPHS PCT: 27.8 % (ref 12.0–46.0)
Lymphs Abs: 1.7 10*3/uL (ref 0.7–4.0)
MCHC: 34.4 g/dL (ref 30.0–36.0)
MCV: 89.2 fl (ref 78.0–100.0)
MONOS PCT: 9.5 % (ref 3.0–12.0)
Monocytes Absolute: 0.6 10*3/uL (ref 0.1–1.0)
NEUTROS ABS: 3.6 10*3/uL (ref 1.4–7.7)
NEUTROS PCT: 59.8 % (ref 43.0–77.0)
Platelets: 228 10*3/uL (ref 150.0–400.0)
RBC: 4.13 Mil/uL (ref 3.87–5.11)
RDW: 13.8 % (ref 11.5–15.5)
WBC: 6 10*3/uL (ref 4.0–10.5)

## 2014-09-28 LAB — LIPID PANEL
Cholesterol: 180 mg/dL (ref 0–200)
HDL: 66.1 mg/dL (ref >39.00)
LDL CALC: 99 mg/dL (ref 0–99)
NonHDL: 113.9
Total CHOL/HDL Ratio: 3
Triglycerides: 74 mg/dL (ref 0.0–149.0)
VLDL: 14.8 mg/dL (ref 0.0–40.0)

## 2014-09-28 LAB — VITAMIN D 25 HYDROXY (VIT D DEFICIENCY, FRACTURES): VITD: 21.34 ng/mL — ABNORMAL LOW (ref 30.00–100.00)

## 2014-09-28 LAB — TSH: TSH: 2.56 u[IU]/mL (ref 0.35–4.50)

## 2014-10-05 ENCOUNTER — Encounter: Payer: Self-pay | Admitting: Family Medicine

## 2014-10-05 ENCOUNTER — Ambulatory Visit (INDEPENDENT_AMBULATORY_CARE_PROVIDER_SITE_OTHER): Payer: 59 | Admitting: Family Medicine

## 2014-10-05 VITALS — BP 145/90 | HR 63 | Temp 98.4°F | Ht 64.5 in | Wt 206.0 lb

## 2014-10-05 DIAGNOSIS — IMO0001 Reserved for inherently not codable concepts without codable children: Secondary | ICD-10-CM

## 2014-10-05 DIAGNOSIS — E559 Vitamin D deficiency, unspecified: Secondary | ICD-10-CM

## 2014-10-05 DIAGNOSIS — M8580 Other specified disorders of bone density and structure, unspecified site: Secondary | ICD-10-CM

## 2014-10-05 DIAGNOSIS — I10 Essential (primary) hypertension: Secondary | ICD-10-CM | POA: Insufficient documentation

## 2014-10-05 DIAGNOSIS — R030 Elevated blood-pressure reading, without diagnosis of hypertension: Secondary | ICD-10-CM

## 2014-10-05 DIAGNOSIS — Z0000 Encounter for general adult medical examination without abnormal findings: Secondary | ICD-10-CM

## 2014-10-05 MED ORDER — RALOXIFENE HCL 60 MG PO TABS: ORAL_TABLET | ORAL | Status: DC

## 2014-10-05 NOTE — Progress Notes (Signed)
Pre visit review using our clinic review tool, if applicable. No additional management support is needed unless otherwise documented below in the visit note. 

## 2014-10-05 NOTE — Progress Notes (Signed)
Subjective:    Patient ID: Denise Ali, female    DOB: Jul 05, 1951, 64 y.o.   MRN: [id number]  HPI Here for health maintenance exam and to review chronic medical problems    Wt is stable with bmi of 57  Doing pretty well  Had a bad crud- doing better   Offered HIV and Hep C screening - declines   Zoster vaccine - she has not had / has not checked with insurance  Flu shot in hte fall   mammogarm 8/15 nl  No lumps   Pap 3/14 - normal  Had LEEP years ago -all nl paps since  No gyn symptoms  Not sexually active   colonosc 8/14  No problems   Td 9/14   bp is high today BP Readings from Last 3 Encounters:  10/05/14 152/90  08/27/14 126/84  08/17/14 150/80    Had one diet coke today  Sister has HTN  Has not taken naproxen  Ankles do swell a bit   Feels a bit more sluggish than usual Not a lot of exercise lately  Feels fat and blah  Wt is stable but too high   Does not eat healthy- not as much fruit and veggies  She plans to start walking for exercise -and has a gym membership   Osteopenia 5/15  evista 60 mg - thinks this is 4th year  D level is 21  Did take 12 weeks of high dose weekly last year  She takes 2000 iu daily - 4 out of 7 days per week   5/15 dexa   Results for orders placed or performed in visit on 09/28/14  CBC with Differential/Platelet  Result Value Ref Range   WBC 6.0 4.0 - 10.5 K/uL   RBC 4.13 3.87 - 5.11 Mil/uL   Hemoglobin 12.7 12.0 - 15.0 g/dL   HCT 36.8 36.0 - 46.0 %   MCV 89.2 78.0 - 100.0 fl   MCHC 34.4 30.0 - 36.0 g/dL   RDW 13.8 11.5 - 15.5 %   Platelets 228.0 150.0 - 400.0 K/uL   Neutrophils Relative % 59.8 43.0 - 77.0 %   Lymphocytes Relative 27.8 12.0 - 46.0 %   Monocytes Relative 9.5 3.0 - 12.0 %   Eosinophils Relative 2.3 0.0 - 5.0 %   Basophils Relative 0.6 0.0 - 3.0 %   Neutro Abs 3.6 1.4 - 7.7 K/uL   Lymphs Abs 1.7 0.7 - 4.0 K/uL   Monocytes Absolute 0.6 0.1 - 1.0 K/uL   Eosinophils Absolute 0.1 0.0 - 0.7  K/uL   Basophils Absolute 0.0 0.0 - 0.1 K/uL  Comprehensive metabolic panel  Result Value Ref Range   Sodium 138 135 - 145 mEq/L   Potassium 4.6 3.5 - 5.1 mEq/L   Chloride 105 96 - 112 mEq/L   CO2 29 19 - 32 mEq/L   Glucose, Bld 108 (H) 70 - 99 mg/dL   BUN 19 6 - 23 mg/dL   Creatinine, Ser 0.70 0.40 - 1.20 mg/dL   Total Bilirubin 0.5 0.2 - 1.2 mg/dL   Alkaline Phosphatase 63 39 - 117 U/L   AST 16 0 - 37 U/L   ALT 15 0 - 35 U/L   Total Protein 7.0 6.0 - 8.3 g/dL   Albumin 4.0 3.5 - 5.2 g/dL   Calcium 9.4 8.4 - 10.5 mg/dL   GFR 89.77 >60.00 mL/min  Lipid panel  Result Value Ref Range   Cholesterol 180 0 - 200 mg/dL  Triglycerides 74.0 0.0 - 149.0 mg/dL   HDL 66.10 >39.00 mg/dL   VLDL 14.8 0.0 - 40.0 mg/dL   LDL Cholesterol 99 0 - 99 mg/dL   Total CHOL/HDL Ratio 3    NonHDL 113.90   TSH  Result Value Ref Range   TSH 2.56 0.35 - 4.50 uIU/mL  Vit D  25 hydroxy (rtn osteoporosis monitoring)  Result Value Ref Range   VITD 21.34 (L) 30.00 - 100.00 ng/mL     Patient Active Problem List   Diagnosis Date Noted  . Breast cancer screening, high risk patient 10/18/2011  . Routine general medical examination at a health care facility 07/02/2011  . Encounter for routine gynecological examination 07/02/2011  . Irregular heart beat 04/09/2011  . HYPOKALEMIA 04/22/2009  . Vitamin D deficiency 02/24/2009  . STRESS REACTION, ACUTE, WITH EMOTIONAL DISTURBANCE 11/25/2007  . OSTEOPENIA 01/14/2007  . EDEMA 01/14/2007   Past Medical History  Diagnosis Date  . Edema   . 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 Family history of malignant neoplasm of breast     evista for BRCA ppx  . Hypopotassemia   . Neoplasm of uncertain behavior of skin   . Disorder of bone and cartilage, unspecified   . Other screening mammogram   . Routine gynecological examination   . Predominant disturbance of emotions   . Unspecified vitamin D deficiency   . Colon  polyp 12/2007  . Fibrocystic breast   . Bradycardia 9/12    mild - at one time with PACs/ asymptomatic   . Breast cancer screening, high risk patient 10/18/2011   Past Surgical History  Procedure Laterality Date  . Tonsillectomy    . Leep      HPV  . Dexa  12/2003    osteopenia  . Dexa  11/07    stable  . Dexa  1/10    Osteopenia-slightly improved  . Breast biopsy  01/2010  . Breast mri  01/2010    Normal  . Breast biopsy  8/11    Fibrocystic change  . Cataracts  9/12    bilateral  . Procedure for "bad pap"     History  Substance Use Topics  . Smoking status: Former Smoker    Quit date: 07/23/1985  . Smokeless tobacco: Never Used     Comment: Quit "years ago"  . Alcohol Use: Yes     Comment: Occasional   Family History  Problem Relation Age of Onset  . Diabetes Father   . Skin cancer Father     ?  Marland Kitchen Breast cancer Mother   . Osteoporosis Mother   . Osteopenia Sister   . Breast cancer Sister   . Breast cancer      cousin/Grandmother  . Heart attack Paternal Grandfather   . Colon cancer Neg Hx    Allergies  Allergen Reactions  . Alendronate Sodium Other (See Comments)    REACTION: leg and joint pain   Current Outpatient Prescriptions on File Prior to Visit  Medication Sig Dispense Refill  . acetaminophen (TYLENOL) 650 MG CR tablet Take 650 mg by mouth as needed.    . Calcium 1200-1000 MG-UNIT CHEW Chew 1 tablet by mouth daily.      . calcium carbonate (TUMS - DOSED IN MG ELEMENTAL CALCIUM) 500 MG chewable tablet Chew 2 tablets by mouth as needed.     . cholecalciferol (VITAMIN D) 1000 UNITS tablet Take 2,000 Units by mouth daily.     Marland Kitchen  naproxen sodium (ANAPROX) 220 MG tablet Take 220 mg by mouth daily as needed.     . raloxifene (EVISTA) 60 MG tablet TAKE 1 TABLET (60 MG TOTAL) BY MOUTH DAILY. 90 tablet 1   No current facility-administered medications on file prior to visit.     Review of Systems Review of Systems  Constitutional: Negative for fever,  appetite change, fatigue and unexpected weight change.  Eyes: Negative for pain and visual disturbance.  Respiratory: Negative for cough and shortness of breath.   Cardiovascular: Negative for cp or palpitations    Gastrointestinal: Negative for nausea, diarrhea and constipation.  Genitourinary: Negative for urgency and frequency.  Skin: Negative for pallor or rash   Neurological: Negative for weakness, light-headedness, numbness and headaches.  Hematological: Negative for adenopathy. Does not bruise/bleed easily.  Psychiatric/Behavioral: Negative for dysphoric mood. The patient is not nervous/anxious.         Objective:   Physical Exam  Constitutional: She appears well-developed and well-nourished. No distress.  obese and well appearing   HENT:  Head: Normocephalic and atraumatic.  Right Ear: External ear normal.  Left Ear: External ear normal.  Nose: Nose normal.  Mouth/Throat: Oropharynx is clear and moist.  Eyes: Conjunctivae and EOM are normal. Pupils are equal, round, and reactive to light. Right eye exhibits no discharge. Left eye exhibits no discharge. No scleral icterus.  Neck: Normal range of motion. Neck supple. No JVD present. No thyromegaly present.  Cardiovascular: Normal rate, regular rhythm, normal heart sounds and intact distal pulses.  Exam reveals no gallop.   Pulmonary/Chest: Effort normal and breath sounds normal. No respiratory distress. She has no wheezes. She has no rales.  Abdominal: Soft. Bowel sounds are normal. She exhibits no distension and no mass. There is no tenderness.  Genitourinary:  Breast exam: No mass, nodules, thickening, tenderness, bulging, retraction, inflamation, nipple discharge or skin changes noted.  No axillary or clavicular LA.      Musculoskeletal: She exhibits no edema or tenderness.  No kyphosis  Lymphadenopathy:    She has no cervical adenopathy.  Neurological: She is alert. She has normal reflexes. No cranial nerve deficit. She  exhibits normal muscle tone. Coordination normal.  Skin: Skin is warm and dry. No rash noted. No erythema. No pallor.  Psychiatric: She has a normal mood and affect.          Assessment & Plan:   Problem List Items Addressed This Visit      Musculoskeletal and Integument   Osteopenia    dexa 5/15 Has one more year of evista (for fracture risk reduction) No falls or fractures  D is low-will inc to 4000 iu daily Disc need for calcium/ vitamin D/ wt bearing exercise and bone density test every 2 y to monitor Disc safety/ fracture risk in detail          Other   Elevated blood pressure - Primary    This may be essential HTN -has a family hx  Rev lifestyle change  Given copy of DASH diet  Will work on wt loss effort and exercise  F/u 4-6 wk -will start therapy if not improved Rev labs BP Readings from Last 3 Encounters:  10/05/14 145/90  08/27/14 126/84  08/17/14 150/80         Routine general medical examination at a health care facility    Reviewed health habits including diet and exercise and skin cancer prevention Reviewed appropriate screening tests for age  Also reviewed health mt list,  fam hx and immunization status , as well as social and family history   See HPI Labs rev Disc plan for elevated bp  Recommend zoster vaccine if affordable       Vitamin D deficiency    Level in 74s Inc dose to 4000 iu daily-with goal of better compliance Also some outdoor time

## 2014-10-05 NOTE — Patient Instructions (Signed)
Work on healthy eating (see the [place] handout)  Get back to regular exercise  If you are interested in a shingles/zoster vaccine - call your insurance to check on coverage,( you should not get it within 1 month of other vaccines) , then call us for a prescription  for it to take to a pharmacy that gives the shot , or make a nurse visit to get it here depending on your coverage Increase vitamin D to 4000 iu daily - stick with it   Follow up in 4-6 weeks for blood pressure re check  Weight loss will help blood pressure also

## 2014-10-06 NOTE — Assessment & Plan Note (Signed)
Reviewed health habits including diet and exercise and skin cancer prevention Reviewed appropriate screening tests for age  Also reviewed health mt list, fam hx and immunization status , as well as social and family history   See HPI Labs rev Disc plan for elevated bp  Recommend zoster vaccine if affordable

## 2014-10-06 NOTE — Assessment & Plan Note (Signed)
dexa 5/15 Has one more year of evista (for fracture risk reduction) No falls or fractures  D is low-will inc to 4000 iu daily Disc need for calcium/ vitamin D/ wt bearing exercise and bone density test every 2 y to monitor Disc safety/ fracture risk in detail

## 2014-10-06 NOTE — Assessment & Plan Note (Signed)
This may be essential HTN -has a family hx  Rev lifestyle change  Given copy of DASH diet  Will work on wt loss effort and exercise  F/u 4-6 wk -will start therapy if not improved Rev labs BP Readings from Last 3 Encounters:  10/05/14 145/90  08/27/14 126/84  08/17/14 150/80

## 2014-10-06 NOTE — Assessment & Plan Note (Signed)
Level in 20s Inc dose to 4000 iu daily-with goal of better compliance Also some outdoor time

## 2014-10-15 ENCOUNTER — Ambulatory Visit: Payer: 59 | Admitting: Oncology

## 2014-10-21 ENCOUNTER — Ambulatory Visit: Payer: 59 | Admitting: Hematology and Oncology

## 2014-11-17 ENCOUNTER — Ambulatory Visit: Payer: 59 | Admitting: Family Medicine

## 2014-11-18 ENCOUNTER — Ambulatory Visit (HOSPITAL_BASED_OUTPATIENT_CLINIC_OR_DEPARTMENT_OTHER): Payer: 59 | Admitting: Hematology and Oncology

## 2014-11-18 VITALS — BP 147/62 | HR 62 | Temp 98.0°F | Resp 18 | Ht 64.5 in | Wt 206.1 lb

## 2014-11-18 DIAGNOSIS — Z1501 Genetic susceptibility to malignant neoplasm of breast: Secondary | ICD-10-CM | POA: Diagnosis not present

## 2014-11-18 DIAGNOSIS — Z803 Family history of malignant neoplasm of breast: Secondary | ICD-10-CM

## 2014-11-18 DIAGNOSIS — Z1239 Encounter for other screening for malignant neoplasm of breast: Secondary | ICD-10-CM

## 2014-11-18 NOTE — Progress Notes (Signed)
Patient Care Team: Abner Greenspan, MD as PCP - General  DIAGNOSIS: family history of breast cancer on Evista for breast cancer risk reduction Current treatment: Evista 60 mg daily since 2012 CHIEF COMPLIANT: follow-up on Evista  INTERVAL HISTORY: Denise Ali is a 64 year old with above-mentioned history of high risk breast cancer and is here for annual follow-up. She has been on Evista intermittently for the past for years. She reports that she is tolerating it is still very well and she takes approximately 4 days every week. She tends to forget quite often. Denies any hot flashes or myalgias. The plan is to continue to treat her for a 5 years.   REVIEW OF SYSTEMS:   Constitutional: Denies fevers, chills or abnormal weight loss Eyes: Denies blurriness of vision Ears, nose, mouth, throat, and face: Denies mucositis or sore throat Respiratory: Denies cough, dyspnea or wheezes Cardiovascular: Denies palpitation, chest discomfort or lower extremity swelling Gastrointestinal:  Denies nausea, heartburn or change in bowel habits Skin: Denies abnormal skin rashes Lymphatics: Denies new lymphadenopathy or easy bruising Neurological:Denies numbness, tingling or new weaknesses Behavioral/Psych: Mood is stable, no new changes  Breast:  denies any pain or lumps or nodules in either breasts All other systems were reviewed with the patient and are negative.  I have reviewed the past medical history, past surgical history, social history and family history with the patient and they are unchanged from previous note.  ALLERGIES:  is allergic to alendronate sodium.  MEDICATIONS:  Current Outpatient Prescriptions  Medication Sig Dispense Refill  . acetaminophen (TYLENOL) 650 MG CR tablet Take 650 mg by mouth as needed.    . Calcium 1200-1000 MG-UNIT CHEW Chew 1 tablet by mouth daily.      . calcium carbonate (TUMS - DOSED IN MG ELEMENTAL CALCIUM) 500 MG chewable tablet Chew 2 tablets by mouth as  needed.     . cholecalciferol (VITAMIN D) 1000 UNITS tablet Take 2,000 Units by mouth daily.     . naproxen sodium (ANAPROX) 220 MG tablet Take 220 mg by mouth daily as needed.     . raloxifene (EVISTA) 60 MG tablet TAKE 1 TABLET (60 MG TOTAL) BY MOUTH DAILY. 90 tablet 3   No current facility-administered medications for this visit.    PHYSICAL EXAMINATION: ECOG PERFORMANCE STATUS: 0 - Asymptomatic  Filed Vitals:   11/18/14 1450  BP: 147/62  Pulse: 62  Temp: 98 F (36.7 C)  Resp: 18   Filed Weights   11/18/14 1450  Weight: 206 lb 2 oz (93.498 kg)    GENERAL:alert, no distress and comfortable SKIN: skin color, texture, turgor are normal, no rashes or significant lesions EYES: normal, Conjunctiva are pink and non-injected, sclera clear OROPHARYNX:no exudate, no erythema and lips, buccal mucosa, and tongue normal  NECK: supple, thyroid normal size, non-tender, without nodularity LYMPH:  no palpable lymphadenopathy in the cervical, axillary or inguinal LUNGS: clear to auscultation and percussion with normal breathing effort HEART: regular rate & rhythm and no murmurs and no lower extremity edema ABDOMEN:abdomen soft, non-tender and normal bowel sounds Musculoskeletal:no cyanosis of digits and no clubbing  NEURO: alert & oriented x 3 with fluent speech, no focal motor/sensory deficits BREAST: No palpable masses or nodules in either right or left breasts. No palpable axillary supraclavicular or infraclavicular adenopathy no breast tenderness or nipple discharge. (exam performed in the presence of a chaperone)  LABORATORY DATA:  I have reviewed the data as listed   Chemistry  Component Value Date/Time   NA 138 09/28/2014 0758   NA 140 02/16/2013 0755   NA 140 04/13/2010 1404   K 4.6 09/28/2014 0758   K 4.0 02/16/2013 0755   K 4.6 04/13/2010 1404   CL 105 09/28/2014 0758   CL 107 10/17/2012 1050   CL 99 04/13/2010 1404   CO2 29 09/28/2014 0758   CO2 24 02/16/2013 0755    CO2 29 04/13/2010 1404   BUN 19 09/28/2014 0758   BUN 14.4 02/16/2013 0755   BUN 15 04/13/2010 1404   CREATININE 0.70 09/28/2014 0758   CREATININE 0.8 02/16/2013 0755   CREATININE 0.6 04/13/2010 1404      Component Value Date/Time   CALCIUM 9.4 09/28/2014 0758   CALCIUM 9.2 02/16/2013 0755   CALCIUM 9.3 04/13/2010 1404   ALKPHOS 63 09/28/2014 0758   ALKPHOS 65 02/16/2013 0755   ALKPHOS 59 04/13/2010 1404   AST 16 09/28/2014 0758   AST 15 02/16/2013 0755   AST 19 04/13/2010 1404   ALT 15 09/28/2014 0758   ALT 7 02/16/2013 0755   ALT 13 04/13/2010 1404   BILITOT 0.5 09/28/2014 0758   BILITOT 0.58 02/16/2013 0755   BILITOT 0.80 04/13/2010 1404       Lab Results  Component Value Date   WBC 6.0 09/28/2014   HGB 12.7 09/28/2014   HCT 36.8 09/28/2014   MCV 89.2 09/28/2014   PLT 228.0 09/28/2014   NEUTROABS 3.6 09/28/2014    ASSESSMENT & PLAN:  Breast cancer screening, high risk patient Family history of breast cancer on Evista for breast cancer risk reduction (somewhat noncompliant with taking the medication). Based on previous reports, her lifetime risk of breast cancer the 29%.  Surveillance plan: 1. Annual mammograms: I reviewed the mammogram from August 2015 which was normal with the breast density category B. Her last breast MRI was in 2013. 2. Survivorship:Discussed the importance of physical exercise in decreasing the likelihood of breast cancer recurrence. Recommended 30 mins daily 6 days a week of either brisk walking or cycling or swimming. Encouraged patient to eat more fruits and vegetables and decrease red meat.  3. Continue with vitamin D supplementation 4. Breast exam 11/10/2014 is normal  Return to clinic on an annual basis   No orders of the defined types were placed in this encounter.   The patient has a good understanding of the overall plan. she agrees with it. She will call with any problems that may develop before her next visit here.   Rulon Eisenmenger, MD

## 2014-11-18 NOTE — Assessment & Plan Note (Signed)
Family history of breast cancer on Evista for breast cancer risk reduction (somewhat noncompliant with taking the medication). Based on previous reports, her lifetime risk of breast cancer the 29%.  Surveillance plan: 1. Annual mammograms: I reviewed the mammogram from August 2015 which was normal with the breast density category B. Her last breast MRI was in 2013. 2. Survivorship:Discussed the importance of physical exercise in decreasing the likelihood of breast cancer recurrence. Recommended 30 mins daily 6 days a week of either brisk walking or cycling or swimming. Encouraged patient to eat more fruits and vegetables and decrease red meat.  3. Continue with vitamin D supplementation 4. Breast exam 11/10/2014 is normal  Return to clinic on an annual basis

## 2014-11-19 ENCOUNTER — Telehealth: Payer: Self-pay | Admitting: Hematology and Oncology

## 2014-11-19 NOTE — Telephone Encounter (Signed)
lvm for pt regarding to April 2017 appt....mailed pt appt sched/avs and letter

## 2014-11-24 ENCOUNTER — Encounter: Payer: Self-pay | Admitting: Family Medicine

## 2014-11-24 ENCOUNTER — Ambulatory Visit (INDEPENDENT_AMBULATORY_CARE_PROVIDER_SITE_OTHER): Payer: 59 | Admitting: Family Medicine

## 2014-11-24 VITALS — BP 150/81 | HR 57 | Temp 98.5°F | Ht 64.5 in | Wt 204.5 lb

## 2014-11-24 DIAGNOSIS — I10 Essential (primary) hypertension: Secondary | ICD-10-CM | POA: Diagnosis not present

## 2014-11-24 MED ORDER — HYDROCHLOROTHIAZIDE 25 MG PO TABS: 25.0000 mg | ORAL_TABLET | Freq: Every day | ORAL | Status: DC

## 2014-11-24 NOTE — Patient Instructions (Signed)
If you want to get a blood pressure meter- get [employer] brand for the arm  Always check when relaxed / not after exercise /not when anxious  Keep working on healthy diet and weight loss and drink more water  Start HCTZ 25 mg one daily in the am  If any problems/side effects please let me know   Follow up in 2-3 weeks for a visit - and will likely check labs that day also

## 2014-11-24 NOTE — Progress Notes (Signed)
Subjective:    Patient ID: Denise Ali, female    DOB: 29-Mar-1951, 64 y.o.   MRN: [id number]  HPI Here for f/u of elevated bp   Feeling fine   BP Readings from Last 3 Encounters:  11/24/14 150/81  11/18/14 147/62  10/05/14 145/90    Wt is down 2 lb  Obese  Has been very cautious about sodium, not adding salt and cut back on processed food a lot  Doing more regular walking  Needs to drink more water - does tend to be a little swollen in ankles and fingers   No HA or dizziness or CP  Has not checked bp outside of the office   Patient Active Problem List   Diagnosis Date Noted  . Elevated blood pressure 10/05/2014  . Breast cancer screening, high risk patient 10/18/2011  . Routine general medical examination at a health care facility 07/02/2011  . Encounter for routine gynecological examination 07/02/2011  . Irregular heart beat 04/09/2011  . HYPOKALEMIA 04/22/2009  . Vitamin D deficiency 02/24/2009  . STRESS REACTION, ACUTE, WITH EMOTIONAL DISTURBANCE 11/25/2007  . Osteopenia 01/14/2007  . EDEMA 01/14/2007   Past Medical History  Diagnosis Date  . Edema   . 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 Family history of malignant neoplasm of breast     evista for BRCA ppx  . Hypopotassemia   . Neoplasm of uncertain behavior of skin   . Disorder of bone and cartilage, unspecified   . Other screening mammogram   . Routine gynecological examination   . Predominant disturbance of emotions   . Unspecified vitamin D deficiency   . Colon polyp 12/2007  . Fibrocystic breast   . Bradycardia 9/12    mild - at one time with PACs/ asymptomatic   . Breast cancer screening, high risk patient 10/18/2011   Past Surgical History  Procedure Laterality Date  . Tonsillectomy    . Leep      HPV  . Dexa  12/2003    osteopenia  . Dexa  11/07    stable  . Dexa  1/10    Osteopenia-slightly improved  . Breast biopsy  01/2010  .  Breast mri  01/2010    Normal  . Breast biopsy  8/11    Fibrocystic change  . Cataracts  9/12    bilateral  . Procedure for "bad pap"     History  Substance Use Topics  . Smoking status: Former Smoker    Quit date: 07/23/1985  . Smokeless tobacco: Never Used     Comment: Quit "years ago"  . Alcohol Use: Yes     Comment: Occasional   Family History  Problem Relation Age of Onset  . Diabetes Father   . Skin cancer Father     ?  Marland Kitchen Breast cancer Mother   . Osteoporosis Mother   . Osteopenia Sister   . Breast cancer Sister   . Breast cancer      cousin/Grandmother  . Heart attack Paternal Grandfather   . Colon cancer Neg Hx    Allergies  Allergen Reactions  . Alendronate Sodium Other (See Comments)    REACTION: leg and joint pain   Current Outpatient Prescriptions on File Prior to Visit  Medication Sig Dispense Refill  . acetaminophen (TYLENOL) 650 MG CR tablet Take 650 mg by mouth as needed.    . Calcium 1200-1000 MG-UNIT CHEW Chew 1 tablet by mouth daily.      Marland Kitchen  calcium carbonate (TUMS - DOSED IN MG ELEMENTAL CALCIUM) 500 MG chewable tablet Chew 2 tablets by mouth as needed.     . cholecalciferol (VITAMIN D) 1000 UNITS tablet Take 2,000 Units by mouth daily.     . naproxen sodium (ANAPROX) 220 MG tablet Take 220 mg by mouth daily as needed.     . raloxifene (EVISTA) 60 MG tablet TAKE 1 TABLET (60 MG TOTAL) BY MOUTH DAILY. 90 tablet 3   No current facility-administered medications on file prior to visit.      Review of Systems Review of Systems  Constitutional: Negative for fever, appetite change, fatigue and unexpected weight change.  Eyes: Negative for pain and visual disturbance.  Respiratory: Negative for cough and shortness of breath.   Cardiovascular: Negative for cp or palpitations    Gastrointestinal: Negative for nausea, diarrhea and constipation.  Genitourinary: Negative for urgency and frequency.  Skin: Negative for pallor or rash   Neurological:  Negative for weakness, light-headedness, numbness and headaches.  Hematological: Negative for adenopathy. Does not bruise/bleed easily.  Psychiatric/Behavioral: Negative for dysphoric mood. The patient is not nervous/anxious.         Objective:   Physical Exam  Constitutional: She appears well-developed and well-nourished. No distress.  obese and well appearing   HENT:  Head: Normocephalic and atraumatic.  Mouth/Throat: Oropharynx is clear and moist.  Eyes: Conjunctivae and EOM are normal. Pupils are equal, round, and reactive to light.  Neck: Normal range of motion. Neck supple. No JVD present. Carotid bruit is not present. No thyromegaly present.  Cardiovascular: Normal rate, regular rhythm, normal heart sounds and intact distal pulses.  Exam reveals no gallop.   Pulmonary/Chest: Effort normal and breath sounds normal. No respiratory distress. She has no wheezes. She has no rales.  No crackles  Abdominal: Soft. Bowel sounds are normal. She exhibits no distension, no abdominal bruit and no mass. There is no tenderness.  Musculoskeletal: She exhibits no edema.  Lymphadenopathy:    She has no cervical adenopathy.  Neurological: She is alert. She has normal reflexes.  Skin: Skin is warm and dry. No rash noted.  Psychiatric: She has a normal mood and affect.          Assessment & Plan:   Problem List Items Addressed This Visit      Cardiovascular and Mediastinum   Hypertension - Primary    New dx of essential HTN  Doing well with lifestyle change -enc further wt loss and exercise  Start hctz 25 mg daily- pot side eff disc with pt/will update  F/u in 2-3 wk for visit and f/u labs Handout on checking bp given -she may get her own cuff       Relevant Medications   hydrochlorothiazide (HYDRODIURIL) 25 MG tablet

## 2014-11-24 NOTE — Progress Notes (Signed)
Pre visit review using our clinic review tool, if applicable. No additional management support is needed unless otherwise documented below in the visit note. 

## 2014-11-24 NOTE — Assessment & Plan Note (Signed)
New dx of essential HTN  Doing well with lifestyle change -enc further wt loss and exercise  Start hctz 25 mg daily- pot side eff disc with pt/will update  F/u in 2-3 wk for visit and f/u labs Handout on checking bp given -she may get her own cuff

## 2014-12-10 ENCOUNTER — Ambulatory Visit: Payer: 59 | Admitting: Family Medicine

## 2014-12-24 ENCOUNTER — Encounter: Payer: Self-pay | Admitting: Family Medicine

## 2014-12-24 ENCOUNTER — Other Ambulatory Visit: Payer: Self-pay | Admitting: Family Medicine

## 2014-12-24 ENCOUNTER — Ambulatory Visit (INDEPENDENT_AMBULATORY_CARE_PROVIDER_SITE_OTHER): Payer: 59 | Admitting: Family Medicine

## 2014-12-24 VITALS — BP 130/80 | HR 66 | Temp 98.7°F | Ht 64.5 in | Wt 204.5 lb

## 2014-12-24 DIAGNOSIS — E669 Obesity, unspecified: Secondary | ICD-10-CM | POA: Insufficient documentation

## 2014-12-24 DIAGNOSIS — I10 Essential (primary) hypertension: Secondary | ICD-10-CM | POA: Diagnosis not present

## 2014-12-24 LAB — BASIC METABOLIC PANEL
BUN: 14 mg/dL (ref 6–23)
CHLORIDE: 103 meq/L (ref 96–112)
CO2: 31 mEq/L (ref 19–32)
CREATININE: 0.74 mg/dL (ref 0.40–1.20)
Calcium: 9.6 mg/dL (ref 8.4–10.5)
GFR: 84.13 mL/min (ref >60.00)
Glucose, Bld: 106 mg/dL — ABNORMAL HIGH (ref 70–99)
POTASSIUM: 4.3 meq/L (ref 3.5–5.1)
SODIUM: 137 meq/L (ref 135–145)

## 2014-12-24 NOTE — Progress Notes (Signed)
Pre visit review using our clinic review tool, if applicable. No additional management support is needed unless otherwise documented below in the visit note. 

## 2014-12-24 NOTE — Assessment & Plan Note (Signed)
Improved with hctz and no problems BP: 130/80 mmHg  Disc lifestyle change and enc wt loss Lab today  F/u 6 mo

## 2014-12-24 NOTE — Progress Notes (Signed)
Subjective:    Patient ID: Denise Ali, female    DOB: [birth year], 64 y.o.   MRN: [id number]  HPI Here for f/u of HTN   Last visit added hctz 25 mg daily No side effects at all  Does urinate more after taking   Did not get a bp cuff for home   Lifestyle change - is avoiding salt and processed foods  Exercise -not at goal (bad schedule)  Still not eating enough vegetables   Wt is stable with bmi of 34  Obese range Wants to try to loose  Has to get in right frame of mind  Would like to get back to weight watchers   BP Readings from Last 3 Encounters:  12/24/14 142/88  11/24/14 150/81  11/18/14 147/62   on re check BP: 130/80 mmHg    Patient Active Problem List   Diagnosis Date Noted  . Obesity 12/24/2014  . Hypertension 10/05/2014  . Breast cancer screening, high risk patient 10/18/2011  . Routine general medical examination at a health care facility 07/02/2011  . Encounter for routine gynecological examination 07/02/2011  . Irregular heart beat 04/09/2011  . HYPOKALEMIA 04/22/2009  . Vitamin D deficiency 02/24/2009  . STRESS REACTION, ACUTE, WITH EMOTIONAL DISTURBANCE 11/25/2007  . Osteopenia 01/14/2007  . EDEMA 01/14/2007   Past Medical History  Diagnosis Date  . Edema   . 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 Family history of malignant neoplasm of breast     evista for BRCA ppx  . Hypopotassemia   . Neoplasm of uncertain behavior of skin   . Disorder of bone and cartilage, unspecified   . Other screening mammogram   . Routine gynecological examination   . Predominant disturbance of emotions   . Unspecified vitamin D deficiency   . Colon polyp 12/2007  . Fibrocystic breast   . Bradycardia 9/12    mild - at one time with PACs/ asymptomatic   . Breast cancer screening, high risk patient 10/18/2011   Past Surgical History  Procedure Laterality Date  . Tonsillectomy    . Leep      HPV  . Dexa   12/2003    osteopenia  . Dexa  11/07    stable  . Dexa  1/10    Osteopenia-slightly improved  . Breast biopsy  01/2010  . Breast mri  01/2010    Normal  . Breast biopsy  8/11    Fibrocystic change  . Cataracts  9/12    bilateral  . Procedure for "bad pap"     History  Substance Use Topics  . Smoking status: Former Smoker    Quit date: 07/23/1985  . Smokeless tobacco: Never Used     Comment: Quit "years ago"  . Alcohol Use: 0.0 oz/week    0 Standard drinks or equivalent per week     Comment: Occasional   Family History  Problem Relation Age of Onset  . Diabetes Father   . Skin cancer Father     ?  Marland Kitchen Breast cancer Mother   . Osteoporosis Mother   . Osteopenia Sister   . Breast cancer Sister   . Breast cancer      cousin/Grandmother  . Heart attack Paternal Grandfather   . Colon cancer Neg Hx    Allergies  Allergen Reactions  . Alendronate Sodium Other (See Comments)    REACTION: leg and joint pain   Current Outpatient Prescriptions on File  Prior to Visit  Medication Sig Dispense Refill  . acetaminophen (TYLENOL) 650 MG CR tablet Take 650 mg by mouth as needed.    . Calcium 1200-1000 MG-UNIT CHEW Chew 1 tablet by mouth daily.      . calcium carbonate (TUMS - DOSED IN MG ELEMENTAL CALCIUM) 500 MG chewable tablet Chew 2 tablets by mouth as needed.     . cholecalciferol (VITAMIN D) 1000 UNITS tablet Take 2,000 Units by mouth daily.     . hydrochlorothiazide (HYDRODIURIL) 25 MG tablet Take 1 tablet (25 mg total) by mouth daily. 30 tablet 3  . naproxen sodium (ANAPROX) 220 MG tablet Take 220 mg by mouth daily as needed.     . raloxifene (EVISTA) 60 MG tablet TAKE 1 TABLET (60 MG TOTAL) BY MOUTH DAILY. 90 tablet 3   No current facility-administered medications on file prior to visit.     Review of Systems Review of Systems  Constitutional: Negative for fever, appetite change, fatigue and unexpected weight change.  Eyes: Negative for pain and visual disturbance.    Respiratory: Negative for cough and shortness of breath.   Cardiovascular: Negative for cp or palpitations    Gastrointestinal: Negative for nausea, diarrhea and constipation.  Genitourinary: Negative for urgency and frequency.  Skin: Negative for pallor or rash   Neurological: Negative for weakness, light-headedness, numbness and headaches.  Hematological: Negative for adenopathy. Does not bruise/bleed easily.  Psychiatric/Behavioral: Negative for dysphoric mood. The patient is not nervous/anxious.         Objective:   Physical Exam  Constitutional: She appears well-developed and well-nourished. No distress.  obese and well appearing   HENT:  Head: Normocephalic and atraumatic.  Mouth/Throat: Oropharynx is clear and moist.  Eyes: Conjunctivae and EOM are normal. Pupils are equal, round, and reactive to light.  Neck: Normal range of motion. Neck supple. No JVD present. Carotid bruit is not present. No thyromegaly present.  Cardiovascular: Normal rate, regular rhythm, normal heart sounds and intact distal pulses.  Exam reveals no gallop.   Pulmonary/Chest: Effort normal and breath sounds normal. No respiratory distress. She has no wheezes. She has no rales.  No crackles  Abdominal: Soft. Bowel sounds are normal. She exhibits no distension, no abdominal bruit and no mass. There is no tenderness.  Musculoskeletal: She exhibits no edema.  Lymphadenopathy:    She has no cervical adenopathy.  Neurological: She is alert. She has normal reflexes.  Skin: Skin is warm and dry. No rash noted.  Psychiatric: She has a normal mood and affect.          Assessment & Plan:   Problem List Items Addressed This Visit    Hypertension - Primary    Improved with hctz and no problems BP: 130/80 mmHg  Disc lifestyle change and enc wt loss Lab today  F/u 6 mo       Relevant Orders   Basic metabolic panel (Completed)   Obesity    Discussed how this problem influences overall health and the  risks it imposes  Reviewed plan for weight loss with lower calorie diet (via better food choices and also portion control or program like weight watchers) and exercise building up to or more than 30 minutes 5 days per week including some aerobic activity    Recommended wt watchers program  Also the myfitnesspal app

## 2014-12-24 NOTE — Assessment & Plan Note (Signed)
Discussed how this problem influences overall health and the risks it imposes  Reviewed plan for weight loss with lower calorie diet (via better food choices and also portion control or program like weight watchers) and exercise building up to or more than 30 minutes 5 days per week including some aerobic activity    Recommended wt watchers program  Also the myfitnesspal app

## 2014-12-24 NOTE — Patient Instructions (Signed)
Stay on the HCTZ , it is helping  Work on healthy habits and weight loss  Look for an APP called "myfitnesspal"- helpful program  Follow up in about 6 months Labs today

## 2014-12-27 ENCOUNTER — Encounter: Payer: Self-pay | Admitting: *Deleted

## 2015-03-18 ENCOUNTER — Other Ambulatory Visit: Payer: Self-pay | Admitting: Family Medicine

## 2015-04-18 ENCOUNTER — Encounter: Payer: Self-pay | Admitting: Family Medicine

## 2015-04-29 ENCOUNTER — Encounter: Payer: Self-pay | Admitting: Family Medicine

## 2015-04-29 ENCOUNTER — Telehealth: Payer: Self-pay | Admitting: Family Medicine

## 2015-04-29 ENCOUNTER — Ambulatory Visit (INDEPENDENT_AMBULATORY_CARE_PROVIDER_SITE_OTHER): Payer: 59 | Admitting: Family Medicine

## 2015-04-29 VITALS — BP 128/66 | HR 69 | Temp 98.1°F | Wt 203.8 lb

## 2015-04-29 DIAGNOSIS — T8090XA Unspecified complication following infusion and therapeutic injection, initial encounter: Secondary | ICD-10-CM | POA: Diagnosis not present

## 2015-04-29 NOTE — Progress Notes (Signed)
Pre visit review using our clinic review tool, if applicable. No additional management support is needed unless otherwise documented below in the visit note.  Had flu and shingles shot 2 days ago, both on L arm.  Flu at deltoid, shingles on L triceps area.  Since then she felt fine initially but then had some chills and then noted her L arm felt warm and tender at the injection site of the shingles shot.  HA now, mild.  No fevers.  Still with good ROM at L shoulder.    Meds, vitals, and allergies reviewed.   ROS: See HPI.  Otherwise, noncontributory.  nad ncat Mmm Neck supple, no LA rrr ctab L triceps area with 8x8 cm of warmth and pinkish skin change, but not fluctuant mass

## 2015-04-29 NOTE — Telephone Encounter (Signed)
Pt has appt 04/29/15 at 3 pm with Dr Damita Dunnings.

## 2015-04-29 NOTE — Patient Instructions (Signed)
Likely an injection site reaction.  Should resolve.  If you have any blisters, then cover them with a soft bandage.  Take care.  Glad to see you.

## 2015-04-29 NOTE — Telephone Encounter (Signed)
Denise Ali N DOB: Aug 16, 1950 Initial Comment Caller states, Wed she has a health fair at work, She took the flu and shingles shots. Then last night, at the site of the shingles injection, it redeveloped a hard , hot, painful and raised spot. Nurse Assessment Nurse: Vallery Sa, RN, Cathy Date/Time (Eastern Time): 04/29/2015 12:40:55 PM Confirm and document reason for call. If symptomatic, describe symptoms. ---Caller states she had Shingles immunization 2 days ago and developed redness, swelling and a raised spot at the immunization today. No fever. Has the patient traveled out of the country within the last 30 days? ---No Does the patient have any new or worsening symptoms? ---Yes Will a triage be completed? ---Yes Related visit to physician within the last 2 weeks? ---Yes Does the PT have any chronic conditions? (i.e. diabetes, asthma, etc.) ---Yes List chronic conditions. ---High Blood Pressure Guidelines Guideline Title Affirmed Question Affirmed Notes Immunization Reactions [1] Redness or red streak around the injection site AND [2] begins > 48 hours after shot AND [3] no fever (Exception: red area < 1 inch or 2.5 cm wide) Final Disposition User See Physician within [place], RN, Tye [place] Comments Scheduled for 3pm appointment with Dr. Damita Dunnings today. Referrals REFERRED TO PCP OFFICE Disagree/Comply: Comply

## 2015-05-02 ENCOUNTER — Encounter: Payer: Self-pay | Admitting: Family Medicine

## 2015-05-02 DIAGNOSIS — T8090XA Unspecified complication following infusion and therapeutic injection, initial encounter: Secondary | ICD-10-CM | POA: Insufficient documentation

## 2015-05-02 NOTE — Assessment & Plan Note (Signed)
Likely local reaction, not a true infection or allergy.  No need for I&D, no sign of abscess.  Should resolve.  D/w pt.   Routine advice given.  Can ice the area prn.  Border marked, update [place] as needed. She agrees.

## 2015-05-03 ENCOUNTER — Encounter: Payer: Self-pay | Admitting: Family Medicine

## 2015-07-07 ENCOUNTER — Other Ambulatory Visit: Payer: Self-pay | Admitting: Family Medicine

## 2015-09-01 ENCOUNTER — Other Ambulatory Visit: Payer: Self-pay | Admitting: Family Medicine

## 2015-09-30 ENCOUNTER — Other Ambulatory Visit: Payer: Self-pay | Admitting: Family Medicine

## 2015-10-17 ENCOUNTER — Ambulatory Visit (INDEPENDENT_AMBULATORY_CARE_PROVIDER_SITE_OTHER): Payer: 59 | Admitting: Family Medicine

## 2015-10-17 ENCOUNTER — Encounter: Payer: Self-pay | Admitting: Family Medicine

## 2015-10-17 VITALS — BP 130/72 | HR 80 | Temp 99.0°F | Ht 64.5 in | Wt 204.8 lb

## 2015-10-17 DIAGNOSIS — J111 Influenza due to unidentified influenza virus with other respiratory manifestations: Secondary | ICD-10-CM | POA: Diagnosis not present

## 2015-10-17 DIAGNOSIS — R05 Cough: Secondary | ICD-10-CM

## 2015-10-17 DIAGNOSIS — R059 Cough, unspecified: Secondary | ICD-10-CM

## 2015-10-17 LAB — POC INFLUENZA A&B (BINAX/QUICKVUE)
INFLUENZA A, POC: NEGATIVE
Influenza B, POC: POSITIVE — AB

## 2015-10-17 MED ORDER — GUAIFENESIN-CODEINE 100-10 MG/5ML PO SYRP: 5.0000 mL | ORAL_SOLUTION | Freq: Four times a day (QID) | ORAL | Status: DC | PRN

## 2015-10-17 NOTE — Progress Notes (Signed)
Pre visit review using our clinic review tool, if applicable. No additional management support is needed unless otherwise documented below in the visit note. 

## 2015-10-17 NOTE — Patient Instructions (Signed)
Get rest  Drink fluids  Zyrtec or claritin  over the counter may help the runny nose and post nasal drip  Try the robitussin with codeine for cough - watch out for sedation   Update if not starting to improve in a week or if worsening

## 2015-10-17 NOTE — Progress Notes (Signed)
Subjective:    Patient ID: Denise Ali, female    DOB: 08/25/1950, 65 y.o.   MRN: [id number]  HPI Here with uri symptoms - ? Flu   Started the middle of last week- scratchy throat  Friday started fever - chills and body aches  Took tylenol  Congestion and cough  Dry cough- rattly but not bringing anything up  Clear nasal discharge  Dull headache -not a lot of sinus pain   Exp to the flu - multiple family members   Had a flu shot in the fall  Results for orders placed or performed in visit on 10/17/15  POC Influenza A&B(BINAX/QUICKVUE)  Result Value Ref Range   Influenza A, POC Negative Negative   Influenza B, POC Positive (A) Negative      Patient Active Problem List   Diagnosis Date Noted  . Influenza with respiratory manifestation 10/17/2015  . Injection site reaction 05/02/2015  . Obesity 12/24/2014  . Hypertension 10/05/2014  . Breast cancer screening, high risk patient 10/18/2011  . Routine general medical examination at a health care facility 07/02/2011  . Encounter for routine gynecological examination 07/02/2011  . Irregular heart beat 04/09/2011  . HYPOKALEMIA 04/22/2009  . Vitamin D deficiency 02/24/2009  . STRESS REACTION, ACUTE, WITH EMOTIONAL DISTURBANCE 11/25/2007  . Osteopenia 01/14/2007  . EDEMA 01/14/2007   Past Medical History  Diagnosis Date  . Edema   . 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 Family history of malignant neoplasm of breast     evista for BRCA ppx  . Hypopotassemia   . Neoplasm of uncertain behavior of skin   . Disorder of bone and cartilage, unspecified   . Other screening mammogram   . Routine gynecological examination   . Predominant disturbance of emotions   . Unspecified vitamin D deficiency   . Colon polyp 12/2007  . Fibrocystic breast   . Bradycardia 9/12    mild - at one time with PACs/ asymptomatic   . Breast cancer screening, high risk patient 10/18/2011    Past Surgical History  Procedure Laterality Date  . Tonsillectomy    . Leep      HPV  . Dexa  12/2003    osteopenia  . Dexa  11/07    stable  . Dexa  1/10    Osteopenia-slightly improved  . Breast biopsy  01/2010  . Breast mri  01/2010    Normal  . Breast biopsy  8/11    Fibrocystic change  . Cataracts  9/12    bilateral  . Procedure for "bad pap"     Social History  Substance Use Topics  . Smoking status: Former Smoker    Quit date: 07/23/1985  . Smokeless tobacco: Never Used     Comment: Quit "years ago"  . Alcohol Use: 0.0 oz/week    0 Standard drinks or equivalent per week     Comment: Occasional   Family History  Problem Relation Age of Onset  . Diabetes Father   . Skin cancer Father     ?  Marland Kitchen Breast cancer Mother   . Osteoporosis Mother   . Osteopenia Sister   . Breast cancer Sister   . Breast cancer      cousin/Grandmother  . Heart attack Paternal Grandfather   . Colon cancer Neg Hx    Allergies  Allergen Reactions  . Alendronate Sodium Other (See Comments)    REACTION: leg and joint pain  Current Outpatient Prescriptions on File Prior to Visit  Medication Sig Dispense Refill  . acetaminophen (TYLENOL) 650 MG CR tablet Take 650 mg by mouth as needed.    . Calcium 1200-1000 MG-UNIT CHEW Chew 1 tablet by mouth daily.      . calcium carbonate (TUMS - DOSED IN MG ELEMENTAL CALCIUM) 500 MG chewable tablet Chew 2 tablets by mouth as needed.     . cholecalciferol (VITAMIN D) 1000 UNITS tablet Take 2,000 Units by mouth daily.     . hydrochlorothiazide (HYDRODIURIL) 25 MG tablet TAKE 1 TABLET BY MOUTH DAILY. NEED TO SCHEDULE APPOINTMENT WITH DR. Glori Bickers 30 tablet 0  . naproxen sodium (ANAPROX) 220 MG tablet Take 220 mg by mouth daily as needed.      No current facility-administered medications on file prior to visit.    Review of Systems  Constitutional: Positive for appetite change and fatigue. Negative for fever.  HENT: Positive for congestion, postnasal  drip, rhinorrhea, sinus pressure, sneezing and sore throat. Negative for ear pain.   Eyes: Negative for pain and discharge.  Respiratory: Positive for cough. Negative for shortness of breath, wheezing and stridor.   Cardiovascular: Negative for chest pain.  Gastrointestinal: Negative for nausea, vomiting and diarrhea.  Genitourinary: Negative for urgency, frequency and hematuria.  Musculoskeletal: Negative for myalgias and arthralgias.  Skin: Negative for rash.  Neurological: Positive for headaches. Negative for dizziness, weakness and light-headedness.  Psychiatric/Behavioral: Negative for confusion and dysphoric mood.           Objective:   Physical Exam  Constitutional: She appears well-developed and well-nourished. No distress.  Fatigued appearing   HENT:  Head: Normocephalic and atraumatic.  Right Ear: External ear normal.  Left Ear: External ear normal.  Mouth/Throat: Oropharynx is clear and moist.  Nares are injected and congested  No sinus tenderness Clear rhinorrhea and post nasal drip   Eyes: Conjunctivae and EOM are normal. Pupils are equal, round, and reactive to light. Right eye exhibits no discharge. Left eye exhibits no discharge.  Neck: Normal range of motion. Neck supple.  Cardiovascular: Normal rate and normal heart sounds.   Pulmonary/Chest: Effort normal and breath sounds normal. No respiratory distress. She has no wheezes. She has no rales. She exhibits no tenderness.  Cough sounds junky - upper airway sounds noted  No rales or rhonchi No wheeze  bs are generally harsh Good air exch  Lymphadenopathy:    She has no cervical adenopathy.  Neurological: She is alert.  Skin: Skin is warm and dry. No rash noted.  Psychiatric: She has a normal mood and affect.  Pleasant           Assessment & Plan:   Problem List Items Addressed This Visit      Other   Influenza with respiratory manifestation    Started last week- too late for Tamiflu  Disc  symptomatic care - see instructions on AVS  Will try Robitussin AC for cough with caution of sedation  Fluids/rest  Acetaminophen for fever  Work note- can return when fever is down and feeling better  Update if not starting to improve in a week or if worsening         Other Visit Diagnoses    Cough    -  Primary    Relevant Orders    POC Influenza A&B(BINAX/QUICKVUE) (Completed)

## 2015-10-17 NOTE — Assessment & Plan Note (Signed)
Started last week- too late for Tamiflu  Disc symptomatic care - see instructions on AVS  Will try Robitussin AC for cough with caution of sedation  Fluids/rest  Acetaminophen for fever  Work note- can return when fever is down and feeling better  Update if not starting to improve in a week or if worsening

## 2015-10-27 ENCOUNTER — Other Ambulatory Visit: Payer: Self-pay | Admitting: Family Medicine

## 2015-11-17 ENCOUNTER — Ambulatory Visit: Payer: 59 | Admitting: Hematology and Oncology

## 2015-11-17 ENCOUNTER — Telehealth: Payer: Self-pay | Admitting: Hematology and Oncology

## 2015-11-17 NOTE — Telephone Encounter (Signed)
Received return call from patient re rescheduling 4/27 f/u. Per patient she can come in the AM on 5/9 anytime before 12 noon and in the PM on 5/16 anytime after 1 pm. Rescheduled f/u to 5/9 @ 9:30 am and left message for patient at the number she provided as home number [phone]. Patient was aware that I would call her back at this number to confirm a new date/time.

## 2015-11-29 ENCOUNTER — Ambulatory Visit (HOSPITAL_BASED_OUTPATIENT_CLINIC_OR_DEPARTMENT_OTHER): Payer: 59 | Admitting: Hematology and Oncology

## 2015-11-29 ENCOUNTER — Telehealth: Payer: Self-pay | Admitting: Hematology and Oncology

## 2015-11-29 ENCOUNTER — Encounter: Payer: Self-pay | Admitting: Hematology and Oncology

## 2015-11-29 VITALS — BP 159/75 | HR 56 | Temp 97.7°F | Resp 18 | Ht 64.5 in | Wt 203.8 lb

## 2015-11-29 DIAGNOSIS — Z1501 Genetic susceptibility to malignant neoplasm of breast: Secondary | ICD-10-CM

## 2015-11-29 DIAGNOSIS — Z1239 Encounter for other screening for malignant neoplasm of breast: Secondary | ICD-10-CM

## 2015-11-29 NOTE — Assessment & Plan Note (Signed)
Family history of breast cancer on Evista for breast cancer risk reduction (somewhat noncompliant with taking the medication). Based on previous reports, her lifetime risk of breast cancer the 29%.  Surveillance plan: 1. Annual mammograms: I reviewed the mammogram from 04/20/2015 which was normal with the breast density category B. ultrasound of the breast revealed simple cyst in the right breast on 04/12/2015 2. Survivorship:Discussed the importance of physical exercise in decreasing the likelihood of breast cancer recurrence. Recommended 30 mins daily 6 days a week of either brisk walking or cycling or swimming. Encouraged patient to eat more fruits and vegetables and decrease red meat.  3. Continue with vitamin D supplementation 4. Breast exam 11/29/2015 is normal  Follow-up with survivorship clinic annually

## 2015-11-29 NOTE — Progress Notes (Signed)
Patient Care Team: Abner Greenspan, MD as PCP - General  DIAGNOSIS: surveillance for high risk breast cancer  CHIEF COMPLIANT: surveillance for breast cancer  INTERVAL HISTORY: Denise Ali is a 65 year old with above-mentioned history of high risk for breast cancer who is here for annual visit. She reports no new health issues. Denies any lumps or nodules in the breasts. She stopped taking Evista last year.  REVIEW OF SYSTEMS:   Constitutional: Denies fevers, chills or abnormal weight loss Eyes: Denies blurriness of vision Ears, nose, mouth, throat, and face: Denies mucositis or sore throat Respiratory: Denies cough, dyspnea or wheezes Cardiovascular: Denies palpitation, chest discomfort Gastrointestinal:  Denies nausea, heartburn or change in bowel habits Skin: Denies abnormal skin rashes Lymphatics: Denies new lymphadenopathy or easy bruising Neurological:Denies numbness, tingling or new weaknesses Behavioral/Psych: Mood is stable, no new changes  Extremities: No lower extremity edema Breast:  denies any pain or lumps or nodules in either breasts All other systems were reviewed with the patient and are negative.  I have reviewed the past medical history, past surgical history, social history and family history with the patient and they are unchanged from previous note.  ALLERGIES:  is allergic to alendronate sodium.  MEDICATIONS:  Current Outpatient Prescriptions  Medication Sig Dispense Refill  . acetaminophen (TYLENOL) 650 MG CR tablet Take 650 mg by mouth as needed.    . Calcium 1200-1000 MG-UNIT CHEW Chew 1 tablet by mouth daily.      . calcium carbonate (TUMS - DOSED IN MG ELEMENTAL CALCIUM) 500 MG chewable tablet Chew 2 tablets by mouth as needed.     . cholecalciferol (VITAMIN D) 1000 UNITS tablet Take 2,000 Units by mouth daily.     Marland Kitchen guaiFENesin-codeine (ROBITUSSIN AC) 100-10 MG/5ML syrup Take 5-10 mLs by mouth 4 (four) times daily as needed for cough (caution  of sedation, also take with food). 120 mL 0  . hydrochlorothiazide (HYDRODIURIL) 25 MG tablet Take 1 tablet (25 mg total) by mouth daily. 30 tablet 1  . naproxen sodium (ANAPROX) 220 MG tablet Take 220 mg by mouth daily as needed.      No current facility-administered medications for this visit.    PHYSICAL EXAMINATION: ECOG PERFORMANCE STATUS: 0 - Asymptomatic  Filed Vitals:   11/29/15 0936  BP: 159/75  Pulse: 56  Temp: 97.7 F (36.5 C)  Resp: 18   Filed Weights   11/29/15 0936  Weight: 203 lb 12.8 oz (92.443 kg)    GENERAL:alert, no distress and comfortable SKIN: skin color, texture, turgor are normal, no rashes or significant lesions EYES: normal, Conjunctiva are pink and non-injected, sclera clear OROPHARYNX:no exudate, no erythema and lips, buccal mucosa, and tongue normal  NECK: supple, thyroid normal size, non-tender, without nodularity LYMPH:  no palpable lymphadenopathy in the cervical, axillary or inguinal LUNGS: clear to auscultation and percussion with normal breathing effort HEART: regular rate & rhythm and no murmurs and no lower extremity edema ABDOMEN:abdomen soft, non-tender and normal bowel sounds MUSCULOSKELETAL:no cyanosis of digits and no clubbing  NEURO: alert & oriented x 3 with fluent speech, no focal motor/sensory deficits EXTREMITIES: No lower extremity edema BREAST: No palpable masses or nodules in either right or left breasts. No palpable axillary supraclavicular or infraclavicular adenopathy no breast tenderness or nipple discharge. (exam performed in the presence of a chaperone)  LABORATORY DATA:  I have reviewed the data as listed   Chemistry      Component Value Date/Time   NA 137 12/24/2014  0901   NA 140 02/16/2013 0755   NA 140 04/13/2010 1404   K 4.3 12/24/2014 0901   K 4.0 02/16/2013 0755   K 4.6 04/13/2010 1404   CL 103 12/24/2014 0901   CL 107 10/17/2012 1050   CL 99 04/13/2010 1404   CO2 31 12/24/2014 0901   CO2 24 02/16/2013  0755   CO2 29 04/13/2010 1404   BUN 14 12/24/2014 0901   BUN 14.4 02/16/2013 0755   BUN 15 04/13/2010 1404   CREATININE 0.74 12/24/2014 0901   CREATININE 0.8 02/16/2013 0755   CREATININE 0.6 04/13/2010 1404      Component Value Date/Time   CALCIUM 9.6 12/24/2014 0901   CALCIUM 9.2 02/16/2013 0755   CALCIUM 9.3 04/13/2010 1404   ALKPHOS 63 09/28/2014 0758   ALKPHOS 65 02/16/2013 0755   ALKPHOS 59 04/13/2010 1404   AST 16 09/28/2014 0758   AST 15 02/16/2013 0755   AST 19 04/13/2010 1404   ALT 15 09/28/2014 0758   ALT 7 02/16/2013 0755   ALT 13 04/13/2010 1404   BILITOT 0.5 09/28/2014 0758   BILITOT 0.58 02/16/2013 0755   BILITOT 0.80 04/13/2010 1404       Lab Results  Component Value Date   WBC 6.0 09/28/2014   HGB 12.7 09/28/2014   HCT 36.8 09/28/2014   MCV 89.2 09/28/2014   PLT 228.0 09/28/2014   NEUTROABS 3.6 09/28/2014     ASSESSMENT & PLAN:  Breast cancer screening, high risk patient Family history of breast cancer on Evista for breast cancer risk reduction (somewhat noncompliant with taking the medication). Based on previous reports, her lifetime risk of breast cancer the 29%.  Surveillance plan: 1. Annual mammograms: I reviewed the mammogram from 04/20/2015 which was normal with the breast density category B. ultrasound of the breast revealed simple cyst in the right breast on 04/12/2015 2. Survivorship:Discussed the importance of physical exercise in decreasing the likelihood of breast cancer recurrence. Recommended 30 mins daily 6 days a week of either brisk walking or cycling or swimming. Encouraged patient to eat more fruits and vegetables and decrease red meat.  3. Continue with vitamin D supplementation 4. Breast exam 11/29/2015 is normal  Follow-up with survivorship clinic annually   No orders of the defined types were placed in this encounter.   The patient has a good understanding of the overall plan. she agrees with it. she will call with any  problems that may develop before the next visit here.   Rulon Eisenmenger, MD 11/29/2015

## 2015-11-29 NOTE — Telephone Encounter (Signed)
appt made and avs printed °

## 2015-12-24 ENCOUNTER — Other Ambulatory Visit: Payer: Self-pay | Admitting: Family Medicine

## 2015-12-26 NOTE — Telephone Encounter (Signed)
Please schedule 30 min office visit this summer for f/u and refill until then

## 2015-12-26 NOTE — Telephone Encounter (Signed)
Pt has had a recent acute appt but no recent CPE or f/u, please advise  

## 2015-12-27 NOTE — Telephone Encounter (Signed)
appt scheduled and med refilled 

## 2016-03-07 ENCOUNTER — Encounter: Payer: Self-pay | Admitting: Family Medicine

## 2016-03-07 ENCOUNTER — Ambulatory Visit (INDEPENDENT_AMBULATORY_CARE_PROVIDER_SITE_OTHER): Payer: 59 | Admitting: Family Medicine

## 2016-03-07 VITALS — BP 126/78 | HR 64 | Temp 98.3°F | Ht 64.5 in | Wt 207.5 lb

## 2016-03-07 DIAGNOSIS — I10 Essential (primary) hypertension: Secondary | ICD-10-CM

## 2016-03-07 DIAGNOSIS — E669 Obesity, unspecified: Secondary | ICD-10-CM | POA: Diagnosis not present

## 2016-03-07 LAB — COMPREHENSIVE METABOLIC PANEL WITH GFR
ALT: 11 U/L (ref 0–35)
AST: 16 U/L (ref 0–37)
Albumin: 4.1 g/dL (ref 3.5–5.2)
Alkaline Phosphatase: 56 U/L (ref 39–117)
BUN: 17 mg/dL (ref 6–23)
CO2: 33 meq/L — ABNORMAL HIGH (ref 19–32)
Calcium: 10.7 mg/dL — ABNORMAL HIGH (ref 8.4–10.5)
Chloride: 99 meq/L (ref 96–112)
Creatinine, Ser: 0.74 mg/dL (ref 0.40–1.20)
GFR: 83.81 mL/min (ref >60.00)
Glucose, Bld: 85 mg/dL (ref 70–99)
Potassium: 3.6 meq/L (ref 3.5–5.1)
Sodium: 137 meq/L (ref 135–145)
Total Bilirubin: 0.5 mg/dL (ref 0.2–1.2)
Total Protein: 7 g/dL (ref 6.0–8.3)

## 2016-03-07 MED ORDER — HYDROCHLOROTHIAZIDE 25 MG PO TABS: ORAL_TABLET | ORAL | 11 refills | Status: DC

## 2016-03-07 NOTE — Progress Notes (Signed)
Subjective:    Patient ID: Denise Ali, female    DOB: 02/18/1951, 65 y.o.   MRN: [id number]  HPI Here for f/u of chronic health problems   Summer is going fast- very busy  Good and bad   Feeling pretty good   Wt Readings from Last 3 Encounters:  03/07/16 207 lb 8 oz (94.1 kg)  11/29/15 203 lb 12.8 oz (92.4 kg)  10/17/15 204 lb 12 oz (92.9 kg)  she lost some wt earlier in the year - hard to keep wt off  [facility] [employer] - [facility]  bmi is 35 Obese  bp is stable today  No cp or palpitations or headaches or edema  No side effects to medicines  BP Readings from Last 3 Encounters:  03/07/16 126/78  11/29/15 (!) 159/75  10/17/15 130/72    Due for labs  On hctz     Chemistry      Component Value Date/Time   NA 137 12/24/2014 0901   NA 140 02/16/2013 0755   K 4.3 12/24/2014 0901   K 4.0 02/16/2013 0755   CL 103 12/24/2014 0901   CL 107 10/17/2012 1050   CO2 31 12/24/2014 0901   CO2 24 02/16/2013 0755   BUN 14 12/24/2014 0901   BUN 14.4 02/16/2013 0755   CREATININE 0.74 12/24/2014 0901   CREATININE 0.8 02/16/2013 0755      Component Value Date/Time   CALCIUM 9.6 12/24/2014 0901   CALCIUM 9.2 02/16/2013 0755   ALKPHOS 63 09/28/2014 0758   ALKPHOS 65 02/16/2013 0755   AST 16 09/28/2014 0758   AST 15 02/16/2013 0755   ALT 15 09/28/2014 0758   ALT 7 02/16/2013 0755   BILITOT 0.5 09/28/2014 0758   BILITOT 0.58 02/16/2013 0755       Patient Active Problem List   Diagnosis Date Noted  . Obesity 12/24/2014  . Hypertension 10/05/2014  . Breast cancer screening, high risk patient 10/18/2011  . Routine general medical examination at a health care facility 07/02/2011  . Encounter for routine gynecological examination 07/02/2011  . Irregular heart beat 04/09/2011  . HYPOKALEMIA 04/22/2009  . Vitamin D deficiency 02/24/2009  . STRESS REACTION, ACUTE, WITH EMOTIONAL DISTURBANCE 11/25/2007  . Osteopenia 01/14/2007  .  EDEMA 01/14/2007   Past Medical History:  Diagnosis Date  . Bradycardia 9/12   mild - at one time with PACs/ asymptomatic   . Breast cancer screening, high risk patient 10/18/2011  . Colon polyp 12/2007  . Disorder of bone and cartilage, unspecified   . Edema   . Family history of malignant neoplasm of breast    evista for BRCA ppx  . Fibrocystic breast   . 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 Hypopotassemia   . Neoplasm of uncertain behavior of skin   . Other screening mammogram   . Predominant disturbance of emotions   . Routine gynecological examination   . Unspecified vitamin D deficiency    Past Surgical History:  Procedure Laterality Date  . BREAST BIOPSY  01/2010  . BREAST BIOPSY  8/11   Fibrocystic change  . Breast MRI  01/2010   Normal  . cataracts  9/12   bilateral  . DEXA  12/2003   osteopenia  . DEXA  11/07   stable  . DEXA  1/10   Osteopenia-slightly improved  . LEEP     HPV  .  procedure for "bad pap"    . TONSILLECTOMY     Social History  Substance Use Topics  . Smoking status: Former Smoker    Quit date: 07/23/1985  . Smokeless tobacco: Never Used     Comment: Quit "years ago"  . Alcohol use 0.0 oz/week     Comment: Occasional   Family History  Problem Relation Age of Onset  . Diabetes Father   . Skin cancer Father     ?  Marland Kitchen Breast cancer Mother   . Osteoporosis Mother   . Osteopenia Sister   . Breast cancer Sister   . Breast cancer      cousin/Grandmother  . Heart attack Paternal Grandfather   . Colon cancer Neg Hx    Allergies  Allergen Reactions  . Alendronate Sodium Other (See Comments)    REACTION: leg and joint pain   Current Outpatient Prescriptions on File Prior to Visit  Medication Sig Dispense Refill  . Calcium 1200-1000 MG-UNIT CHEW Chew 1 tablet by mouth daily.      . cholecalciferol (VITAMIN D) 1000 UNITS tablet Take 2,000 Units by mouth daily.      No current  facility-administered medications on file prior to visit.     Review of Systems    Review of Systems  Constitutional: Negative for fever, appetite change, fatigue and unexpected weight change.  Eyes: Negative for pain and visual disturbance.  Respiratory: Negative for cough and shortness of breath.   Cardiovascular: Negative for cp or palpitations    Gastrointestinal: Negative for nausea, diarrhea and constipation.  Genitourinary: Negative for urgency and frequency.  Skin: Negative for pallor or rash   Neurological: Negative for weakness, light-headedness, numbness and headaches.  Hematological: Negative for adenopathy. Does not bruise/bleed easily.  Psychiatric/Behavioral: Negative for dysphoric mood. The patient is not nervous/anxious.      Objective:   Physical Exam  Constitutional: She appears well-developed and well-nourished. No distress.  obese and well appearing   HENT:  Head: Normocephalic and atraumatic.  Mouth/Throat: Oropharynx is clear and moist.  Eyes: Conjunctivae and EOM are normal. Pupils are equal, round, and reactive to light.  Neck: Normal range of motion. Neck supple. No JVD present. Carotid bruit is not present. No thyromegaly present.  Cardiovascular: Normal rate, regular rhythm, normal heart sounds and intact distal pulses.  Exam reveals no gallop.   Pulmonary/Chest: Effort normal and breath sounds normal. No respiratory distress. She has no wheezes. She has no rales.  No crackles  Abdominal: Soft. Bowel sounds are normal. She exhibits no distension, no abdominal bruit and no mass. There is no tenderness.  Musculoskeletal: She exhibits no edema.  Lymphadenopathy:    She has no cervical adenopathy.  Neurological: She is alert. She has normal reflexes.  Skin: Skin is warm and dry. No rash noted.  Psychiatric: She has a normal mood and affect.          Assessment & Plan:   Problem List Items Addressed This Visit      Cardiovascular and Mediastinum    Hypertension - Primary    bp in fair control at this time  BP Readings from Last 1 Encounters:  03/07/16 126/78   No changes needed Disc lifstyle change with low sodium diet and exercise   Labs today Refilled hctz Enc DASH diet       Relevant Medications   hydrochlorothiazide (HYDRODIURIL) 25 MG tablet   Other Relevant Orders   Comprehensive metabolic panel (Completed)     Other  Obesity    Discussed how this problem influences overall health and the risks it imposes  Reviewed plan for weight loss with lower calorie diet (via better food choices and also portion control or program like weight watchers) and exercise building up to or more than 30 minutes 5 days per week including some aerobic activity          Other Visit Diagnoses   None.

## 2016-03-07 NOTE — Patient Instructions (Addendum)
Labs today  Take care of yourself  Eat a healthy diet  Keep exercising  Follow up in the spring for annual exam

## 2016-03-07 NOTE — Progress Notes (Signed)
Pre visit review using our clinic review tool, if applicable. No additional management support is needed unless otherwise documented below in the visit note. 

## 2016-03-08 NOTE — Assessment & Plan Note (Signed)
Discussed how this problem influences overall health and the risks it imposes  Reviewed plan for weight loss with lower calorie diet (via better food choices and also portion control or program like weight watchers) and exercise building up to or more than 30 minutes 5 days per week including some aerobic activity    

## 2016-03-08 NOTE — Assessment & Plan Note (Signed)
bp in fair control at this time  BP Readings from Last 1 Encounters:  03/07/16 126/78   No changes needed Disc lifstyle change with low sodium diet and exercise   Labs today Refilled hctz Enc DASH diet

## 2016-03-09 ENCOUNTER — Encounter: Payer: Self-pay | Admitting: *Deleted

## 2016-04-19 ENCOUNTER — Encounter: Payer: Self-pay | Admitting: Family Medicine

## 2016-05-21 ENCOUNTER — Encounter: Payer: Self-pay | Admitting: Family Medicine

## 2016-05-21 ENCOUNTER — Ambulatory Visit (INDEPENDENT_AMBULATORY_CARE_PROVIDER_SITE_OTHER): Payer: 59 | Admitting: Family Medicine

## 2016-05-21 VITALS — BP 140/82 | HR 64 | Temp 97.8°F | Wt 193.5 lb

## 2016-05-21 DIAGNOSIS — H1013 Acute atopic conjunctivitis, bilateral: Secondary | ICD-10-CM

## 2016-05-21 DIAGNOSIS — H109 Unspecified conjunctivitis: Secondary | ICD-10-CM | POA: Insufficient documentation

## 2016-05-21 MED ORDER — OLOPATADINE HCL 0.1 % OP SOLN: 1.0000 [drp] | Freq: Two times a day (BID) | OPHTHALMIC | 1 refills | Status: DC

## 2016-05-21 NOTE — Progress Notes (Signed)
   BP 140/82   Pulse 64   Temp 97.8 F (36.6 C) (Oral)   Wt 193 lb 8 oz (87.8 kg)   BMI 32.70 kg/m    CC: check left eye Subjective:    Patient ID: Denise Ali, female    DOB: 1951-05-27, 65 y.o.   MRN: [id number]  HPI: Denise Ali is a 65 y.o. female presenting on 05/21/2016 for Eye Problem (left eyelid itches & burns;also is red and feels dry and bumpy)   2 wks ago was outdoors for a prolonged time - eyes felt red and itchy for a short time one day that quickly cleared up. Over last 2 days noticing increased swelling, itching and burning at L eye. Feels bumps at superior eyelid. Swelling is better today. Does not wear contacts  This morning noticed bumps under right eye.   No vision changes, fevers/chills, watery eyes, drainage.  Hasn't tried anything for this yet.  No sick contacts at home.   Relevant past medical, surgical, family and social history reviewed and updated as indicated. Interim medical history since our last visit reviewed. Allergies and medications reviewed and updated. Current Outpatient Prescriptions on File Prior to Visit  Medication Sig  . Calcium 1200-1000 MG-UNIT CHEW Chew 1 tablet by mouth daily.    . cholecalciferol (VITAMIN D) 1000 UNITS tablet Take 2,000 Units by mouth daily.   . hydrochlorothiazide (HYDRODIURIL) 25 MG tablet TAKE 1 TABLET (25 MG TOTAL) BY MOUTH DAILY.   No current facility-administered medications on file prior to visit.     Review of Systems Per HPI unless specifically indicated in ROS section     Objective:    BP 140/82   Pulse 64   Temp 97.8 F (36.6 C) (Oral)   Wt 193 lb 8 oz (87.8 kg)   BMI 32.70 kg/m   Wt Readings from Last 3 Encounters:  05/21/16 193 lb 8 oz (87.8 kg)  03/07/16 207 lb 8 oz (94.1 kg)  11/29/15 203 lb 12.8 oz (92.4 kg)    Physical Exam  Constitutional: She appears well-developed and well-nourished. No distress.  HENT:  Head: Normocephalic and atraumatic.  Eyes: EOM are  normal. Pupils are equal, round, and reactive to light. Right eye exhibits no discharge and no hordeolum. No foreign body present in the right eye. Left eye exhibits no discharge and no hordeolum. No foreign body present in the left eye. Right conjunctiva is injected. Left conjunctiva is injected.  Mild bilateral bulbar conjunctival injection at limbus with relative palpebral sparing Mild edema L>R upper eyelid without obvious hordeolum No foreign body appreciated Mild peeling of L upper eyelid  Psychiatric: She has a normal mood and affect.  Nursing note and vitals reviewed.     Assessment & Plan:   Problem List Items Addressed This Visit    Conjunctivitis - Primary    Acute mild conjunctivitis with benign exam today. Not consistent with bacterial or viral cause - ?allergic/atopic given recent outdoor exposure. Treat with warm compresses and patanol antihistamine drops. Update if no better to consider ophtho referral. Ddx includes atopic dermatitis vs rosacea       Other Visit Diagnoses   None.      Follow up plan: Return if symptoms worsen or fail to improve.  Ria Bush, MD

## 2016-05-21 NOTE — Patient Instructions (Signed)
Start antihistamine eye drops sent to pharmacy.  Warm compresses to eyes.  May use benadryl or claritin for itch. Let us know if not better for eye doctor referral.

## 2016-05-21 NOTE — Assessment & Plan Note (Addendum)
Acute mild conjunctivitis with benign exam today. Not consistent with bacterial or viral cause - ?allergic/atopic given recent outdoor exposure. Treat with warm compresses and patanol antihistamine drops. Update if no better to consider ophtho referral. Ddx includes atopic dermatitis vs rosacea

## 2016-05-21 NOTE — Progress Notes (Signed)
Pre visit review using our clinic review tool, if applicable. No additional management support is needed unless otherwise documented below in the visit note. 

## 2016-09-23 ENCOUNTER — Telehealth: Payer: Self-pay | Admitting: Family Medicine

## 2016-09-23 DIAGNOSIS — Z0000 Encounter for general adult medical examination without abnormal findings: Secondary | ICD-10-CM

## 2016-09-23 DIAGNOSIS — E559 Vitamin D deficiency, unspecified: Secondary | ICD-10-CM

## 2016-09-23 NOTE — Telephone Encounter (Signed)
-----   Message from Ellamae Sia sent at 09/19/2016  4:48 PM EST ----- Regarding: Lab orders for Thursday, 3.15.18 Patient is scheduled for CPX labs, please order future labs, Thanks , Karna Christmas

## 2016-10-04 ENCOUNTER — Other Ambulatory Visit (INDEPENDENT_AMBULATORY_CARE_PROVIDER_SITE_OTHER): Payer: Managed Care, Other (non HMO)

## 2016-10-04 DIAGNOSIS — Z0000 Encounter for general adult medical examination without abnormal findings: Secondary | ICD-10-CM | POA: Diagnosis not present

## 2016-10-04 DIAGNOSIS — E559 Vitamin D deficiency, unspecified: Secondary | ICD-10-CM

## 2016-10-04 LAB — CBC WITH DIFFERENTIAL/PLATELET
BASOS PCT: 0.8 % (ref 0.0–3.0)
Basophils Absolute: 0 10*3/uL (ref 0.0–0.1)
EOS PCT: 2 % (ref 0.0–5.0)
Eosinophils Absolute: 0.1 10*3/uL (ref 0.0–0.7)
HEMATOCRIT: 39.5 % (ref 36.0–46.0)
HEMOGLOBIN: 13 g/dL (ref 12.0–15.0)
LYMPHS PCT: 26.4 % (ref 12.0–46.0)
Lymphs Abs: 1.7 10*3/uL (ref 0.7–4.0)
MCHC: 33 g/dL (ref 30.0–36.0)
MCV: 92.6 fl (ref 78.0–100.0)
Monocytes Absolute: 0.7 10*3/uL (ref 0.1–1.0)
Monocytes Relative: 11.5 % (ref 3.0–12.0)
Neutro Abs: 3.8 10*3/uL (ref 1.4–7.7)
Neutrophils Relative %: 59.3 % (ref 43.0–77.0)
Platelets: 233 10*3/uL (ref 150.0–400.0)
RBC: 4.26 Mil/uL (ref 3.87–5.11)
RDW: 14 % (ref 11.5–15.5)
WBC: 6.4 10*3/uL (ref 4.0–10.5)

## 2016-10-04 LAB — COMPREHENSIVE METABOLIC PANEL
ALBUMIN: 3.9 g/dL (ref 3.5–5.2)
ALK PHOS: 51 U/L (ref 39–117)
ALT: 17 U/L (ref 0–35)
AST: 17 U/L (ref 0–37)
BUN: 23 mg/dL (ref 6–23)
CALCIUM: 10.1 mg/dL (ref 8.4–10.5)
CHLORIDE: 104 meq/L (ref 96–112)
CO2: 31 mEq/L (ref 19–32)
CREATININE: 0.7 mg/dL (ref 0.40–1.20)
GFR: 89.2 mL/min (ref >60.00)
Glucose, Bld: 106 mg/dL — ABNORMAL HIGH (ref 70–99)
POTASSIUM: 4.5 meq/L (ref 3.5–5.1)
Sodium: 140 mEq/L (ref 135–145)
Total Bilirubin: 0.5 mg/dL (ref 0.2–1.2)
Total Protein: 6.8 g/dL (ref 6.0–8.3)

## 2016-10-04 LAB — VITAMIN D 25 HYDROXY (VIT D DEFICIENCY, FRACTURES): VITD: 25.71 ng/mL — ABNORMAL LOW (ref 30.00–100.00)

## 2016-10-04 LAB — LIPID PANEL
Cholesterol: 198 mg/dL (ref 0–200)
HDL: 72.4 mg/dL (ref >39.00)
LDL Cholesterol: 112 mg/dL — ABNORMAL HIGH (ref 0–99)
NonHDL: 126.03
TRIGLYCERIDES: 68 mg/dL (ref 0.0–149.0)
Total CHOL/HDL Ratio: 3
VLDL: 13.6 mg/dL (ref 0.0–40.0)

## 2016-10-04 LAB — TSH: TSH: 2.73 u[IU]/mL (ref 0.35–4.50)

## 2016-10-09 ENCOUNTER — Encounter: Payer: Self-pay | Admitting: Family Medicine

## 2016-10-09 ENCOUNTER — Ambulatory Visit (INDEPENDENT_AMBULATORY_CARE_PROVIDER_SITE_OTHER): Payer: Managed Care, Other (non HMO) | Admitting: Family Medicine

## 2016-10-09 ENCOUNTER — Other Ambulatory Visit (HOSPITAL_COMMUNITY)
Admission: RE | Admit: 2016-10-09 | Discharge: 2016-10-09 | Disposition: A | Discharge status: Home / Self Care | Payer: Managed Care, Other (non HMO) | Source: Ambulatory Visit | Attending: Family Medicine | Admitting: Family Medicine

## 2016-10-09 VITALS — BP 116/64 | HR 57 | Temp 98.4°F | Ht 64.25 in | Wt 196.5 lb

## 2016-10-09 DIAGNOSIS — I10 Essential (primary) hypertension: Secondary | ICD-10-CM

## 2016-10-09 DIAGNOSIS — Z0000 Encounter for general adult medical examination without abnormal findings: Secondary | ICD-10-CM | POA: Diagnosis not present

## 2016-10-09 DIAGNOSIS — E2839 Other primary ovarian failure: Secondary | ICD-10-CM | POA: Diagnosis not present

## 2016-10-09 DIAGNOSIS — Z23 Encounter for immunization: Secondary | ICD-10-CM | POA: Diagnosis not present

## 2016-10-09 DIAGNOSIS — M8589 Other specified disorders of bone density and structure, multiple sites: Secondary | ICD-10-CM

## 2016-10-09 DIAGNOSIS — Z6834 Body mass index (BMI) 34.0-34.9, adult: Secondary | ICD-10-CM

## 2016-10-09 DIAGNOSIS — Z01419 Encounter for gynecological examination (general) (routine) without abnormal findings: Secondary | ICD-10-CM | POA: Diagnosis not present

## 2016-10-09 DIAGNOSIS — E6609 Other obesity due to excess calories: Secondary | ICD-10-CM

## 2016-10-09 DIAGNOSIS — R7303 Prediabetes: Secondary | ICD-10-CM | POA: Insufficient documentation

## 2016-10-09 DIAGNOSIS — R7309 Other abnormal glucose: Secondary | ICD-10-CM

## 2016-10-09 DIAGNOSIS — E559 Vitamin D deficiency, unspecified: Secondary | ICD-10-CM

## 2016-10-09 MED ORDER — PNEUMOCOCCAL 13-VAL CONJ VACC IM SUSP
0.5000 mL | Freq: Once | INTRAMUSCULAR | Status: AC
  Administered 2016-10-09: 0.5 mL via INTRAMUSCULAR

## 2016-10-09 NOTE — Assessment & Plan Note (Signed)
bp in fair control at this time  BP Readings from Last 1 Encounters:  10/09/16 116/64   No changes needed Disc lifstyle change with low sodium diet and exercise  Labs rev Enc wt loss

## 2016-10-09 NOTE — Assessment & Plan Note (Signed)
Due for dexa s/p 5 y on evista No falls or fractures  Will inc vit D to 4000 iu daily

## 2016-10-09 NOTE — Assessment & Plan Note (Signed)
Will monitor this disc imp of low glycemic diet and wt loss to prevent DM2

## 2016-10-09 NOTE — Progress Notes (Signed)
Subjective:    Patient ID: Denise Ali, female    DOB: [birth year], 66 y.o.   MRN: [id number]  HPI Here for health maintenance exam and to review chronic medical problems   She is not using medicare   Doing pretty good overall  Hurt her R foot - it is improved (something heavy landed on it when working in the garage) Still a little swollen  Middle of top of foot - about a month ago   Wt Readings from Last 3 Encounters:  10/09/16 196 lb 8 oz (89.1 kg)  05/21/16 193 lb 8 oz (87.8 kg)  03/07/16 207 lb 8 oz (94.1 kg)  lost wt and gained a bit back (holiday eating)  Exercise - walking and goes to the gym on the weekends   bmi 33.4  Hep C/ HIV screening -declined   Mammogram nl 9/17 Strong family hx of breast cancer (has seen oncol to stratify risks and was on evista) Self breast exam -no lumps or changes   Pap/gyn care : nl pap 3/14  Not seeing GYN  Had a LEEP procedure years ago  Will do a pap today  No gyn symptoms at all  Does not desire std testing   Zoster vaccine 10/16 Flu vaccine 10/17 Tetanus vaccine 2014 Due for prevnar vaccine   Colonoscopy/ screening 8/14 colonoscopy with polyps and 5 year recall   dexa 5/15 osteopenia - slt lower bmd  Wants to go ahead and schedule  D is low 25.7- ran out of D 2 wk ago , usually takes 1000 iu  (supposed to be on 4000 iu daily)  5 y of evista  No falls No fractures known (did recently injure foot)    bp is stable today  No cp or palpitations or headaches or edema  No side effects to medicines  BP Readings from Last 3 Encounters:  10/09/16 116/64  05/21/16 140/82  03/07/16 126/78      Results for orders placed or performed in visit on 10/04/16  CBC with Differential/Platelet  Result Value Ref Range   WBC 6.4 4.0 - 10.5 K/uL   RBC 4.26 3.87 - 5.11 Mil/uL   Hemoglobin 13.0 12.0 - 15.0 g/dL   HCT 39.5 36.0 - 46.0 %   MCV 92.6 78.0 - 100.0 fl   MCHC 33.0 30.0 - 36.0 g/dL   RDW 14.0 11.5 - 15.5 %   Platelets 233.0 150.0 - 400.0 K/uL   Neutrophils Relative % 59.3 43.0 - 77.0 %   Lymphocytes Relative 26.4 12.0 - 46.0 %   Monocytes Relative 11.5 3.0 - 12.0 %   Eosinophils Relative 2.0 0.0 - 5.0 %   Basophils Relative 0.8 0.0 - 3.0 %   Neutro Abs 3.8 1.4 - 7.7 K/uL   Lymphs Abs 1.7 0.7 - 4.0 K/uL   Monocytes Absolute 0.7 0.1 - 1.0 K/uL   Eosinophils Absolute 0.1 0.0 - 0.7 K/uL   Basophils Absolute 0.0 0.0 - 0.1 K/uL  Comprehensive metabolic panel  Result Value Ref Range   Sodium 140 135 - 145 mEq/L   Potassium 4.5 3.5 - 5.1 mEq/L   Chloride 104 96 - 112 mEq/L   CO2 31 19 - 32 mEq/L   Glucose, Bld 106 (H) 70 - 99 mg/dL   BUN 23 6 - 23 mg/dL   Creatinine, Ser 0.70 0.40 - 1.20 mg/dL   Total Bilirubin 0.5 0.2 - 1.2 mg/dL   Alkaline Phosphatase 51 39 - 117 U/L   AST  17 0 - 37 U/L   ALT 17 0 - 35 U/L   Total Protein 6.8 6.0 - 8.3 g/dL   Albumin 3.9 3.5 - 5.2 g/dL   Calcium 10.1 8.4 - 10.5 mg/dL   GFR 89.20 >60.00 mL/min  Lipid panel  Result Value Ref Range   Cholesterol 198 0 - 200 mg/dL   Triglycerides 68.0 0.0 - 149.0 mg/dL   HDL 72.40 >39.00 mg/dL   VLDL 13.6 0.0 - 40.0 mg/dL   LDL Cholesterol 112 (H) 0 - 99 mg/dL   Total CHOL/HDL Ratio 3    NonHDL 126.03   TSH  Result Value Ref Range   TSH 2.73 0.35 - 4.50 uIU/mL  VITAMIN D 25 Hydroxy (Vit-D Deficiency, Fractures)  Result Value Ref Range   VITD 25.71 (L) 30.00 - 100.00 ng/mL      Borderline glucose at 106 fasting  Father had diet controlled dm     Review of Systems Review of Systems  Constitutional: Negative for fever, appetite change, fatigue and unexpected weight change.  Eyes: Negative for pain and visual disturbance.  Respiratory: Negative for cough and shortness of breath.   Cardiovascular: Negative for cp or palpitations    Gastrointestinal: Negative for nausea, diarrhea and constipation.  Genitourinary: Negative for urgency and frequency.  Skin: Negative for pallor or rash   MSK pos for R foot injury   Neurological: Negative for weakness, light-headedness, numbness and headaches.  Hematological: Negative for adenopathy. Does not bruise/bleed easily.  Psychiatric/Behavioral: Negative for dysphoric mood. The patient is not nervous/anxious.         Objective:   Physical Exam  Constitutional: She appears well-developed and well-nourished. No distress.  obese and well appearing   HENT:  Head: Normocephalic and atraumatic.  Right Ear: External ear normal.  Left Ear: External ear normal.  Mouth/Throat: Oropharynx is clear and moist.  Eyes: Conjunctivae and EOM are normal. Pupils are equal, round, and reactive to light. No scleral icterus.  Neck: Normal range of motion. Neck supple. No JVD present. Carotid bruit is not present. No thyromegaly present.  Cardiovascular: Normal rate, regular rhythm, normal heart sounds and intact distal pulses.  Exam reveals no gallop.   Pulmonary/Chest: Effort normal and breath sounds normal. No respiratory distress. She has no wheezes. She exhibits no tenderness.  Abdominal: Soft. Bowel sounds are normal. She exhibits no distension, no abdominal bruit and no mass. There is no tenderness.  Genitourinary: No breast swelling, tenderness, discharge or bleeding.  Genitourinary Comments: Breast exam: No mass, nodules, thickening, tenderness, bulging, retraction, inflamation, nipple discharge or skin changes noted.  No axillary or clavicular LA.      Musculoskeletal: Normal range of motion. She exhibits edema and tenderness.  Mid dorsal R foot is mildly swollen and tender w/o ecchymosis   No kyphosis   Lymphadenopathy:    She has no cervical adenopathy.  Neurological: She is alert. She has normal reflexes. No cranial nerve deficit. She exhibits normal muscle tone. Coordination normal.  Skin: Skin is warm and dry. No rash noted. No erythema. No pallor.  Solar lentigines diffusely  Psychiatric: She has a normal mood and affect.          Assessment & Plan:    Problem List Items Addressed This Visit      Cardiovascular and Mediastinum   Hypertension    bp in fair control at this time  BP Readings from Last 1 Encounters:  10/09/16 116/64   No changes needed Disc lifstyle change with  low sodium diet and exercise  Labs rev Enc wt loss         Musculoskeletal and Integument   Osteopenia    Due for dexa s/p 5 y on evista No falls or fractures  Will inc vit D to 4000 iu daily         Other   Elevated glucose level    Will monitor this disc imp of low glycemic diet and wt loss to prevent DM2       Encounter for routine gynecological examination    Routine exam with pap  Remote hx of LEEP  No gyn symptoms or findings      Relevant Orders   Cytology - PAP   Estrogen deficiency   Relevant Orders   DG Bone Density   Obesity    Discussed how this problem influences overall health and the risks it imposes  Reviewed plan for weight loss with lower calorie diet (via better food choices and also portion control or program like weight watchers) and exercise building up to or more than 30 minutes 5 days per week including some aerobic activity         Routine general medical examination at a health care facility - Primary    Reviewed health habits including diet and exercise and skin cancer prevention Reviewed appropriate screening tests for age  Also reviewed health mt list, fam hx and immunization status , as well as social and family history   Labs reviewed See HPI Not on medicare yet  dexa ordered  Continue to screen aggressively for breast cancer given family hx  Gyn exam with pap done today  prevnar vaccine today  Mildly elevated glucose- disc low glycemic diet and need for wt loss        Vitamin D deficiency    Pt was only taking 1000 iu daily instead of 4000 iu  Enc her to inc this  Level in 20s Has osteopenia Disc imp to bone and overall health       Other Visit Diagnoses    Need for vaccination with  13-polyvalent pneumococcal conjugate vaccine       Relevant Medications   pneumococcal 13-valent conjugate vaccine (PREVNAR 13) injection 0.5 mL (Completed)

## 2016-10-09 NOTE — Assessment & Plan Note (Signed)
Pt was only taking 1000 iu daily instead of 4000 iu  Enc her to inc this  Level in 20s Has osteopenia Disc imp to bone and overall health

## 2016-10-09 NOTE — Progress Notes (Signed)
Pre visit review using our clinic review tool, if applicable. No additional management support is needed unless otherwise documented below in the visit note. 

## 2016-10-09 NOTE — Assessment & Plan Note (Signed)
Routine exam with pap  Remote hx of LEEP  No gyn symptoms or findings

## 2016-10-09 NOTE — Assessment & Plan Note (Signed)
Discussed how this problem influences overall health and the risks it imposes  Reviewed plan for weight loss with lower calorie diet (via better food choices and also portion control or program like weight watchers) and exercise building up to or more than 30 minutes 5 days per week including some aerobic activity    

## 2016-10-09 NOTE — Patient Instructions (Addendum)
If your foot still hurts next week - let us know and I will order an xray   Stop at check out for dexa referral   Go up on vit D over the counter to 4000 iu daily  Also exercise and get outdoors -good for your bones   Your glucose is on the high end of normal  Avoid sweets and sugar drinks as much as you can (including juice) Also refined carbohydrates  Keep exercising  Keep working on weight loss  Gyn exam and pap today

## 2016-10-09 NOTE — Assessment & Plan Note (Signed)
Reviewed health habits including diet and exercise and skin cancer prevention Reviewed appropriate screening tests for age  Also reviewed health mt list, fam hx and immunization status , as well as social and family history   Labs reviewed See HPI Not on medicare yet  dexa ordered  Continue to screen aggressively for breast cancer given family hx  Gyn exam with pap done today  prevnar vaccine today  Mildly elevated glucose- disc low glycemic diet and need for wt loss

## 2016-10-12 LAB — CYTOLOGY - PAP
DIAGNOSIS: NEGATIVE
HPV: NOT DETECTED

## 2016-10-15 ENCOUNTER — Encounter: Payer: Self-pay | Admitting: *Deleted

## 2016-10-18 ENCOUNTER — Ambulatory Visit (INDEPENDENT_AMBULATORY_CARE_PROVIDER_SITE_OTHER)
Admission: RE | Admit: 2016-10-18 | Discharge: 2016-10-18 | Disposition: A | Discharge status: Home / Self Care | Payer: Managed Care, Other (non HMO) | Source: Ambulatory Visit | Attending: Family Medicine | Admitting: Family Medicine

## 2016-10-18 ENCOUNTER — Ambulatory Visit (INDEPENDENT_AMBULATORY_CARE_PROVIDER_SITE_OTHER): Payer: Managed Care, Other (non HMO) | Admitting: Family Medicine

## 2016-10-18 ENCOUNTER — Encounter: Payer: Self-pay | Admitting: Family Medicine

## 2016-10-18 VITALS — BP 132/80 | HR 64 | Temp 98.2°F | Wt 200.2 lb

## 2016-10-18 DIAGNOSIS — M8580 Other specified disorders of bone density and structure, unspecified site: Secondary | ICD-10-CM | POA: Diagnosis not present

## 2016-10-18 DIAGNOSIS — S99921D Unspecified injury of right foot, subsequent encounter: Secondary | ICD-10-CM

## 2016-10-18 NOTE — Patient Instructions (Addendum)
Right foot xray was looking ok. Possible foot strain or tendonitis.  Use post op shoe for the next several weeks.  May use aleve 220mg  over the counter with meals once daily for next 1 week. Elevate leg, rest as much as able.  Let us know if not improving with treatment.  We will call you if any change based on radiology read

## 2016-10-18 NOTE — Progress Notes (Signed)
   BP 132/80   Pulse 64   Temp 98.2 F (36.8 C) (Oral)   Wt 200 lb 4 oz (90.8 kg)   BMI 34.11 kg/m    CC: R foot pain Subjective:    Patient ID: Denise Ali, female    DOB: 01/13/1951, 66 y.o.   MRN: [id number]  HPI: Denise Ali is a 66 y.o. female presenting on 10/18/2016 for Foot Pain (right; dropped a wooden door ornament on foot)   1 month ago large wooden door ornament fell onto foot. Bruising noted the next day. She didn't have pain with weight bearing. Slowly improved but never fully better. Acutely worse after prolonged standing at work yesterday (out of the norm). Pain kept her awake last night. Describes burning pain inside foot.   Hasn't tried medication for this in the past. No ice.  Known h/o osteopenia - planned DEXA later this year. s/p evista 5 yrs. She did take fosamax until she started having R hip pain.   Relevant past medical, surgical, family and social history reviewed and updated as indicated. Interim medical history since our last visit reviewed. Allergies and medications reviewed and updated. Outpatient Medications Prior to Visit  Medication Sig Dispense Refill  . Calcium 1200-1000 MG-UNIT CHEW Chew 1 tablet by mouth daily.      . cholecalciferol (VITAMIN D) 1000 UNITS tablet Take 2,000 Units by mouth daily.     . hydrochlorothiazide (HYDRODIURIL) 25 MG tablet TAKE 1 TABLET (25 MG TOTAL) BY MOUTH DAILY. 30 tablet 11   No facility-administered medications prior to visit.      Per HPI unless specifically indicated in ROS section below Review of Systems     Objective:    BP 132/80   Pulse 64   Temp 98.2 F (36.8 C) (Oral)   Wt 200 lb 4 oz (90.8 kg)   BMI 34.11 kg/m   Wt Readings from Last 3 Encounters:  10/18/16 200 lb 4 oz (90.8 kg)  10/09/16 196 lb 8 oz (89.1 kg)  05/21/16 193 lb 8 oz (87.8 kg)    Physical Exam  Constitutional: She appears well-developed and well-nourished. No distress.  Musculoskeletal: She exhibits no  edema.  L foot WNL R midfoot swelling present, point tender to palpation along 2nd-4th metatarsal shafts, no pain at base of 5th MT 2+ DP bilaterally No pain with axial loading of toes  Skin: Skin is warm and dry. No rash noted.  Psychiatric: She has a normal mood and affect.  Nursing note and vitals reviewed.  RIGHT FOOT COMPLETE - 3+ VIEW COMPARISON:  None. FINDINGS: No acute fracture or dislocation is noted. Small calcaneal spurs are seen. No soft tissue abnormality is noted. IMPRESSION: No acute abnormality seen. Electronically Signed   By: Inez Catalina M.D.   On: 10/19/2016 08:29    Assessment & Plan:   Problem List Items Addressed This Visit    Osteopenia   Right foot injury, subsequent encounter - Primary    Xray to r/o MT shaft fracture, stress fx. xrays clear on my read. Will place in post op shoe for next 2 wks to give foot a break. Supportive care reviewed. rec NSAIDs prn discomfort. Update if not healing as expected. Pt agrees with plan.       Relevant Orders   DG Foot Complete Right (Completed)       Follow up plan: Return if symptoms worsen or fail to improve.  Ria Bush, MD

## 2016-10-18 NOTE — Progress Notes (Signed)
Pre visit review using our clinic review tool, if applicable. No additional management support is needed unless otherwise documented below in the visit note. 

## 2016-10-20 NOTE — Assessment & Plan Note (Addendum)
Xray to r/o MT shaft fracture, stress fx. xrays clear on my read. Will place in post op shoe for next 2 wks to give foot a break. Supportive care reviewed. rec NSAIDs prn discomfort. Update if not healing as expected. Pt agrees with plan.

## 2016-10-30 ENCOUNTER — Inpatient Hospital Stay: Admission: RE | Admit: 2016-10-30 | Payer: Managed Care, Other (non HMO) | Source: Ambulatory Visit

## 2016-11-02 ENCOUNTER — Other Ambulatory Visit: Payer: Managed Care, Other (non HMO)

## 2016-11-05 ENCOUNTER — Ambulatory Visit (INDEPENDENT_AMBULATORY_CARE_PROVIDER_SITE_OTHER)
Admission: RE | Admit: 2016-11-05 | Discharge: 2016-11-05 | Disposition: A | Discharge status: Home / Self Care | Payer: Managed Care, Other (non HMO) | Source: Ambulatory Visit | Attending: Family Medicine | Admitting: Family Medicine

## 2016-11-05 DIAGNOSIS — E2839 Other primary ovarian failure: Secondary | ICD-10-CM

## 2016-11-13 ENCOUNTER — Encounter: Payer: Self-pay | Admitting: *Deleted

## 2016-11-27 ENCOUNTER — Encounter: Payer: 59 | Admitting: Nurse Practitioner

## 2016-11-27 ENCOUNTER — Encounter: Payer: 59 | Admitting: Adult Health

## 2017-03-12 ENCOUNTER — Other Ambulatory Visit: Payer: Self-pay | Admitting: Family Medicine

## 2017-04-25 ENCOUNTER — Encounter: Payer: Self-pay | Admitting: Family Medicine

## 2017-05-17 ENCOUNTER — Ambulatory Visit (INDEPENDENT_AMBULATORY_CARE_PROVIDER_SITE_OTHER): Payer: 59 | Admitting: Family Medicine

## 2017-05-17 ENCOUNTER — Encounter: Payer: Self-pay | Admitting: Family Medicine

## 2017-05-17 VITALS — Temp 98.5°F | Ht 64.25 in | Wt 213.0 lb

## 2017-05-17 DIAGNOSIS — S90111A Contusion of right great toe without damage to nail, initial encounter: Secondary | ICD-10-CM

## 2017-05-17 NOTE — Patient Instructions (Signed)
WE NOW OFFER   [facility] FAST TRACK!!!  SAME DAY Appointments for ACUTE CARE  Such as: Sprains, Injuries, cuts, abrasions, rashes, muscle pain, joint pain, back pain Colds, flu, sore throats, headache, allergies, cough, fever  Ear pain, sinus and eye infections Abdominal pain, nausea, vomiting, diarrhea, upset stomach Animal/insect bites  3 Easy Ways to Schedule: Walk-In Scheduling Call in scheduling Mychart Sign-up: [email or web address]         

## 2017-05-17 NOTE — Progress Notes (Signed)
   Subjective:    Patient ID: Denise Ali, female    DOB: January 06, 1951, 66 y.o.   MRN: [id number]  HPI Here for a toe she is worried about. She noticed the right great toe was mildly tender and was blue in color about 3 weeks ago. This started the day after she toured the [employer] and walked around all day in a brand new pair of shoes. The toe has not changed much since then.    Review of Systems  Constitutional: Negative.   Respiratory: Negative.   Cardiovascular: Negative.   Skin: Positive for color change.       Objective:   Physical Exam  Constitutional: She appears well-developed and well-nourished.  Cardiovascular: Normal rate, regular rhythm, normal heart sounds and intact distal pulses.   Pulmonary/Chest: Effort normal and breath sounds normal. No respiratory distress. She has no wheezes. She has no rales.  Skin:  The right great toenail is thickened and the proximal 1/3 of the nail has ecchymosis beneath it. No tenderness or erythema. The nail is firmly attached to the bed           Assessment & Plan:  She has contused the toe by walking all day in a tight pair of shoes. The nail also shows fungal involvement. She is reassured that the bruising will heal in time and that she will not lose the nail. Recheck prn.  Alysia Penna, MD

## 2017-07-23 HISTORY — PX: COLONOSCOPY W/ POLYPECTOMY: SHX1380

## 2017-07-29 ENCOUNTER — Ambulatory Visit (INDEPENDENT_AMBULATORY_CARE_PROVIDER_SITE_OTHER): Payer: 59 | Admitting: Family Medicine

## 2017-07-29 ENCOUNTER — Encounter: Payer: Self-pay | Admitting: Family Medicine

## 2017-07-29 VITALS — BP 110/60 | HR 85 | Temp 98.5°F | Wt 207.8 lb

## 2017-07-29 DIAGNOSIS — K529 Noninfective gastroenteritis and colitis, unspecified: Secondary | ICD-10-CM

## 2017-07-29 DIAGNOSIS — J101 Influenza due to other identified influenza virus with other respiratory manifestations: Secondary | ICD-10-CM | POA: Diagnosis not present

## 2017-07-29 DIAGNOSIS — R52 Pain, unspecified: Secondary | ICD-10-CM | POA: Diagnosis not present

## 2017-07-29 LAB — POCT INFLUENZA A/B
INFLUENZA A, POC: POSITIVE — AB
INFLUENZA B, POC: NEGATIVE

## 2017-07-29 MED ORDER — ONDANSETRON 4 MG PO TBDP: 4.0000 mg | ORAL_TABLET | Freq: Three times a day (TID) | ORAL | 0 refills | Status: DC | PRN

## 2017-07-29 MED ORDER — OSELTAMIVIR PHOSPHATE 75 MG PO CAPS: 75.0000 mg | ORAL_CAPSULE | Freq: Two times a day (BID) | ORAL | 0 refills | Status: DC

## 2017-07-29 NOTE — Patient Instructions (Signed)
Drink small amounts of liquids- 1-2 ounces every 20-30 minutes Once nausea relieved, start Tamiflu Drink enough to make your urine light yellow Can take ibuprofen/acetaminophen of aches/fever once nausea improved  Food Choices to Help Relieve Diarrhea, Adult When you have diarrhea, the foods you eat and your eating habits are very important. Choosing the right foods and drinks can help:  Relieve diarrhea.  Replace lost fluids and nutrients.  Prevent dehydration.  What general guidelines should I follow? Relieving diarrhea  Choose foods with less than 2 g or .07 oz. of fiber per serving.  Limit fats to less than 8 tsp (38 g or 1.34 oz.) a day.  Avoid the following: ? Foods and beverages sweetened with high-fructose corn syrup, honey, or sugar alcohols such as xylitol, sorbitol, and mannitol. ? Foods that contain a lot of fat or sugar. ? Fried, greasy, or spicy foods. ? High-fiber grains, breads, and cereals. ? Raw fruits and vegetables.  Eat foods that are rich in probiotics. These foods include dairy products such as yogurt and fermented milk products. They help increase healthy bacteria in the stomach and intestines (gastrointestinal tract, or GI tract).  If you have lactose intolerance, avoid dairy products. These may make your diarrhea worse.  Take medicine to help stop diarrhea (antidiarrheal medicine) only as told by your health care provider. Replacing nutrients  Eat small meals or snacks every 3-4 hours.  Eat bland foods, such as white rice, toast, or baked potato, until your diarrhea starts to get better. Gradually reintroduce nutrient-rich foods as tolerated or as told by your health care provider. This includes: ? Well-cooked protein foods. ? Peeled, seeded, and soft-cooked fruits and vegetables. ? Low-fat dairy products.  Take vitamin and mineral supplements as told by your health care provider. Preventing dehydration   Start by sipping water or a special  solution to prevent dehydration (oral rehydration solution, ORS). Urine that is clear or pale yellow means that you are getting enough fluid.  Try to drink at least 8-10 cups of fluid each day to help replace lost fluids.  You may add other liquids in addition to water, such as clear juice or decaffeinated sports drinks, as tolerated or as told by your health care provider.  Avoid drinks with caffeine, such as coffee, tea, or soft drinks.  Avoid alcohol. What foods are recommended? The items listed may not be a complete list. Talk with your health care provider about what dietary choices are best for you. Grains White rice. White, [place], or pita breads (fresh or toasted), including plain rolls, buns, or bagels. White pasta. Saltine, soda, or graham crackers. Pretzels. Low-fiber cereal. Cooked cereals made with water (such as cornmeal, farina, or cream cereals). Plain muffins. Matzo. Melba toast. Zwieback. Vegetables Potatoes (without the skin). Most well-cooked and canned vegetables without skins or seeds. Tender lettuce. Fruits Apple sauce. Fruits canned in juice. Cooked apricots, cherries, grapefruit, peaches, pears, or plums. Fresh bananas and cantaloupe. Meats and other protein foods Baked or boiled chicken. Eggs. Tofu. Fish. Seafood. Smooth nut butters. Ground or well-cooked tender beef, ham, veal, lamb, pork, or poultry. Dairy Plain yogurt, kefir, and unsweetened liquid yogurt. Lactose-free milk, buttermilk, skim milk, or soy milk. Low-fat or nonfat hard cheese. Beverages Water. Low-calorie sports drinks. Fruit juices without pulp. Strained tomato and vegetable juices. Decaffeinated teas. Sugar-free beverages not sweetened with sugar alcohols. Oral rehydration solutions, if approved by your health care provider. Seasoning and other foods Bouillon, broth, or soups made from recommended foods. What  foods are not recommended? The items listed may not be a complete list. Talk with your  health care provider about what dietary choices are best for you. Grains Whole grain, whole wheat, bran, or rye breads, rolls, pastas, and crackers. Wild or brown rice. Whole grain or bran cereals. Barley. Oats and oatmeal. Corn tortillas or taco shells. Granola. Popcorn. Vegetables Raw vegetables. Fried vegetables. Cabbage, broccoli, Brussels sprouts, artichokes, baked beans, beet greens, corn, kale, legumes, peas, sweet potatoes, and yams. Potato skins. Cooked spinach and cabbage. Fruits Dried fruit, including raisins and dates. Raw fruits. Stewed or dried prunes. Canned fruits with syrup. Meat and other protein foods Fried or fatty meats. Deli meats. Chunky nut butters. Nuts and seeds. Beans and lentils. Berniece Salines. Hot dogs. Sausage. Dairy High-fat cheeses. Whole milk, chocolate milk, and beverages made with milk, such as milk shakes. Half-and-half. Cream. sour cream. Ice cream. Beverages Caffeinated beverages (such as coffee, tea, soda, or energy drinks). Alcoholic beverages. Fruit juices with pulp. Prune juice. Soft drinks sweetened with high-fructose corn syrup or sugar alcohols. High-calorie sports drinks. Fats and oils Butter. Cream sauces. Margarine. Salad oils. Plain salad dressings. Olives. Avocados. Mayonnaise. Sweets and desserts Sweet rolls, doughnuts, and sweet breads. Sugar-free desserts sweetened with sugar alcohols such as xylitol and sorbitol. Seasoning and other foods Honey. Hot sauce. Chili powder. Gravy. Cream-based or milk-based soups. Pancakes and waffles. Summary  When you have diarrhea, the foods you eat and your eating habits are very important.  Make sure you get at least 8-10 cups of fluid each day, or enough to keep your urine clear or pale yellow.  Eat bland foods and gradually reintroduce healthy, nutrient-rich foods as tolerated, or as told by your health care provider.  Avoid high-fiber, fried, greasy, or spicy foods. This information is not intended to  replace advice given to you by your health care provider. Make sure you discuss any questions you have with your health care provider. Document Released: 09/29/2003 Document Revised: 07/06/2016 Document Reviewed: 07/06/2016 Elsevier Interactive Patient Education  2018 [employer].   Influenza, Adult Influenza, more commonly known as "the flu," is a viral infection that primarily affects the respiratory tract. The respiratory tract includes organs that help you breathe, such as the lungs, nose, and throat. The flu causes many common cold symptoms, as well as a high fever and body aches. The flu spreads easily from person to person (is contagious). Getting a flu shot (influenza vaccination) every year is the best way to prevent influenza. What are the causes? Influenza is caused by a virus. You can catch the virus by:  Breathing in droplets from an infected person's cough or sneeze.  Touching something that was recently contaminated with the virus and then touching your mouth, nose, or eyes.  What increases the risk? The following factors may make you more likely to get the flu:  Not cleaning your hands frequently with soap and water or alcohol-based hand sanitizer.  Having close contact with many people during cold and flu season.  Touching your mouth, eyes, or nose without washing or sanitizing your hands first.  Not drinking enough fluids or not eating a healthy diet.  Not getting enough sleep or exercise.  Being under a high amount of stress.  Not getting a yearly (annual) flu shot.  You may be at a higher risk of complications from the flu, such as a severe lung infection (pneumonia), if you:  Are over the age of 7.  Are pregnant.  Have a  weakened disease-fighting system (immune system). You may have a weakened immune system if you: ? Have HIV or AIDS. ? Are undergoing chemotherapy. ? Aretaking medicines that reduce the activity of (suppress) the immune system.  Have  a long-term (chronic) illness, such as heart disease, kidney disease, diabetes, or lung disease.  Have a liver disorder.  Are obese.  Have anemia.  What are the signs or symptoms? Symptoms of this condition typically last 4-10 days and may include:  Fever.  Chills.  Headache, body aches, or muscle aches.  Sore throat.  Cough.  Runny or congested nose.  Chest discomfort and cough.  Poor appetite.  Weakness or tiredness (fatigue).  Dizziness.  Nausea or vomiting.  How is this diagnosed? This condition may be diagnosed based on your medical history and a physical exam. Your health care provider may do a nose or throat swab test to confirm the diagnosis. How is this treated? If influenza is detected early, you can be treated with antiviral medicine that can reduce the length of your illness and the severity of your symptoms. This medicine may be given by mouth (orally) or through an IV tube that is inserted in one of your veins. The goal of treatment is to relieve symptoms by taking care of yourself at home. This may include taking over-the-counter medicines, drinking plenty of fluids, and adding humidity to the air in your home. In some cases, influenza goes away on its own. Severe influenza or complications from influenza may be treated in a hospital. Follow these instructions at home:  Take over-the-counter and prescription medicines only as told by your health care provider.  Use a cool mist humidifier to add humidity to the air in your home. This can make breathing easier.  Rest as needed.  Drink enough fluid to keep your urine clear or pale yellow.  Cover your mouth and nose when you cough or sneeze.  Wash your hands with soap and water often, especially after you cough or sneeze. If soap and water are not available, use hand sanitizer.  Stay home from work or school as told by your health care provider. Unless you are visiting your health care provider, try to  avoid leaving home until your fever has been gone for 24 hours without the use of medicine.  Keep all follow-up visits as told by your health care provider. This is important. How is this prevented?  Getting an annual flu shot is the best way to avoid getting the flu. You may get the flu shot in late summer, fall, or winter. Ask your health care provider when you should get your flu shot.  Wash your hands often or use hand sanitizer often.  Avoid contact with people who are sick during cold and flu season.  Eat a healthy diet, drink plenty of fluids, get enough sleep, and exercise regularly. Contact a health care provider if:  You develop new symptoms.  You have: ? Chest pain. ? Diarrhea. ? A fever.  Your cough gets worse.  You produce more mucus.  You feel nauseous or you vomit. Get help right away if:  You develop shortness of breath or difficulty breathing.  Your skin or nails turn a bluish color.  You have severe pain or stiffness in your neck.  You develop a sudden headache or sudden pain in your face or ear.  You cannot stop vomiting. This information is not intended to replace advice given to you by your health care provider. Make sure  you discuss any questions you have with your health care provider. Document Released: 07/06/2000 Document Revised: 12/15/2015 Document Reviewed: 05/03/2015 Elsevier Interactive Patient Education  2017 [employer].

## 2017-07-29 NOTE — Progress Notes (Signed)
Subjective:    Patient ID: Denise Ali, female    DOB: January 07, 1951, 67 y.o.   MRN: [id number]  HPI This is a 67 yo female who presents today with 1 day of generalized body aches. Feels hot and cold. No cough. Feels winded, no wheeze.  Has had 5-6 episodes of vomiting and diarrhea. Bowel movements watery, last about 7 hours ago. Has not eaten or had anything to drink today. No abdominal pain. Has been around family members with similar symptoms. DIL in hospital with "flu."  Has not taken any medications for symptoms.  Patient did receive flu vaccine this season.    Past Medical History:  Diagnosis Date  . Bradycardia 9/12   mild - at one time with PACs/ asymptomatic   . Breast cancer screening, high risk patient 10/18/2011  . Colon polyp 12/2007  . Disorder of bone and cartilage, unspecified   . Edema   . Family history of malignant neoplasm of breast    evista for BRCA ppx  . Fibrocystic breast   . 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 Hypopotassemia   . Neoplasm of uncertain behavior of skin   . Other screening mammogram   . Predominant disturbance of emotions   . Routine gynecological examination   . Unspecified vitamin D deficiency    Past Surgical History:  Procedure Laterality Date  . BREAST BIOPSY  01/2010  . BREAST BIOPSY  8/11   Fibrocystic change  . Breast MRI  01/2010   Normal  . cataracts  9/12   bilateral  . DEXA  12/2003   osteopenia  . DEXA  11/07   stable  . DEXA  1/10   Osteopenia-slightly improved  . LEEP     HPV  . procedure for "bad pap"    . TONSILLECTOMY     Family History  Problem Relation Age of Onset  . Breast cancer Mother   . Osteoporosis Mother   . Diabetes Father   . Skin cancer Father        ?  Marland Kitchen Osteopenia Sister   . Breast cancer Sister   . Breast cancer Unknown        cousin/Grandmother  . Heart attack Paternal Grandfather   . Colon cancer Neg Hx    Social History    Tobacco Use  . Smoking status: Former Smoker    Last attempt to quit: 07/23/1985    Years since quitting: 32.0  . Smokeless tobacco: Never Used  . Tobacco comment: Quit "years ago"  Substance Use Topics  . Alcohol use: Yes    Alcohol/week: 0.0 oz    Comment: Occasional  . Drug use: No      Review of Systems  Per HPI    Objective:   Physical Exam  Constitutional: She is oriented to person, place, and time. She appears well-developed and well-nourished. No distress.  HENT:  Head: Normocephalic and atraumatic.  Mouth/Throat: Oropharynx is clear and moist.  Eyes: Conjunctivae are normal.  Cardiovascular: Normal rate, regular rhythm and normal heart sounds.  Pulmonary/Chest: Effort normal and breath sounds normal.  Abdominal: Soft. Bowel sounds are normal. She exhibits no distension. There is tenderness (mild, generalized). There is no rebound and no guarding.  Neurological: She is alert and oriented to person, place, and time.  Skin: Skin is warm and dry. She is not diaphoretic.  Psychiatric: She has a normal mood and affect. Her behavior is normal. Judgment and thought  content normal.  Vitals reviewed.     BP 110/60 (BP Location: Right Arm, Patient Position: Sitting, Cuff Size: Normal)   Pulse 85   Temp 98.5 F (36.9 C) (Oral)   Wt 207 lb 12 oz (94.2 kg)   SpO2 95%   BMI 35.38 kg/m  Wt Readings from Last 3 Encounters:  07/29/17 207 lb 12 oz (94.2 kg)  05/17/17 213 lb (96.6 kg)  10/18/16 200 lb 4 oz (90.8 kg)   POCT influenza- positive A    Assessment & Plan:  1. Body aches - POCT Influenza A/B- positive for A  2. Influenza A - Provided written and verbal information regarding diagnosis and treatment. Discussed potential side effects of oseltamivir and patient wishes to take - RTC precautions reviewed - oseltamivir (TAMIFLU) 75 MG capsule; Take 1 capsule (75 mg total) by mouth 2 (two) times daily.  Dispense: 10 capsule; Refill: 0  3. Gastroenteritis -  provided written and verbal instructions regarding rehydration/ slow advancement of diet.  - RTC precautions reviewed - ondansetron (ZOFRAN-ODT) 4 MG disintegrating tablet; Take 1 tablet (4 mg total) by mouth every 8 (eight) hours as needed for nausea.  Dispense: 12 tablet; Refill: 0   Clarene Reamer, FNP-BC  [facility] ([place]  07/30/2017 9:39 PM

## 2017-07-30 ENCOUNTER — Encounter: Payer: Self-pay | Admitting: Family Medicine

## 2017-08-09 ENCOUNTER — Other Ambulatory Visit: Payer: Self-pay | Admitting: *Deleted

## 2017-08-09 MED ORDER — HYDROCHLOROTHIAZIDE 25 MG PO TABS: ORAL_TABLET | ORAL | 0 refills | Status: DC

## 2017-11-20 ENCOUNTER — Telehealth: Payer: Self-pay | Admitting: Family Medicine

## 2017-11-20 NOTE — Telephone Encounter (Signed)
Copied from [place] [id number]. Topic: Quick Communication - Rx Refill/Question >> Nov 20, 2017  6:29 PM Oliver Pila B wrote: Medication: hydrochlorothiazide (HYDRODIURIL) 25 MG tablet [[id number]]  Has the patient contacted their pharmacy? Yes.   (Agent: If no, request that the patient contact the pharmacy for the refill.) Preferred Pharmacy (with phone number or street name): [facility] Agent: Please be advised that RX refills may take up to 3 business days. We ask that you follow-up with your pharmacy.

## 2017-11-21 MED ORDER — HYDROCHLOROTHIAZIDE 25 MG PO TABS: ORAL_TABLET | ORAL | 0 refills | Status: DC

## 2017-11-21 NOTE — Telephone Encounter (Signed)
Pt notified that she needed to schedule an OV to continue to get refills on this medication. Pt notified that a temporary refill could be given until seen for an OV. Pt states she is in the car now and does not have her schedule in front of her but she can call back late this afternoon or tomorrow morning to schedule appt.

## 2017-11-22 ENCOUNTER — Telehealth: Payer: Self-pay | Admitting: Family Medicine

## 2017-11-22 NOTE — Telephone Encounter (Signed)
HCTZ was sent to pharmacy on 11/21/17.

## 2017-11-22 NOTE — Telephone Encounter (Signed)
Copied from [place] [id number]. Topic: Quick Communication - Rx Refill/Question >> Nov 20, 2017  6:29 PM Oliver Pila B wrote: Medication: hydrochlorothiazide (HYDRODIURIL) 25 MG tablet [[id number]]  Has the patient contacted their pharmacy? Yes.   (Agent: If no, request that the patient contact the pharmacy for the refill.) Preferred Pharmacy (with phone number [facility]): [facility] Agent: Please be advised that RX refills may take up to 3 business days. We ask that you follow-up with your pharmacy. >> Nov 22, 2017  9:08 AM Cleaster Corin, NT wrote: Pt. Has called back to schedule appt. OV 11/27/17 at 2:30pm  and CPE 01/22/18 @2 :30pm

## 2017-11-22 NOTE — Telephone Encounter (Signed)
Rx filled 5/2. Confirm received by pharmacy;spoke to pt and advised

## 2017-11-27 ENCOUNTER — Encounter: Payer: Self-pay | Admitting: Family Medicine

## 2017-11-27 ENCOUNTER — Ambulatory Visit (INDEPENDENT_AMBULATORY_CARE_PROVIDER_SITE_OTHER): Payer: 59 | Admitting: Family Medicine

## 2017-11-27 VITALS — BP 118/68 | HR 68 | Temp 98.1°F | Ht 64.25 in | Wt 208.5 lb

## 2017-11-27 DIAGNOSIS — I10 Essential (primary) hypertension: Secondary | ICD-10-CM | POA: Diagnosis not present

## 2017-11-27 MED ORDER — HYDROCHLOROTHIAZIDE 25 MG PO TABS: ORAL_TABLET | ORAL | 3 refills | Status: DC

## 2017-11-27 NOTE — Patient Instructions (Signed)
Blood pressure looks great  Keep up the good work with diet /exercise (weight watchers)   Take care of yourself    See you in July

## 2017-11-27 NOTE — Progress Notes (Signed)
Subjective:    Patient ID: Denise Ali, female    DOB: 10-Mar-1951, 67 y.o.   MRN: [id number]  HPI  Here for HTN follow up   Has been working a lot  Doing well overall  Will take a week off in June    Wt Readings from Last 3 Encounters:  11/27/17 208 lb 8 oz (94.6 kg)  07/29/17 207 lb 12 oz (94.2 kg)  05/17/17 213 lb (96.6 kg)  has been loosing some weight  She started weight watchers - 3 weeks ago - ok so far  Walking for exercise-needs to do more  Feels better eating less processed carbs overall  35.51 kg/m   Lab Results  Component Value Date   CREATININE 0.70 10/04/2016   BUN 23 10/04/2016   NA 140 10/04/2016   K 4.5 10/04/2016   CL 104 10/04/2016   CO2 31 10/04/2016   Lab Results  Component Value Date   CHOL 198 10/04/2016   HDL 72.40 10/04/2016   LDLCALC 112 (H) 10/04/2016   TRIG 68.0 10/04/2016   CHOLHDL 3 10/04/2016   bp is stable today  No cp or palpitations or headaches or edema  No side effects to medicines  BP Readings from Last 3 Encounters:  11/27/17 118/68  07/29/17 110/60  10/18/16 132/80    On hctz  No side effects  Needs to drink more water always   Has PE fit in for July   Started claritin for allergies  Ears feel a bit stopped up    Patient Active Problem List   Diagnosis Date Noted  . Right foot injury, subsequent encounter 10/18/2016  . Estrogen deficiency 10/09/2016  . Elevated glucose level 10/09/2016  . Conjunctivitis 05/21/2016  . Obesity 12/24/2014  . Hypertension 10/05/2014  . Breast cancer screening, high risk patient 10/18/2011  . Routine general medical examination at a health care facility 07/02/2011  . Encounter for routine gynecological examination 07/02/2011  . Irregular heart beat 04/09/2011  . HYPOKALEMIA 04/22/2009  . Vitamin D deficiency 02/24/2009  . STRESS REACTION, ACUTE, WITH EMOTIONAL DISTURBANCE 11/25/2007  . Osteopenia 01/14/2007  . EDEMA 01/14/2007   Past Medical History:  Diagnosis  Date  . Bradycardia 9/12   mild - at one time with PACs/ asymptomatic   . Breast cancer screening, high risk patient 10/18/2011  . Colon polyp 12/2007  . Disorder of bone and cartilage, unspecified   . Edema   . Family history of malignant neoplasm of breast    evista for BRCA ppx  . Fibrocystic breast   . 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 Hypopotassemia   . Neoplasm of uncertain behavior of skin   . Other screening mammogram   . Predominant disturbance of emotions   . Routine gynecological examination   . Unspecified vitamin D deficiency    Past Surgical History:  Procedure Laterality Date  . BREAST BIOPSY  01/2010  . BREAST BIOPSY  8/11   Fibrocystic change  . Breast MRI  01/2010   Normal  . cataracts  9/12   bilateral  . DEXA  12/2003   osteopenia  . DEXA  11/07   stable  . DEXA  1/10   Osteopenia-slightly improved  . LEEP     HPV  . procedure for "bad pap"    . TONSILLECTOMY     Social History   Tobacco Use  . Smoking status: Former Smoker    Last attempt to  quit: 07/23/1985    Years since quitting: 32.3  . Smokeless tobacco: Never Used  . Tobacco comment: Quit "years ago"  Substance Use Topics  . Alcohol use: Yes    Alcohol/week: 0.0 oz    Comment: Occasional  . Drug use: No   Family History  Problem Relation Age of Onset  . Breast cancer Mother   . Osteoporosis Mother   . Diabetes Father   . Skin cancer Father        ?  Marland Kitchen Osteopenia Sister   . Breast cancer Sister   . Breast cancer Unknown        cousin/Grandmother  . Heart attack Paternal Grandfather   . Colon cancer Neg Hx    Allergies  Allergen Reactions  . Alendronate Sodium Other (See Comments)    REACTION: leg and joint pain   Current Outpatient Medications on File Prior to Visit  Medication Sig Dispense Refill  . Calcium 1200-1000 MG-UNIT CHEW Chew 1 tablet by mouth daily.      . cholecalciferol (VITAMIN D) 1000 UNITS tablet Take 2,000  Units by mouth daily.     . chlorhexidine (PERIDEX) 0.12 % solution SWISH WITH 1 CAPFUL FOR 30 SECONDS THEN SPIT TWICE DAILY. BEGIN 1 WEEK PRIOR TO SURGERY  0   No current facility-administered medications on file prior to visit.     Review of Systems  Constitutional: Negative for activity change, appetite change, fatigue, fever and unexpected weight change.  HENT: Negative for congestion, ear pain, rhinorrhea, sinus pressure and sore throat.   Eyes: Negative for pain, redness and visual disturbance.  Respiratory: Negative for cough, shortness of breath and wheezing.   Cardiovascular: Negative for chest pain and palpitations.  Gastrointestinal: Negative for abdominal pain, blood in stool, constipation and diarrhea.  Endocrine: Negative for polydipsia and polyuria.  Genitourinary: Negative for dysuria, frequency and urgency.  Musculoskeletal: Negative for arthralgias, back pain and myalgias.  Skin: Negative for pallor and rash.  Allergic/Immunologic: Negative for environmental allergies.  Neurological: Negative for dizziness, syncope and headaches.  Hematological: Negative for adenopathy. Does not bruise/bleed easily.  Psychiatric/Behavioral: Negative for decreased concentration and dysphoric mood. The patient is not nervous/anxious.        Objective:   Physical Exam  Constitutional: She appears well-developed and well-nourished. No distress.  obese and well appearing   HENT:  Head: Normocephalic and atraumatic.  Mouth/Throat: Oropharynx is clear and moist.  Eyes: Pupils are equal, round, and reactive to light. Conjunctivae and EOM are normal.  Neck: Normal range of motion. Neck supple. No JVD present. Carotid bruit is not present. No thyromegaly present.  Cardiovascular: Normal rate, regular rhythm, normal heart sounds and intact distal pulses. Exam reveals no gallop.  Pulmonary/Chest: Effort normal and breath sounds normal. No respiratory distress. She has no wheezes. She has no  rales.  No crackles  Abdominal: She exhibits no abdominal bruit.  Musculoskeletal: She exhibits no edema.  Lymphadenopathy:    She has no cervical adenopathy.  Neurological: She is alert. She has normal reflexes. She displays normal reflexes.  Skin: Skin is warm and dry. No rash noted.  Psychiatric: She has a normal mood and affect.  pleasant          Assessment & Plan:   Problem List Items Addressed This Visit      Cardiovascular and Mediastinum   Hypertension - Primary    Well controlled  hctz refilled Enc further good work with diet/exercise and wt loss  Lab and annual  exam planned for july      Relevant Medications   hydrochlorothiazide (HYDRODIURIL) 25 MG tablet

## 2017-11-27 NOTE — Assessment & Plan Note (Signed)
Well controlled  hctz refilled Enc further good work with diet/exercise and wt loss  Lab and annual exam planned for july

## 2018-01-12 ENCOUNTER — Encounter: Payer: Self-pay | Admitting: Gastroenterology

## 2018-01-22 ENCOUNTER — Encounter: Payer: Self-pay | Admitting: Family Medicine

## 2018-01-22 ENCOUNTER — Ambulatory Visit (INDEPENDENT_AMBULATORY_CARE_PROVIDER_SITE_OTHER): Payer: 59 | Admitting: Family Medicine

## 2018-01-22 ENCOUNTER — Encounter: Payer: Self-pay | Admitting: Gastroenterology

## 2018-01-22 VITALS — BP 122/74 | HR 60 | Temp 98.2°F | Ht 64.25 in | Wt 197.5 lb

## 2018-01-22 DIAGNOSIS — Z23 Encounter for immunization: Secondary | ICD-10-CM | POA: Diagnosis not present

## 2018-01-22 DIAGNOSIS — R7309 Other abnormal glucose: Secondary | ICD-10-CM

## 2018-01-22 DIAGNOSIS — E6609 Other obesity due to excess calories: Secondary | ICD-10-CM | POA: Diagnosis not present

## 2018-01-22 DIAGNOSIS — E559 Vitamin D deficiency, unspecified: Secondary | ICD-10-CM

## 2018-01-22 DIAGNOSIS — I10 Essential (primary) hypertension: Secondary | ICD-10-CM | POA: Diagnosis not present

## 2018-01-22 DIAGNOSIS — K635 Polyp of colon: Secondary | ICD-10-CM | POA: Insufficient documentation

## 2018-01-22 DIAGNOSIS — M8580 Other specified disorders of bone density and structure, unspecified site: Secondary | ICD-10-CM | POA: Diagnosis not present

## 2018-01-22 DIAGNOSIS — Z6833 Body mass index (BMI) 33.0-33.9, adult: Secondary | ICD-10-CM

## 2018-01-22 DIAGNOSIS — Z0000 Encounter for general adult medical examination without abnormal findings: Secondary | ICD-10-CM | POA: Diagnosis not present

## 2018-01-22 DIAGNOSIS — D126 Benign neoplasm of colon, unspecified: Secondary | ICD-10-CM | POA: Diagnosis not present

## 2018-01-22 LAB — COMPREHENSIVE METABOLIC PANEL
ALK PHOS: 55 U/L (ref 39–117)
ALT: 11 U/L (ref 0–35)
AST: 18 U/L (ref 0–37)
Albumin: 4.2 g/dL (ref 3.5–5.2)
BUN: 12 mg/dL (ref 6–23)
CHLORIDE: 100 meq/L (ref 96–112)
CO2: 31 meq/L (ref 19–32)
Calcium: 10.1 mg/dL (ref 8.4–10.5)
Creatinine, Ser: 0.63 mg/dL (ref 0.40–1.20)
GFR: 100.33 mL/min (ref >60.00)
GLUCOSE: 94 mg/dL (ref 70–99)
POTASSIUM: 4.1 meq/L (ref 3.5–5.1)
Sodium: 136 mEq/L (ref 135–145)
Total Bilirubin: 0.7 mg/dL (ref 0.2–1.2)
Total Protein: 7.1 g/dL (ref 6.0–8.3)

## 2018-01-22 LAB — LIPID PANEL
Cholesterol: 181 mg/dL (ref 0–200)
HDL: 59.7 mg/dL (ref >39.00)
LDL Cholesterol: 108 mg/dL — ABNORMAL HIGH (ref 0–99)
NONHDL: 121.16
TRIGLYCERIDES: 67 mg/dL (ref 0.0–149.0)
Total CHOL/HDL Ratio: 3
VLDL: 13.4 mg/dL (ref 0.0–40.0)

## 2018-01-22 LAB — CBC WITH DIFFERENTIAL/PLATELET
Basophils Absolute: 0.1 10*3/uL (ref 0.0–0.1)
Basophils Relative: 1.1 % (ref 0.0–3.0)
EOS PCT: 2 % (ref 0.0–5.0)
Eosinophils Absolute: 0.1 10*3/uL (ref 0.0–0.7)
HCT: 39.2 % (ref 36.0–46.0)
Hemoglobin: 13.3 g/dL (ref 12.0–15.0)
LYMPHS ABS: 2.1 10*3/uL (ref 0.7–4.0)
Lymphocytes Relative: 33.2 % (ref 12.0–46.0)
MCHC: 33.9 g/dL (ref 30.0–36.0)
MCV: 91.5 fl (ref 78.0–100.0)
MONO ABS: 0.6 10*3/uL (ref 0.1–1.0)
MONOS PCT: 9.5 % (ref 3.0–12.0)
NEUTROS ABS: 3.4 10*3/uL (ref 1.4–7.7)
NEUTROS PCT: 54.2 % (ref 43.0–77.0)
PLATELETS: 231 10*3/uL (ref 150.0–400.0)
RBC: 4.28 Mil/uL (ref 3.87–5.11)
RDW: 13.9 % (ref 11.5–15.5)
WBC: 6.3 10*3/uL (ref 4.0–10.5)

## 2018-01-22 LAB — VITAMIN D 25 HYDROXY (VIT D DEFICIENCY, FRACTURES): VITD: 55.98 ng/mL (ref 30.00–100.00)

## 2018-01-22 LAB — HEMOGLOBIN A1C: HEMOGLOBIN A1C: 5.8 % (ref 4.6–6.5)

## 2018-01-22 LAB — TSH: TSH: 2.54 u[IU]/mL (ref 0.35–4.50)

## 2018-01-22 MED ORDER — HYDROCHLOROTHIAZIDE 25 MG PO TABS: ORAL_TABLET | ORAL | 3 refills | Status: DC

## 2018-01-22 NOTE — Assessment & Plan Note (Signed)
dexa 4/18 rev Stable openia Disc need for calcium/ vitamin D/ wt bearing exercise and bone density test every 2 y to monitor Disc safety/ fracture risk in detail   Check D level today  Some kyphosis Had 5 y of evista in the past

## 2018-01-22 NOTE — Assessment & Plan Note (Signed)
Reviewed health habits including diet and exercise and skin cancer prevention Reviewed appropriate screening tests for age  Also reviewed health mt list, fam hx and immunization status , as well as social and family history   See HPI Labs ordered PPSV23 today  Ref for colonoscopy made Will get 3D mammogram in the fall as well as flu shot  Enc further wt loss

## 2018-01-22 NOTE — Assessment & Plan Note (Signed)
Discussed how this problem influences overall health and the risks it imposes  Reviewed plan for weight loss with lower calorie diet (via better food choices and also portion control or program like weight watchers) and exercise building up to or more than 30 minutes 5 days per week including some aerobic activity   Commended on wt loss with weight watchers program so far

## 2018-01-22 NOTE — Assessment & Plan Note (Signed)
bp in fair control at this time  BP Readings from Last 1 Encounters:  01/22/18 122/74   No changes needed Most recent labs reviewed  Disc lifstyle change with low sodium diet and exercise  hctz also helps ankle edema

## 2018-01-22 NOTE — Progress Notes (Signed)
Subjective:    Patient ID: Denise Ali, female    DOB: 1950-11-02, 67 y.o.   MRN: [id number]  HPI Here for health maintenance exam and to review chronic medical problems   Feeling good  Nothing new going on  Not using medicare   Wt Readings from Last 3 Encounters:  01/22/18 197 lb 8 oz (89.6 kg)  11/27/17 208 lb 8 oz (94.6 kg)  07/29/17 207 lb 12 oz (94.2 kg)  doing weight watchers , is reasonable to follow Some exercise- walking / about 20-30 minutes when she does walk  33.64 kg/m   Pneumovax is due (ppsv23)  Will get today   Hep C screening- declined /not high risk   Flu shot -got one in the fall  Tetanus shot 9/14  Colonoscopy 8/14 -due 5 y  Would like referral   Mammogram 10/18 normal/ gets 3D  High risk for breast cancer Self breast exam - no lumps  Will schedule when it is time   dexa 4/18- fairly stable osteopenia in the spine  S/p 5 y of evista in the past  H/o vit D level low in the past -in 20s  Due for labs  No falls  No fractures   zostavax 2016  bp is stable today  No cp or palpitations or headaches or edema  No side effects to medicines  BP Readings from Last 3 Encounters:  01/22/18 122/74  11/27/17 118/68  07/29/17 110/60     History of elevated glucose level Will check A1C today  Diet has been good  Lots of veggies and fruits   Patient Active Problem List   Diagnosis Date Noted  . Colon polyp 01/22/2018  . Right foot injury, subsequent encounter 10/18/2016  . Estrogen deficiency 10/09/2016  . Elevated glucose level 10/09/2016  . Conjunctivitis 05/21/2016  . Obesity 12/24/2014  . Hypertension 10/05/2014  . Breast cancer screening, high risk patient 10/18/2011  . Routine general medical examination at a health care facility 07/02/2011  . Encounter for routine gynecological examination 07/02/2011  . Irregular heart beat 04/09/2011  . HYPOKALEMIA 04/22/2009  . Vitamin D deficiency 02/24/2009  . STRESS REACTION, ACUTE,  WITH EMOTIONAL DISTURBANCE 11/25/2007  . Osteopenia 01/14/2007  . EDEMA 01/14/2007   Past Medical History:  Diagnosis Date  . Bradycardia 9/12   mild - at one time with PACs/ asymptomatic   . Breast cancer screening, high risk patient 10/18/2011  . Colon polyp 12/2007  . Disorder of bone and cartilage, unspecified   . Edema   . Family history of malignant neoplasm of breast    evista for BRCA ppx  . Fibrocystic breast   . 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 Hypopotassemia   . Neoplasm of uncertain behavior of skin   . Other screening mammogram   . Predominant disturbance of emotions   . Routine gynecological examination   . Unspecified vitamin D deficiency    Past Surgical History:  Procedure Laterality Date  . BREAST BIOPSY  01/2010  . BREAST BIOPSY  8/11   Fibrocystic change  . Breast MRI  01/2010   Normal  . cataracts  9/12   bilateral  . DEXA  12/2003   osteopenia  . DEXA  11/07   stable  . DEXA  1/10   Osteopenia-slightly improved  . LEEP     HPV  . procedure for "bad pap"    . TONSILLECTOMY     Social History  Tobacco Use  . Smoking status: Former Smoker    Last attempt to quit: 07/23/1985    Years since quitting: 32.5  . Smokeless tobacco: Never Used  . Tobacco comment: Quit "years ago"  Substance Use Topics  . Alcohol use: Yes    Alcohol/week: 0.0 oz    Comment: Occasional  . Drug use: No   Family History  Problem Relation Age of Onset  . Breast cancer Mother   . Osteoporosis Mother   . Diabetes Father   . Skin cancer Father        ?  Marland Kitchen Osteopenia Sister   . Breast cancer Sister   . Breast cancer Unknown        cousin/Grandmother  . Heart attack Paternal Grandfather   . Colon cancer Neg Hx    Allergies  Allergen Reactions  . Alendronate Sodium Other (See Comments)    REACTION: leg and joint pain   Current Outpatient Medications on File Prior to Visit  Medication Sig Dispense Refill  .  Calcium 1200-1000 MG-UNIT CHEW Chew 1 tablet by mouth daily.      . cholecalciferol (VITAMIN D) 1000 UNITS tablet Take 2,000 Units by mouth daily.      No current facility-administered medications on file prior to visit.      Review of Systems  Constitutional: Negative for activity change, appetite change, fatigue, fever and unexpected weight change.  HENT: Negative for congestion, ear pain, rhinorrhea, sinus pressure and sore throat.   Eyes: Negative for pain, redness and visual disturbance.  Respiratory: Negative for cough, shortness of breath and wheezing.   Cardiovascular: Negative for chest pain and palpitations.  Gastrointestinal: Negative for abdominal pain, blood in stool, constipation and diarrhea.  Endocrine: Negative for polydipsia and polyuria.  Genitourinary: Negative for dysuria, frequency and urgency.  Musculoskeletal: Negative for arthralgias, back pain and myalgias.  Skin: Negative for pallor and rash.  Allergic/Immunologic: Negative for environmental allergies.  Neurological: Negative for dizziness, syncope and headaches.  Hematological: Negative for adenopathy. Does not bruise/bleed easily.  Psychiatric/Behavioral: Negative for decreased concentration and dysphoric mood. The patient is not nervous/anxious.        Objective:   Physical Exam  Constitutional: She appears well-developed and well-nourished. No distress.  obese and well appearing   HENT:  Head: Normocephalic and atraumatic.  Right Ear: External ear normal.  Left Ear: External ear normal.  Mouth/Throat: Oropharynx is clear and moist.  Eyes: Pupils are equal, round, and reactive to light. Conjunctivae and EOM are normal. No scleral icterus.  Neck: Normal range of motion. Neck supple. No JVD present. Carotid bruit is not present. No thyromegaly present.  Cardiovascular: Normal rate, regular rhythm, normal heart sounds and intact distal pulses. Exam reveals no gallop.  Pulmonary/Chest: Effort normal and  breath sounds normal. No respiratory distress. She has no wheezes. She exhibits no tenderness. No breast tenderness, discharge or bleeding.  Abdominal: Soft. Bowel sounds are normal. She exhibits no distension, no abdominal bruit and no mass. There is no tenderness.  Genitourinary: No breast tenderness, discharge or bleeding.  Genitourinary Comments: Breast exam: No mass, nodules, thickening, tenderness, bulging, retraction, inflamation, nipple discharge or skin changes noted.  No axillary or clavicular LA.      Musculoskeletal: Normal range of motion. She exhibits no edema or tenderness.  Lymphadenopathy:    She has no cervical adenopathy.  Neurological: She is alert. She has normal reflexes. She displays normal reflexes. No cranial nerve deficit. She exhibits normal muscle tone. Coordination normal.  Skin: Skin is warm and dry. No rash noted. No erythema. No pallor.  Solar lentigines diffusely  Some sks   Psychiatric: She has a normal mood and affect.  Pleasant           Assessment & Plan:   Problem List Items Addressed This Visit      Cardiovascular and Mediastinum   Hypertension    bp in fair control at this time  BP Readings from Last 1 Encounters:  01/22/18 122/74   No changes needed Most recent labs reviewed  Disc lifstyle change with low sodium diet and exercise  hctz also helps ankle edema       Relevant Medications   hydrochlorothiazide (HYDRODIURIL) 25 MG tablet   Other Relevant Orders   CBC with Differential/Platelet (Completed)   Comprehensive metabolic panel (Completed)   Lipid panel (Completed)   TSH (Completed)     Digestive   Colon polyp    Due for recall colonoscopy in aug  Ref made      Relevant Orders   Ambulatory referral to Gastroenterology     Musculoskeletal and Integument   Osteopenia    dexa 4/18 rev Stable openia Disc need for calcium/ vitamin D/ wt bearing exercise and bone density test every 2 y to monitor Disc safety/ fracture  risk in detail   Check D level today  Some kyphosis Had 5 y of evista in the past        Other   Elevated glucose level    A1C today  Commended wt loss so far  disc imp of low glycemic diet and wt loss to prevent DM2       Relevant Orders   Hemoglobin A1c (Completed)   Obesity    Discussed how this problem influences overall health and the risks it imposes  Reviewed plan for weight loss with lower calorie diet (via better food choices and also portion control or program like weight watchers) and exercise building up to or more than 30 minutes 5 days per week including some aerobic activity   Commended on wt loss with weight watchers program so far      Routine general medical examination at a health care facility - Primary    Reviewed health habits including diet and exercise and skin cancer prevention Reviewed appropriate screening tests for age  Also reviewed health mt list, fam hx and immunization status , as well as social and family history   See HPI Labs ordered PPSV23 today  Ref for colonoscopy made Will get 3D mammogram in the fall as well as flu shot  Enc further wt loss       Vitamin D deficiency    Level today  Known osteopenia Disc imp to bone and overall health      Relevant Orders   VITAMIN D 25 Hydroxy (Vit-D Deficiency, Fractures) (Completed)    Other Visit Diagnoses    Need for 23-polyvalent pneumococcal polysaccharide vaccine       Relevant Orders   Pneumococcal polysaccharide vaccine 23-valent greater than or equal to 2yo subcutaneous/IM (Completed)

## 2018-01-22 NOTE — Assessment & Plan Note (Signed)
Level today  Known osteopenia Disc imp to bone and overall health

## 2018-01-22 NOTE — Assessment & Plan Note (Signed)
Due for recall colonoscopy in aug  Ref made

## 2018-01-22 NOTE — Assessment & Plan Note (Signed)
A1C today  Commended wt loss so far  disc imp of low glycemic diet and wt loss to prevent DM2

## 2018-01-22 NOTE — Patient Instructions (Signed)
Pneumonia vaccine today   Referral for colonoscopy   Labs today   Keep up the good work with diet and add more exercise when you can   Take care of yourself  Get your mammogram in the fall as planned

## 2018-01-24 ENCOUNTER — Encounter: Payer: Self-pay | Admitting: *Deleted

## 2018-03-27 ENCOUNTER — Ambulatory Visit (AMBULATORY_SURGERY_CENTER): Payer: Self-pay | Admitting: *Deleted

## 2018-03-27 VITALS — Ht 64.25 in | Wt 192.8 lb

## 2018-03-27 DIAGNOSIS — Z86010 Personal history of colonic polyps: Secondary | ICD-10-CM

## 2018-03-27 MED ORDER — NA SULFATE-K SULFATE-MG SULF 17.5-3.13-1.6 GM/177ML PO SOLN: 1.0000 | Freq: Once | ORAL | 0 refills | Status: AC

## 2018-03-27 NOTE — Progress Notes (Signed)
Denies allergies to eggs or soy products. Denies complications with sedation or anesthesia. Denies O2 use. Denies use of diet or weight loss medications.  Emmi instructions given for colonoscopy.  

## 2018-04-11 ENCOUNTER — Encounter: Payer: Self-pay | Admitting: Gastroenterology

## 2018-04-11 ENCOUNTER — Ambulatory Visit (AMBULATORY_SURGERY_CENTER): Payer: 59 | Admitting: Gastroenterology

## 2018-04-11 VITALS — BP 118/60 | HR 49 | Temp 97.7°F | Resp 19 | Ht 64.5 in | Wt 197.0 lb

## 2018-04-11 DIAGNOSIS — D123 Benign neoplasm of transverse colon: Secondary | ICD-10-CM

## 2018-04-11 DIAGNOSIS — Z86010 Personal history of colonic polyps: Secondary | ICD-10-CM | POA: Diagnosis not present

## 2018-04-11 DIAGNOSIS — D124 Benign neoplasm of descending colon: Secondary | ICD-10-CM

## 2018-04-11 MED ORDER — SODIUM CHLORIDE 0.9 % IV SOLN: 500.0000 mL | Freq: Once | INTRAVENOUS | Status: DC

## 2018-04-11 NOTE — Op Note (Signed)
[name] Name: Denise Ali Procedure Date: 04/11/2018 8:25 AM MRN: [id number] Endoscopist: Milus Banister , MD Age: 67 Referring MD:  Date of Birth: 1951-01-25 Gender: Female Account #: [id number] Procedure:                Colonoscopy Indications:              High risk colon cancer surveillance: Personal                            history of colonic polyps; colonoscopy 2009 48mm                            adenoma; colonoscopy 2014 two subCM polyps, one was                            an adenoma Medicines:                Monitored Anesthesia Care Procedure:                Pre-Anesthesia Assessment:                           - Prior to the procedure, a History and Physical                            was performed, and patient medications and                            allergies were reviewed. The patient's tolerance of                            previous anesthesia was also reviewed. The risks                            and benefits of the procedure and the sedation                            options and risks were discussed with the patient.                            All questions were answered, and informed consent                            was obtained. Prior Anticoagulants: The patient has                            taken no previous anticoagulant or antiplatelet                            agents. ASA Grade Assessment: II - A patient with                            mild systemic disease. After reviewing the risks  and benefits, the patient was deemed in                            satisfactory condition to undergo the procedure.                           After obtaining informed consent, the colonoscope                            was passed under direct vision. Throughout the                            procedure, the patient's blood pressure, pulse, and                            oxygen saturations were monitored continuously.  The                            Colonoscope was introduced through the anus and                            advanced to the the cecum, identified by                            appendiceal orifice and ileocecal valve. The                            colonoscopy was performed without difficulty. The                            patient tolerated the procedure well. The quality                            of the bowel preparation was good. The ileocecal                            valve, appendiceal orifice, and rectum were                            photographed. Scope In: 8:30:17 AM Scope Out: 8:48:49 AM Scope Withdrawal Time: 0 hours 13 minutes 30 seconds  Total Procedure Duration: 0 hours 18 minutes 32 seconds  Findings:                 Four sessile polyps were found in the descending                            colon and transverse colon. The polyps were 3 to 7                            mm in size. These polyps were removed with a cold                            snare. Resection and retrieval were complete.  The exam was otherwise without abnormality on                            direct and retroflexion views. Complications:            No immediate complications. Estimated blood loss:                            None. Estimated Blood Loss:     Estimated blood loss: none. Impression:               - Four 3 to 7 mm polyps in the descending colon and                            in the transverse colon, removed with a cold snare.                            Resected and retrieved.                           - The examination was otherwise normal on direct                            and retroflexion views. Recommendation:           - Patient has a contact number available for                            emergencies. The signs and symptoms of potential                            delayed complications were discussed with the                            patient. Return to normal  activities tomorrow.                            Written discharge instructions were provided to the                            patient.                           - Resume previous diet.                           - Continue present medications.                           You will receive a letter within 2-3 weeks with the                            pathology results and my final recommendations.                           If the polyp(s) is proven to be 'pre-cancerous' on  pathology, you will need repeat colonoscopy in 3-5                            years. If the polyp(s) is NOT 'precancerous' on                            pathology then you should repeat colon cancer                            screening in 10 years with colonoscopy without need                            for colon cancer screening by any method prior to                            then (including stool testing). Milus Banister, MD 04/11/2018 8:52:09 AM This report has been signed electronically.

## 2018-04-11 NOTE — Patient Instructions (Signed)
*   handout on polyps given *  YOU HAD AN ENDOSCOPIC PROCEDURE TODAY AT THE [facility]:   Refer to the procedure report that was given to you for any specific questions about what was found during the examination.  If the procedure report does not answer your questions, please call your gastroenterologist to clarify.  If you requested that your care partner not be given the details of your procedure findings, then the procedure report has been included in a sealed envelope for you to review at your convenience later.  YOU SHOULD EXPECT: Some feelings of bloating in the abdomen. Passage of more gas than usual.  Walking can help get rid of the air that was put into your GI tract during the procedure and reduce the bloating. If you had a lower endoscopy (such as a colonoscopy or flexible sigmoidoscopy) you may notice spotting of blood in your stool or on the toilet paper. If you underwent a bowel prep for your procedure, you may not have a normal bowel movement for a few days.  Please Note:  You might notice some irritation and congestion in your nose or some drainage.  This is from the oxygen used during your procedure.  There is no need for concern and it should clear up in a day or so.  SYMPTOMS TO REPORT IMMEDIATELY:   Following lower endoscopy (colonoscopy or flexible sigmoidoscopy):  Excessive amounts of blood in the stool  Significant tenderness or worsening of abdominal pains  Swelling of the abdomen that is new, acute  Fever of 100F or higher   For urgent or emergent issues, a gastroenterologist can be reached at any hour by calling [phone].   DIET:  We do recommend a small meal at first, but then you may proceed to your regular diet.  Drink plenty of fluids but you should avoid alcoholic beverages for 24 hours.  ACTIVITY:  You should plan to take it easy for the rest of today and you should NOT DRIVE or use heavy machinery until tomorrow (because of the sedation  medicines used during the test).    FOLLOW UP: Our staff will call the number listed on your records the next business day following your procedure to check on you and address any questions or concerns that you may have regarding the information given to you following your procedure. If we do not reach you, we will leave a message.  However, if you are feeling well and you are not experiencing any problems, there is no need to return our call.  We will assume that you have returned to your regular daily activities without incident.  If any biopsies were taken you will be contacted by phone or by letter within the next 1-3 weeks.  Please call us at [phone] if you have not heard about the biopsies in 3 weeks.    SIGNATURES/CONFIDENTIALITY: You and/or your care partner have signed paperwork which will be entered into your electronic medical record.  These signatures attest to the fact that that the information above on your After Visit Summary has been reviewed and is understood.  Full responsibility of the confidentiality of this discharge information lies with you and/or your care-partner. 

## 2018-04-11 NOTE — Progress Notes (Signed)
Report to PACU, RN, vss, BBS= Clear.  

## 2018-04-11 NOTE — Progress Notes (Signed)
Called to room to assist during endoscopic procedure.  Patient ID and intended procedure confirmed with present staff. Received instructions for my participation in the procedure from the performing physician.  

## 2018-04-11 NOTE — Addendum Note (Signed)
Addended by: Dustin Flock T on: 04/11/2018 02:42 PM   Modules accepted: Orders

## 2018-04-14 ENCOUNTER — Telehealth: Payer: Self-pay

## 2018-04-14 NOTE — Telephone Encounter (Signed)
  Follow up Call-  Call back number 04/11/2018  Post procedure Call Back phone  # [phone]  Permission to leave phone message Yes  Some recent data might be hidden     Patient questions:  Do you have a fever, pain , or abdominal swelling? No. Pain Score  0 *  Have you tolerated food without any problems? Yes.    Have you been able to return to your normal activities? Yes.    Do you have any questions about your discharge instructions: Diet   No. Medications  No. Follow up visit  No.  Do you have questions or concerns about your Care? No.  Actions: * If pain score is 4 or above: No action needed, pain <4.  Per pt no problems noted. maw

## 2018-04-18 ENCOUNTER — Encounter: Payer: Self-pay | Admitting: Gastroenterology

## 2018-05-26 ENCOUNTER — Encounter: Payer: Self-pay | Admitting: Family Medicine

## 2018-08-19 ENCOUNTER — Ambulatory Visit (INDEPENDENT_AMBULATORY_CARE_PROVIDER_SITE_OTHER)
Admission: RE | Admit: 2018-08-19 | Discharge: 2018-08-19 | Disposition: A | Discharge status: Home / Self Care | Payer: 59 | Source: Ambulatory Visit | Attending: Family Medicine | Admitting: Family Medicine

## 2018-08-19 ENCOUNTER — Encounter: Payer: Self-pay | Admitting: Family Medicine

## 2018-08-19 ENCOUNTER — Ambulatory Visit (INDEPENDENT_AMBULATORY_CARE_PROVIDER_SITE_OTHER): Payer: 59 | Admitting: Family Medicine

## 2018-08-19 VITALS — BP 130/72 | HR 66 | Temp 98.3°F | Ht 64.25 in

## 2018-08-19 DIAGNOSIS — M25572 Pain in left ankle and joints of left foot: Secondary | ICD-10-CM | POA: Diagnosis not present

## 2018-08-19 DIAGNOSIS — L84 Corns and callosities: Secondary | ICD-10-CM | POA: Diagnosis not present

## 2018-08-19 NOTE — Assessment & Plan Note (Addendum)
Since injury in December  Mostly medial with mild swelling  Xray today-plan to follow (r/o fracture or calcium over achilles)  Recommend ice and elevation when able  nsaid prn

## 2018-08-19 NOTE — Patient Instructions (Signed)
Wear the most supportive shoe you can   Try some ice when you have pain  Aleve is ok (with food)   We will check xray -ankle and foot  Then we will make a plan   I debrided the callus on your toe You can soak and use pumice or foot file to get rid of more dead skin  If possible - use a pad to protect (or wear wider or taller shoe)   Continue the recumbent exercise bike

## 2018-08-19 NOTE — Assessment & Plan Note (Signed)
Lateral left 5th toe  Debrided with sterile scalpel  Disc use of pads  Consider podiatry if needed  Disc imp of shoe that is wide enough with deep toe box

## 2018-08-19 NOTE — Progress Notes (Signed)
Subjective:    Patient ID: Denise Ali, female    DOB: 07/19/1951, 68 y.o.   MRN: [id number]  HPI  Here for foot pain while walking L  Wt Readings from Last 3 Encounters:  04/11/18 197 lb (89.4 kg)  03/27/18 192 lb 12.8 oz (87.5 kg)  01/22/18 197 lb 8 oz (89.6 kg)   33.55 kg/m   Xmas eve she stepped on a dog toy - almost did a split (did not fall)  Was wearing flip flops  Thinks she twisted her ankle as well  No bruising  Next few days- ok  For past 3 weeks that same foot has been painful   Has varicose veins   Some pain in medial ankle/achilles Shoots pain up and over top of foot  Worse with walking / putting weight on it  Also prolonged standing  occ swells up   Other foot is fine   Tender behind medial malleolus  More pain to dorsiflex than plantar flex   Also has a corn/callus on lateral L 5th toe - affects her gait   Wears flip flops in summer  They hurt less than shoes  Does wear crocks   Has not tried heat or ice Has taken aleve - helps only a little  Tries to put her feet up when at home and sitting   Exercise - at the gym /stationary bike  Recumbent bike does not hurt it   Patient Active Problem List   Diagnosis Date Noted  . Pain in joint, ankle and foot, left 08/19/2018  . Callus 08/19/2018  . Colon polyp 01/22/2018  . Right foot injury, subsequent encounter 10/18/2016  . Estrogen deficiency 10/09/2016  . Elevated glucose level 10/09/2016  . Conjunctivitis 05/21/2016  . Obesity 12/24/2014  . Hypertension 10/05/2014  . Breast cancer screening, high risk patient 10/18/2011  . Routine general medical examination at a health care facility 07/02/2011  . Encounter for routine gynecological examination 07/02/2011  . Irregular heart beat 04/09/2011  . HYPOKALEMIA 04/22/2009  . Vitamin D deficiency 02/24/2009  . STRESS REACTION, ACUTE, WITH EMOTIONAL DISTURBANCE 11/25/2007  . Osteopenia 01/14/2007  . EDEMA 01/14/2007   Past Medical  History:  Diagnosis Date  . Anemia   . Arthritis   . Bradycardia 9/12   mild - at one time with PACs/ asymptomatic   . Breast cancer screening, high risk patient 10/18/2011  . Cataracts, bilateral   . Colon polyp 12/2007  . Disorder of bone and cartilage, unspecified   . Edema   . Family history of malignant neoplasm of breast    evista for BRCA ppx  . Fibrocystic breast   . 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 Hypertension   . Hypopotassemia   . Neoplasm of uncertain behavior of skin   . Osteopenia   . Other screening mammogram   . Predominant disturbance of emotions   . Routine gynecological examination   . Seasonal allergies   . Unspecified vitamin D deficiency    Past Surgical History:  Procedure Laterality Date  . BREAST BIOPSY  01/2010  . BREAST BIOPSY  8/11   Fibrocystic change  . Breast MRI  01/2010   Normal  . cataracts  9/12   bilateral  . DEXA  12/2003   osteopenia  . DEXA  11/07   stable  . DEXA  1/10   Osteopenia-slightly improved  . LEEP     HPV  . procedure for "  bad pap"    . TONSILLECTOMY     Social History   Tobacco Use  . Smoking status: Former Smoker    Last attempt to quit: 07/23/1985    Years since quitting: 33.0  . Smokeless tobacco: Never Used  . Tobacco comment: Quit "years ago"  Substance Use Topics  . Alcohol use: Yes    Alcohol/week: 0.0 standard drinks    Comment: Occasional  . Drug use: No   Family History  Problem Relation Age of Onset  . Breast cancer Mother   . Osteoporosis Mother   . Diabetes Father   . Skin cancer Father        ?  Marland Kitchen Osteopenia Sister   . Breast cancer Sister   . Breast cancer Unknown        cousin/Grandmother  . Heart attack Paternal Grandfather   . Colon cancer Neg Hx   . Esophageal cancer Neg Hx   . Rectal cancer Neg Hx   . Stomach cancer Neg Hx    Allergies  Allergen Reactions  . Alendronate Sodium Other (See Comments)    REACTION: leg and joint  pain   Current Outpatient Medications on File Prior to Visit  Medication Sig Dispense Refill  . Calcium 1200-1000 MG-UNIT CHEW Chew 1 tablet by mouth daily.      . cholecalciferol (VITAMIN D) 1000 UNITS tablet Take 2,000 Units by mouth daily.     . hydrochlorothiazide (HYDRODIURIL) 25 MG tablet TAKE 1 TABLET (25 MG TOTAL) BY MOUTH DAILY. 90 tablet 3  . naproxen sodium (ALEVE) 220 MG tablet Take 220 mg by mouth.     No current facility-administered medications on file prior to visit.       Review of Systems  Constitutional: Negative for activity change, appetite change, fatigue, fever and unexpected weight change.  HENT: Negative for congestion, ear pain, rhinorrhea, sinus pressure and sore throat.   Eyes: Negative for pain, redness and visual disturbance.  Respiratory: Negative for cough, shortness of breath and wheezing.   Cardiovascular: Negative for chest pain and palpitations.  Gastrointestinal: Negative for abdominal pain, blood in stool, constipation and diarrhea.  Endocrine: Negative for polydipsia and polyuria.  Genitourinary: Negative for dysuria, frequency and urgency.  Musculoskeletal: Positive for gait problem. Negative for arthralgias, back pain and myalgias.       Pain in L foot and ankle affecting gait    Skin: Negative for pallor and rash.       Callus on L pinkie toe   Allergic/Immunologic: Negative for environmental allergies.  Neurological: Negative for dizziness, syncope and headaches.  Hematological: Negative for adenopathy. Does not bruise/bleed easily.  Psychiatric/Behavioral: Negative for decreased concentration and dysphoric mood. The patient is not nervous/anxious.        Objective:   Physical Exam Constitutional:      General: She is not in acute distress.    Appearance: Normal appearance. She is obese.  HENT:     Head: Normocephalic and atraumatic.  Cardiovascular:     Rate and Rhythm: Normal rate and regular rhythm.     Pulses: Normal pulses.    Pulmonary:     Effort: Pulmonary effort is normal.  Musculoskeletal:        General: Swelling present.     Left ankle: She exhibits swelling. She exhibits normal range of motion, no ecchymosis, no laceration and normal pulse. Tenderness. Medial malleolus and posterior TFL tenderness found. No head of 5th metatarsal and no proximal fibula tenderness found. Achilles tendon  exhibits pain. Achilles tendon exhibits no defect and normal Thompson's test results.     Left foot: Normal range of motion. Tenderness, bony tenderness and swelling present. No crepitus or deformity.     Comments: L ankle-tender medially (posterior to malleolus mostly) with mild swelling  Tender over distal achilles w/o deformity Nl rom (discomfort with full dorsiflexion)  Neg talar tilt   L foot  Mild medial and dorsal tenderness  No ecchymosis  Nl perf and sens   Skin:    General: Skin is warm and dry.     Findings: No erythema or rash.     Comments: Callus on L lateral 5th toe Debrided gently today  Neurological:     Mental Status: She is alert.           Assessment & Plan:   Problem List Items Addressed This Visit      Other   Pain in joint, ankle and foot, left - Primary    Since injury in December  Mostly medial with mild swelling  Xray today-plan to follow (r/o fracture or calcium over achilles)  Recommend ice and elevation when able  nsaid prn        Relevant Orders   DG Foot Complete Left (Completed)   DG Ankle Complete Left (Completed)   Callus    Lateral left 5th toe  Debrided with sterile scalpel  Disc use of pads  Consider podiatry if needed  Disc imp of shoe that is wide enough with deep toe box

## 2018-08-21 ENCOUNTER — Encounter: Payer: Self-pay | Admitting: Family Medicine

## 2018-08-21 ENCOUNTER — Ambulatory Visit (INDEPENDENT_AMBULATORY_CARE_PROVIDER_SITE_OTHER): Payer: 59 | Admitting: Family Medicine

## 2018-08-21 VITALS — BP 110/60 | HR 65 | Temp 98.0°F | Ht 64.25 in | Wt 208.8 lb

## 2018-08-21 DIAGNOSIS — M79672 Pain in left foot: Secondary | ICD-10-CM | POA: Diagnosis not present

## 2018-08-21 DIAGNOSIS — M25572 Pain in left ankle and joints of left foot: Secondary | ICD-10-CM

## 2018-08-21 NOTE — Progress Notes (Signed)
Dr. Frederico Hamman T. Alanna Storti, MD, [facility] [place], [address] Phone: [phone] Fax: [phone]  08/21/2018  Patient: Denise Ali, MRN: [id number], DOB: 1951-06-28, 68 y.o.  Primary Physician:  Tower, Wynelle Fanny, MD   Chief Complaint  Patient presents with  . Foot/Ankle Pain    Left-seen by Dr. Glori Bickers 08/19/2018   Subjective:   Denise Ali is a 68 y.o. very pleasant female patient who presents with the following:  L sided: Hurts almost all of the time.  The only thing that she can think about was the she stepped on a dog bone Hurt when it happened. For about the past 3 weeks, it hurts when it walks, hurts with sitting.  Variability.  Has been able to walk.   She is a very nice lady.  She saw my partner Dr. Glori Bickers 2 days ago.  At that time she did have some plain foot and ankle x-rays, and there was no clear fracture.  She does have some midfoot degenerative changes most likely with no clear acute fracture lines or fragments.  Independently reviewed by myself.  The only clear injury that she can recall is on Christmas Eve she stepped on a dog bone and in someway her lips came apart, and she thinks is her foot came up underneath her.  She did not have any bruising or swelling, but she has been having some persistent pain with ambulation and pain relatively diffusely in the foot and ankle since that time.  She does have some pain and mild swelling medially.  She is also having some pain in the midfoot.  PT injury Midfoot sprain Talus injury and pain  Past Medical History, Surgical History, Social History, Family History, Problem List, Medications, and Allergies have been reviewed and updated if relevant.  Patient Active Problem List   Diagnosis Date Noted  . Pain in joint, ankle and foot, left 08/19/2018  . Callus 08/19/2018  . Colon polyp 01/22/2018  . Estrogen deficiency 10/09/2016  . Elevated glucose  level 10/09/2016  . Obesity 12/24/2014  . Hypertension 10/05/2014  . Breast cancer screening, high risk patient 10/18/2011  . Routine general medical examination at a health care facility 07/02/2011  . Encounter for routine gynecological examination 07/02/2011  . Irregular heart beat 04/09/2011  . HYPOKALEMIA 04/22/2009  . Vitamin D deficiency 02/24/2009  . STRESS REACTION, ACUTE, WITH EMOTIONAL DISTURBANCE 11/25/2007  . Osteopenia 01/14/2007  . EDEMA 01/14/2007    Past Medical History:  Diagnosis Date  . Anemia   . Arthritis   . Bradycardia 9/12   mild - at one time with PACs/ asymptomatic   . Breast cancer screening, high risk patient 10/18/2011  . Cataracts, bilateral   . Colon polyp 12/2007  . Disorder of bone and cartilage, unspecified   . Edema   . Family history of malignant neoplasm of breast    evista for BRCA ppx  . Fibrocystic breast   . 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 Hypertension   . Hypopotassemia   . Neoplasm of uncertain behavior of skin   . Osteopenia   . Other screening mammogram   . Predominant disturbance of emotions   . Routine gynecological examination   . Seasonal allergies   . Unspecified vitamin D deficiency     Past Surgical History:  Procedure Laterality Date  . BREAST BIOPSY  01/2010  . BREAST BIOPSY  8/11   Fibrocystic change  . Breast MRI  01/2010   Normal  . cataracts  9/12   bilateral  . DEXA  12/2003   osteopenia  . DEXA  11/07   stable  . DEXA  1/10   Osteopenia-slightly improved  . LEEP     HPV  . procedure for "bad pap"    . TONSILLECTOMY      Social History   Socioeconomic History  . Marital status: Divorced    Spouse name: Not on file  . Number of children: 1  . Years of education: Not on file  . Highest education level: Not on file  Occupational History  . Occupation: Social Writer at [employer]  Social Needs  . Financial resource strain: Not on file  .  Food insecurity:    Worry: Not on file    Inability: Not on file  . Transportation needs:    Medical: Not on file    Non-medical: Not on file  Tobacco Use  . Smoking status: Former Smoker    Last attempt to quit: 07/23/1985    Years since quitting: 33.1  . Smokeless tobacco: Never Used  . Tobacco comment: Quit "years ago"  Substance and Sexual Activity  . Alcohol use: Yes    Alcohol/week: 0.0 standard drinks    Comment: Occasional  . Drug use: No  . Sexual activity: Yes  Lifestyle  . Physical activity:    Days per week: Not on file    Minutes per session: Not on file  . Stress: Not on file  Relationships  . Social connections:    Talks on phone: Not on file    Gets together: Not on file    Attends religious service: Not on file    Active member of club or organization: Not on file    Attends meetings of clubs or organizations: Not on file    Relationship status: Not on file  . Intimate partner violence:    Fear of current or ex partner: Not on file    Emotionally abused: Not on file    Physically abused: Not on file    Forced sexual activity: Not on file  Other Topics Concern  . Not on file  Social History Narrative   Works very long hours      Quit smoking year ago             Family History  Problem Relation Age of Onset  . Breast cancer Mother   . Osteoporosis Mother   . Diabetes Father   . Skin cancer Father        ?  Marland Kitchen Osteopenia Sister   . Breast cancer Sister   . Breast cancer Unknown        cousin/Grandmother  . Heart attack Paternal Grandfather   . Colon cancer Neg Hx   . Esophageal cancer Neg Hx   . Rectal cancer Neg Hx   . Stomach cancer Neg Hx     Allergies  Allergen Reactions  . Alendronate Sodium Other (See Comments)    REACTION: leg and joint pain    Medication list reviewed and updated in full in [place].  GEN: No fevers, chills. Nontoxic. Primarily MSK c/o today. MSK: Detailed in the HPI GI: tolerating PO intake  without difficulty Neuro: No numbness, parasthesias, or tingling associated. Otherwise the pertinent positives of the ROS are noted above.   Objective:   BP 110/60   Pulse 65  Temp 98 F (36.7 C) (Oral)   Ht 5' 4.25" (1.632 m)   Wt 208 lb 12 oz (94.7 kg)   SpO2 91%   BMI 35.55 kg/m    GEN: WDWN, NAD, Non-toxic, Alert & Oriented x 3 HEENT: Atraumatic, Normocephalic.  Ears and Nose: No external deformity. EXTR: No clubbing/cyanosis/edema NEURO: Normal gait. Mild antalgia PSYCH: Normally interactive. Conversant. Not depressed or anxious appearing.  Calm demeanor.    On the left side there is some mild tenderness in the distal tibia.  No tenderness at the medial or lateral malleolus.  There is some notable tenderness along the course of the posterior tibialis tendon with some mild swelling.  No tenderness at the Achilles tendon.  The calcaneus is nontender and the calcaneal squeeze is nontender.  Mildly tender at the navicular.  Tender relatively diffusely in the midfoot.  Patient's talus is moderately tender to palpation.  Nontender at the fifth metatarsal and along all of the metatarsal shafts.  Nontender at all toes.  Radiology: Dg Ankle Complete Left  Result Date: 08/19/2018 CLINICAL DATA:  Left foot and ankle pain.  No known injury. EXAM: LEFT ANKLE COMPLETE - 3+ VIEW COMPARISON:  None. FINDINGS: Soft tissues about the medial aspect of the lower leg appear edematous. No radiopaque foreign body or soft tissue gas is seen. No acute bony or joint abnormality. Plantar calcaneal spur noted. IMPRESSION: Soft tissues about the medial aspect of the lower leg appear edematous which could be due to dependent change or cellulitis. No acute bony or joint abnormality. Plantar calcaneal spur. Electronically Signed   By: Inge Rise M.D.   On: 08/19/2018 15:32   Dg Foot Complete Left  Result Date: 08/19/2018 CLINICAL DATA:  Left foot and ankle pain. No known injury. Initial encounter.  EXAM: LEFT FOOT - COMPLETE 3+ VIEW COMPARISON:  None. FINDINGS: There is no evidence of fracture or dislocation. Mild osteophytosis about the midfoot noted. Plantar calcaneal spur noted. Soft tissues are unremarkable. IMPRESSION: No acute abnormality. Plantar calcaneal spur. Mild midfoot degenerative disease. Electronically Signed   By: Inge Rise M.D.   On: 08/19/2018 15:31    Assessment and Plan:   Acute left ankle pain  Acute foot pain, left  Level of Medical Decision-Making in this case is Moderate.   Multiple ongoing problems.  Suspect posterior tibialis injury along with talus injury without fracture.  Likely bony edema is present at the talus.  Possible on multiple other locations including the distal tibia as well as some combination in the midfoot.  Midfoot sprain concomitant as well.  Multiple locations.  We discussed treatment options.  For definitive diagnosis MRI of the foot and ankle could be done.  Given financial implications, the patient and I have decided that conservative nonoperative management with good follow-up is reasonable to do in this case, and I am in a place her in a tall pneumatic fracture boot for 4 to 6 weeks.  Follow-up: Return in about 4 weeks (around 09/18/2018).  Signed,  Maud Deed. Ruthanne Mcneish, MD   Outpatient Encounter Medications as of 08/21/2018  Medication Sig  . Calcium 1200-1000 MG-UNIT CHEW Chew 1 tablet by mouth daily.    . cholecalciferol (VITAMIN D) 1000 UNITS tablet Take 2,000 Units by mouth daily.   . hydrochlorothiazide (HYDRODIURIL) 25 MG tablet TAKE 1 TABLET (25 MG TOTAL) BY MOUTH DAILY.  . naproxen sodium (ALEVE) 220 MG tablet Take 220 mg by mouth.   No facility-administered encounter medications on file as of 08/21/2018.

## 2019-03-18 ENCOUNTER — Telehealth: Payer: Self-pay | Admitting: *Deleted

## 2019-03-18 NOTE — Telephone Encounter (Signed)
Please schedule PE when able and refill until then  

## 2019-03-18 NOTE — Telephone Encounter (Signed)
No recent or future F/u or CPE please advise   [facility]

## 2019-03-19 MED ORDER — HYDROCHLOROTHIAZIDE 25 MG PO TABS: ORAL_TABLET | ORAL | 0 refills | Status: DC

## 2019-03-19 NOTE — Telephone Encounter (Signed)
Med refilled once and Carrie will reach out to pt and try and get appt scheduled  

## 2019-03-19 NOTE — Telephone Encounter (Signed)
Patient scheduled the next available cpx on 05/12/19 with labs the week before.  Please refill medication until her appointment.

## 2019-03-19 NOTE — Telephone Encounter (Signed)
[facility] Night - Client Nonclinical Telephone Record AccessNurse Client [facility] Night - Client Client Site [place] Physician Loura Pardon - MD Contact Type Call Who Is Calling Patient / Member / Family / Caregiver Caller Name [place] Phone Number [phone] Patient Name Denise Ali Patient DOB [birth year] Call Type Message Only Information Provided Reason for Call Request for General Office Information Initial Comment Caller states that [facility] not [facility] and the doctor needs to approve it. She is wanting it approved tomorrow. Medication is for her blood pressure 90 day supply, she has 6 left. Additional Comment Provided hours Call Closed By: Juline Patch Transaction Date/Time: 03/18/2019 6:23:35 PM (ET)

## 2019-05-03 ENCOUNTER — Telehealth: Payer: Self-pay | Admitting: Family Medicine

## 2019-05-03 DIAGNOSIS — E559 Vitamin D deficiency, unspecified: Secondary | ICD-10-CM

## 2019-05-03 DIAGNOSIS — I10 Essential (primary) hypertension: Secondary | ICD-10-CM

## 2019-05-03 DIAGNOSIS — R7309 Other abnormal glucose: Secondary | ICD-10-CM

## 2019-05-03 NOTE — Telephone Encounter (Signed)
-----   Message from Ellamae Sia sent at 04/27/2019  2:19 PM EDT ----- Regarding: Lab orders for Tuesday, 10.13.20 Patient is scheduled for CPX labs, please order future labs, Thanks , Karna Christmas

## 2019-05-05 ENCOUNTER — Other Ambulatory Visit (INDEPENDENT_AMBULATORY_CARE_PROVIDER_SITE_OTHER): Payer: 59

## 2019-05-05 ENCOUNTER — Other Ambulatory Visit: Payer: Self-pay

## 2019-05-05 DIAGNOSIS — I10 Essential (primary) hypertension: Secondary | ICD-10-CM | POA: Diagnosis not present

## 2019-05-05 DIAGNOSIS — R7309 Other abnormal glucose: Secondary | ICD-10-CM

## 2019-05-05 DIAGNOSIS — E559 Vitamin D deficiency, unspecified: Secondary | ICD-10-CM | POA: Diagnosis not present

## 2019-05-05 LAB — COMPREHENSIVE METABOLIC PANEL
ALT: 14 U/L (ref 0–35)
AST: 15 U/L (ref 0–37)
Albumin: 3.9 g/dL (ref 3.5–5.2)
Alkaline Phosphatase: 58 U/L (ref 39–117)
BUN: 20 mg/dL (ref 6–23)
CO2: 29 mEq/L (ref 19–32)
Calcium: 10.6 mg/dL — ABNORMAL HIGH (ref 8.4–10.5)
Chloride: 103 mEq/L (ref 96–112)
Creatinine, Ser: 0.69 mg/dL (ref 0.40–1.20)
GFR: 84.66 mL/min (ref >60.00)
Glucose, Bld: 109 mg/dL — ABNORMAL HIGH (ref 70–99)
Potassium: 4.3 mEq/L (ref 3.5–5.1)
Sodium: 139 mEq/L (ref 135–145)
Total Bilirubin: 0.8 mg/dL (ref 0.2–1.2)
Total Protein: 6.9 g/dL (ref 6.0–8.3)

## 2019-05-05 LAB — LIPID PANEL
Cholesterol: 175 mg/dL (ref 0–200)
HDL: 58.9 mg/dL (ref >39.00)
LDL Cholesterol: 100 mg/dL — ABNORMAL HIGH (ref 0–99)
NonHDL: 116.48
Total CHOL/HDL Ratio: 3
Triglycerides: 83 mg/dL (ref 0.0–149.0)
VLDL: 16.6 mg/dL (ref 0.0–40.0)

## 2019-05-05 LAB — CBC WITH DIFFERENTIAL/PLATELET
Basophils Absolute: 0 10*3/uL (ref 0.0–0.1)
Basophils Relative: 0.8 % (ref 0.0–3.0)
Eosinophils Absolute: 0.1 10*3/uL (ref 0.0–0.7)
Eosinophils Relative: 1.9 % (ref 0.0–5.0)
HCT: 37.9 % (ref 36.0–46.0)
Hemoglobin: 12.9 g/dL (ref 12.0–15.0)
Lymphocytes Relative: 25.5 % (ref 12.0–46.0)
Lymphs Abs: 1.4 10*3/uL (ref 0.7–4.0)
MCHC: 34 g/dL (ref 30.0–36.0)
MCV: 91.5 fl (ref 78.0–100.0)
Monocytes Absolute: 0.5 10*3/uL (ref 0.1–1.0)
Monocytes Relative: 9.2 % (ref 3.0–12.0)
Neutro Abs: 3.5 10*3/uL (ref 1.4–7.7)
Neutrophils Relative %: 62.6 % (ref 43.0–77.0)
Platelets: 225 10*3/uL (ref 150.0–400.0)
RBC: 4.15 Mil/uL (ref 3.87–5.11)
RDW: 13.9 % (ref 11.5–15.5)
WBC: 5.6 10*3/uL (ref 4.0–10.5)

## 2019-05-05 LAB — VITAMIN D 25 HYDROXY (VIT D DEFICIENCY, FRACTURES): VITD: 52.06 ng/mL (ref 30.00–100.00)

## 2019-05-05 LAB — HEMOGLOBIN A1C: Hgb A1c MFr Bld: 6.1 % (ref 4.6–6.5)

## 2019-05-05 LAB — TSH: TSH: 2.4 u[IU]/mL (ref 0.35–4.50)

## 2019-05-12 ENCOUNTER — Ambulatory Visit (INDEPENDENT_AMBULATORY_CARE_PROVIDER_SITE_OTHER): Payer: 59 | Admitting: Family Medicine

## 2019-05-12 ENCOUNTER — Encounter: Payer: Self-pay | Admitting: Family Medicine

## 2019-05-12 ENCOUNTER — Other Ambulatory Visit: Payer: Self-pay

## 2019-05-12 VITALS — BP 135/80 | HR 70 | Temp 98.2°F | Ht 64.25 in | Wt 226.5 lb

## 2019-05-12 DIAGNOSIS — Z0000 Encounter for general adult medical examination without abnormal findings: Secondary | ICD-10-CM

## 2019-05-12 DIAGNOSIS — I10 Essential (primary) hypertension: Secondary | ICD-10-CM | POA: Diagnosis not present

## 2019-05-12 DIAGNOSIS — R7309 Other abnormal glucose: Secondary | ICD-10-CM

## 2019-05-12 DIAGNOSIS — E6609 Other obesity due to excess calories: Secondary | ICD-10-CM

## 2019-05-12 DIAGNOSIS — E559 Vitamin D deficiency, unspecified: Secondary | ICD-10-CM

## 2019-05-12 DIAGNOSIS — M8588 Other specified disorders of bone density and structure, other site: Secondary | ICD-10-CM

## 2019-05-12 DIAGNOSIS — Z6838 Body mass index (BMI) 38.0-38.9, adult: Secondary | ICD-10-CM

## 2019-05-12 DIAGNOSIS — E2839 Other primary ovarian failure: Secondary | ICD-10-CM

## 2019-05-12 MED ORDER — HYDROCHLOROTHIAZIDE 25 MG PO TABS: ORAL_TABLET | ORAL | 3 refills | Status: DC

## 2019-05-12 NOTE — Assessment & Plan Note (Signed)
Reviewed health habits including diet and exercise and skin cancer prevention Reviewed appropriate screening tests for age  Also reviewed health mt list, fam hx and immunization status , as well as social and family history   See HPI Labs reviewed  Disc shingrix vaccine dexa ordered  Strongly encouraged wt loss/better diet and exercise

## 2019-05-12 NOTE — Assessment & Plan Note (Signed)
Lab Results  Component Value Date   HGBA1C 6.1 05/05/2019   This is up slightly with wt gain and poor diet disc imp of low glycemic diet and wt loss to prevent DM2

## 2019-05-12 NOTE — Assessment & Plan Note (Signed)
Vitamin D level is therapeutic with current supplementation Disc importance of this to bone and overall health Level 52

## 2019-05-12 NOTE — Assessment & Plan Note (Signed)
Discussed how this problem influences overall health and the risks it imposes  Reviewed plan for weight loss with lower calorie diet (via better food choices and also portion control or program like weight watchers) and exercise building up to or more than 30 minutes 5 days per week including some aerobic activity   Pt thinks she may become more motivated

## 2019-05-12 NOTE — Assessment & Plan Note (Signed)
Due for 2 y dexa No falls or fx Taking ca and D D level 52 Encouraged exercise-she is considering  Discussed fall prevention

## 2019-05-12 NOTE — Progress Notes (Signed)
Subjective:    Patient ID: Denise Ali, female    DOB: March 09, 1951, 68 y.o.   MRN: [id number]  HPI Here for health maintenance exam and to review chronic medical problems    Has been ok but gained weight    Wt Readings from Last 3 Encounters:  05/12/19 226 lb 8 oz (102.7 kg)  08/21/18 208 lb 12 oz (94.7 kg)  04/11/18 197 lb (89.4 kg)  some stress related eating  Laziness ?    Thinks she is ready to make some changes  Would consider getting some exercise equip 38.58 kg/m   Flu vaccine -given at work   [place] 11/19- due next month- already has it scheduled for 11/13  Mother had breast cancer  Self breast exam- no lumps   Colonoscopy 9/19 with 3 y recall  Tdap 9/14  dexa 4/18 -osteopenia of the spine Falls-one when she tripped over dog  Fractures-no fractures  Supplements -taking vit D and ca  Exercise - planning to start  D level of 52  zostavax 10/16 Interested in shingrix   pna vaccines complete  bp is stable today  No cp or palpitations or headaches or edema  No side effects to medicines  BP Readings from Last 3 Encounters:  05/12/19 140/82  08/21/18 110/60  08/19/18 130/72     Glucose Lab Results  Component Value Date   HGBA1C 6.1 05/05/2019  up from 5.8   Lab Results  Component Value Date   CREATININE 0.69 05/05/2019   BUN 20 05/05/2019   NA 139 05/05/2019   K 4.3 05/05/2019   CL 103 05/05/2019   CO2 29 05/05/2019   Lab Results  Component Value Date   ALT 14 05/05/2019   AST 15 05/05/2019   ALKPHOS 58 05/05/2019   BILITOT 0.8 05/05/2019   Lab Results  Component Value Date   WBC 5.6 05/05/2019   HGB 12.9 05/05/2019   HCT 37.9 05/05/2019   MCV 91.5 05/05/2019   PLT 225.0 05/05/2019   Lab Results  Component Value Date   TSH 2.40 05/05/2019    Cholesterol Lab Results  Component Value Date   CHOL 175 05/05/2019   CHOL 181 01/22/2018   CHOL 198 10/04/2016   Lab Results  Component Value Date   HDL 58.90 05/05/2019    HDL 59.70 01/22/2018   HDL 72.40 10/04/2016   Lab Results  Component Value Date   LDLCALC 100 (H) 05/05/2019   LDLCALC 108 (H) 01/22/2018   LDLCALC 112 (H) 10/04/2016   Lab Results  Component Value Date   TRIG 83.0 05/05/2019   TRIG 67.0 01/22/2018   TRIG 68.0 10/04/2016   Lab Results  Component Value Date   CHOLHDL 3 05/05/2019   CHOLHDL 3 01/22/2018   CHOLHDL 3 10/04/2016   No results found for: LDLDIRECT Good LDL - improved    Patient Active Problem List   Diagnosis Date Noted  . Pain in joint, ankle and foot, left 08/19/2018  . Colon polyp 01/22/2018  . Estrogen deficiency 10/09/2016  . Elevated glucose level 10/09/2016  . Obesity 12/24/2014  . Hypertension 10/05/2014  . Breast cancer screening, high risk patient 10/18/2011  . Routine general medical examination at a health care facility 07/02/2011  . Encounter for routine gynecological examination 07/02/2011  . Irregular heart beat 04/09/2011  . HYPOKALEMIA 04/22/2009  . Vitamin D deficiency 02/24/2009  . STRESS REACTION, ACUTE, WITH EMOTIONAL DISTURBANCE 11/25/2007  . Osteopenia 01/14/2007  . EDEMA 01/14/2007  Past Medical History:  Diagnosis Date  . Anemia   . Arthritis   . Bradycardia 9/12   mild - at one time with PACs/ asymptomatic   . Breast cancer screening, high risk patient 10/18/2011  . Cataracts, bilateral   . Colon polyp 12/2007  . Disorder of bone and cartilage, unspecified   . Edema   . Family history of malignant neoplasm of breast    evista for BRCA ppx  . Fibrocystic breast   . 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 Hypertension   . Hypopotassemia   . Neoplasm of uncertain behavior of skin   . Osteopenia   . Other screening mammogram   . Predominant disturbance of emotions   . Routine gynecological examination   . Seasonal allergies   . Unspecified vitamin D deficiency    Past Surgical History:  Procedure Laterality Date  .  BREAST BIOPSY  01/2010  . BREAST BIOPSY  8/11   Fibrocystic change  . Breast MRI  01/2010   Normal  . cataracts  9/12   bilateral  . DEXA  12/2003   osteopenia  . DEXA  11/07   stable  . DEXA  1/10   Osteopenia-slightly improved  . LEEP     HPV  . procedure for "bad pap"    . TONSILLECTOMY     Social History   Tobacco Use  . Smoking status: Former Smoker    Quit date: 07/23/1985    Years since quitting: 33.8  . Smokeless tobacco: Never Used  . Tobacco comment: Quit "years ago"  Substance Use Topics  . Alcohol use: Yes    Alcohol/week: 0.0 standard drinks    Comment: Occasional  . Drug use: No   Family History  Problem Relation Age of Onset  . Breast cancer Mother   . Osteoporosis Mother   . Diabetes Father   . Skin cancer Father        ?  Marland Kitchen Osteopenia Sister   . Breast cancer Sister   . Breast cancer Unknown        cousin/Grandmother  . Heart attack Paternal Grandfather   . Colon cancer Neg Hx   . Esophageal cancer Neg Hx   . Rectal cancer Neg Hx   . Stomach cancer Neg Hx    Allergies  Allergen Reactions  . Alendronate Sodium Other (See Comments)    REACTION: leg and joint pain   Current Outpatient Medications on File Prior to Visit  Medication Sig Dispense Refill  . Calcium 1200-1000 MG-UNIT CHEW Chew 1 tablet by mouth daily.      . cholecalciferol (VITAMIN D) 1000 UNITS tablet Take 2,000 Units by mouth daily.     . naproxen sodium (ALEVE) 220 MG tablet Take 220 mg by mouth.     No current facility-administered medications on file prior to visit.     Review of Systems  Constitutional: Negative for activity change, appetite change, fatigue, fever and unexpected weight change.  HENT: Negative for congestion, ear pain, rhinorrhea, sinus pressure and sore throat.   Eyes: Negative for pain, redness and visual disturbance.  Respiratory: Negative for cough, shortness of breath and wheezing.   Cardiovascular: Negative for chest pain and palpitations.   Gastrointestinal: Negative for abdominal pain, blood in stool, constipation and diarrhea.  Endocrine: Negative for polydipsia and polyuria.  Genitourinary: Negative for dysuria, frequency and urgency.  Musculoskeletal: Negative for arthralgias, back pain and myalgias.  Skin: Negative for pallor  and rash.  Allergic/Immunologic: Negative for environmental allergies.  Neurological: Negative for dizziness, syncope and headaches.  Hematological: Negative for adenopathy. Does not bruise/bleed easily.  Psychiatric/Behavioral: Negative for decreased concentration and dysphoric mood. The patient is not nervous/anxious.        Pt feels generally lazy and unmotivated         Objective:   Physical Exam Constitutional:      General: She is not in acute distress.    Appearance: Normal appearance. She is well-developed. She is obese. She is not ill-appearing or diaphoretic.  HENT:     Head: Normocephalic and atraumatic.     Right Ear: Tympanic membrane, ear canal and external ear normal.     Left Ear: Tympanic membrane, ear canal and external ear normal.     Nose: Nose normal. No congestion.     Mouth/Throat:     Mouth: Mucous membranes are moist.     Pharynx: Oropharynx is clear. No posterior oropharyngeal erythema.  Eyes:     General: No scleral icterus.    Extraocular Movements: Extraocular movements intact.     Conjunctiva/sclera: Conjunctivae normal.     Pupils: Pupils are equal, round, and reactive to light.  Neck:     Musculoskeletal: Normal range of motion and neck supple. No neck rigidity or muscular tenderness.     Thyroid: No thyromegaly.     Vascular: No carotid bruit or JVD.  Cardiovascular:     Rate and Rhythm: Normal rate and regular rhythm.     Pulses: Normal pulses.     Heart sounds: Normal heart sounds. No gallop.   Pulmonary:     Effort: Pulmonary effort is normal. No respiratory distress.     Breath sounds: Normal breath sounds. No wheezing.     Comments: Good air  exch Chest:     Chest wall: No tenderness.  Abdominal:     General: Bowel sounds are normal. There is no distension or abdominal bruit.     Palpations: Abdomen is soft. There is no mass.     Tenderness: There is no abdominal tenderness.     Hernia: No hernia is present.  Genitourinary:    Comments: Breast exam: No mass, nodules, thickening, tenderness, bulging, retraction, inflamation, nipple discharge or skin changes noted.  No axillary or clavicular LA.     Musculoskeletal: Normal range of motion.        General: No tenderness.     Right lower leg: No edema.     Left lower leg: No edema.  Lymphadenopathy:     Cervical: No cervical adenopathy.  Skin:    General: Skin is warm and dry.     Coloration: Skin is not pale.     Findings: No erythema or rash.     Comments: Solar lentigines diffusely aks and sks diffusely (she sees derm for tx)  Neurological:     Mental Status: She is alert. Mental status is at baseline.     Cranial Nerves: No cranial nerve deficit.     Motor: No abnormal muscle tone.     Coordination: Coordination normal.     Gait: Gait normal.     Deep Tendon Reflexes: Reflexes are normal and symmetric. Reflexes normal.  Psychiatric:        Mood and Affect: Mood normal.        Cognition and Memory: Cognition and memory normal.           Assessment & Plan:   Problem List Items Addressed  This Visit      Cardiovascular and Mediastinum   Hypertension    bp in fair control at this time  BP Readings from Last 1 Encounters:  05/12/19 135/80   No changes needed Most recent labs reviewed  Disc lifstyle change with low sodium diet and exercise        Relevant Medications   hydrochlorothiazide (HYDRODIURIL) 25 MG tablet     Musculoskeletal and Integument   Osteopenia    Due for 2 y dexa No falls or fx Taking ca and D D level 52 Encouraged exercise-she is considering  Discussed fall prevention         Other   Vitamin D deficiency    Vitamin D  level is therapeutic with current supplementation Disc importance of this to bone and overall health Level 52        Routine general medical examination at a health care facility - Primary    Reviewed health habits including diet and exercise and skin cancer prevention Reviewed appropriate screening tests for age  Also reviewed health mt list, fam hx and immunization status , as well as social and family history   See HPI Labs reviewed  Disc shingrix vaccine dexa ordered  Strongly encouraged wt loss/better diet and exercise         Obesity    Discussed how this problem influences overall health and the risks it imposes  Reviewed plan for weight loss with lower calorie diet (via better food choices and also portion control or program like weight watchers) and exercise building up to or more than 30 minutes 5 days per week including some aerobic activity   Pt thinks she may become more motivated      Estrogen deficiency   Relevant Orders   DG Bone Density   Elevated glucose level    Lab Results  Component Value Date   HGBA1C 6.1 05/05/2019   This is up slightly with wt gain and poor diet disc imp of low glycemic diet and wt loss to prevent DM2

## 2019-05-12 NOTE — Patient Instructions (Addendum)
shingrix is the new shingles vaccine series  Check with your insurance to see if it is covered and where you can get it   For weight loss and diabetes prevention  Try to get most of your carbohydrates from produce (with the exception of white potatoes)  Eat less bread/pasta/rice/snack foods/cereals/sweets and other items from the middle of the grocery store (processed carbs)   Think about exercise - working up to 30 or more minutes per day

## 2019-05-12 NOTE — Assessment & Plan Note (Signed)
bp in fair control at this time  BP Readings from Last 1 Encounters:  05/12/19 135/80   No changes needed Most recent labs reviewed  Disc lifstyle change with low sodium diet and exercise

## 2019-05-26 ENCOUNTER — Ambulatory Visit (INDEPENDENT_AMBULATORY_CARE_PROVIDER_SITE_OTHER)
Admission: RE | Admit: 2019-05-26 | Discharge: 2019-05-26 | Disposition: A | Discharge status: Home / Self Care | Payer: 59 | Source: Ambulatory Visit | Attending: Family Medicine | Admitting: Family Medicine

## 2019-05-26 ENCOUNTER — Other Ambulatory Visit: Payer: 59

## 2019-05-26 ENCOUNTER — Other Ambulatory Visit: Payer: Self-pay

## 2019-05-26 DIAGNOSIS — E2839 Other primary ovarian failure: Secondary | ICD-10-CM | POA: Diagnosis not present

## 2019-06-01 ENCOUNTER — Encounter: Payer: Self-pay | Admitting: *Deleted

## 2019-06-05 ENCOUNTER — Encounter: Payer: Self-pay | Admitting: Family Medicine

## 2019-08-23 ENCOUNTER — Ambulatory Visit: Payer: 59

## 2019-08-28 ENCOUNTER — Ambulatory Visit: Payer: 59

## 2019-08-29 ENCOUNTER — Ambulatory Visit: Payer: 59 | Attending: Internal Medicine

## 2019-08-29 DIAGNOSIS — Z23 Encounter for immunization: Secondary | ICD-10-CM

## 2019-08-29 NOTE — Progress Notes (Signed)
   Covid-19 Vaccination Clinic  Name:  Denise Ali    MRN: [id number] DOB: 12-Jul-1951  08/29/2019  [name] was observed post Covid-19 immunization for 15 minutes without incidence. She was provided with Vaccine Information Sheet and instruction to access the V-Safe system.   [name] was instructed to call 911 with any severe reactions post vaccine: Marland Kitchen Difficulty breathing  . Swelling of your face and throat  . A fast heartbeat  . A bad rash all over your body  . Dizziness and weakness    Immunizations Administered    Name Date Dose VIS Date Route   Pfizer COVID-19 Vaccine 08/29/2019 12:29 PM 0.3 mL 07/03/2019 Intramuscular   Manufacturer: [place]   Lot: [id number]   [place]: [id number]

## 2019-09-22 ENCOUNTER — Ambulatory Visit: Payer: 59 | Attending: Internal Medicine

## 2019-09-22 DIAGNOSIS — Z23 Encounter for immunization: Secondary | ICD-10-CM | POA: Insufficient documentation

## 2019-09-22 NOTE — Progress Notes (Signed)
   Covid-19 Vaccination Clinic  Name:  Denise Ali    MRN: [id number] DOB: 04-May-1951  09/22/2019  Denise Ali was observed post Covid-19 immunization for 15 minutes without incident. She was provided with Vaccine Information Sheet and instruction to access the V-Safe system.   Denise Ali was instructed to call 911 with any severe reactions post vaccine: Marland Kitchen Difficulty breathing  . Swelling of face and throat  . A fast heartbeat  . A bad rash all over body  . Dizziness and weakness   Immunizations Administered    Name Date Dose VIS Date Route   Pfizer COVID-19 Vaccine 09/22/2019  9:24 AM 0.3 mL 07/03/2019 Intramuscular   Manufacturer: [place]   Lot: [id number]   [place]: [id number]

## 2019-11-23 ENCOUNTER — Ambulatory Visit (INDEPENDENT_AMBULATORY_CARE_PROVIDER_SITE_OTHER): Payer: 59 | Admitting: Podiatrist

## 2019-11-23 ENCOUNTER — Other Ambulatory Visit: Payer: Self-pay

## 2019-11-23 ENCOUNTER — Encounter: Payer: Self-pay | Admitting: Podiatrist

## 2019-11-23 VITALS — Temp 97.3°F

## 2019-11-23 DIAGNOSIS — M2040 Other hammer toe(s) (acquired), unspecified foot: Secondary | ICD-10-CM | POA: Diagnosis not present

## 2019-11-23 DIAGNOSIS — L84 Corns and callosities: Secondary | ICD-10-CM | POA: Diagnosis not present

## 2019-11-23 NOTE — Progress Notes (Signed)
  Chief Complaint  Patient presents with  . Callouses    "Corn" - L foot, 5th toe. Pt stated, "I've had it for years. It's so sensitive that it can wake me up if the bedsheets touch it".     HPI: Patient is 69 y.o. female who presents today for the concerns as listed above.  The hard corn on her small toe on the left is tender in shoes and she has found no relief with corn pads.  She is also experiencing contracture of her digits on both feet.    Review of Systems No fevers, chills, nausea, muscle aches, no difficulty breathing, no calf pain, no chest pain or shortness of breath.   Physical Exam  GENERAL APPEARANCE: Alert, conversant. Appropriately groomed. No acute distress.   VASCULAR: Pedal pulses palpable DP and PT bilateral.  Capillary refill time is immediate to all digits,  Proximal to distal cooling it warm to warm.  Digital hair growth is present bilateral   NEUROLOGIC: sensation is intact epicritically and protectively to 5.07 monofilament at 5/5 sites bilateral.  Light touch is intact bilateral, vibratory sensation intact bilateral, achilles tendon reflex is intact bilateral.   MUSCULOSKELETAL: acceptable muscle strength, tone and stability bilateral.  Hammertoe contracture of lesser digits bilateral feet noted 2-5.   No pain, crepitus or limitation noted with foot and ankle range of motion bilateral.   DERMATOLOGIC: skin is warm, supple, and dry.  No open lesions noted.  No rash, no pre ulcerative lesions. Digital nails are asymptomatic.  Hard intractable keratosis present left fifth toe.  No underlying ulceration or hemorrhagic tissue present.      Assessment     ICD-10-CM   1. Hammer toe, unspecified laterality  M20.40   2. Corns and callosities  L84      Plan  Sharp paring with a 15 blade performed and the keratosis core was removed as well as the surrounding hardened skin.  This was performed without iatrogenic incident.  A silicone protective sleeve was dispensed  and she will return as needed for follow up.  Also briefly discussed hammertoes and exercises for hammertoes.

## 2019-11-23 NOTE — Patient Instructions (Signed)

## 2020-02-24 IMAGING — DX DG ANKLE COMPLETE 3+V*L*
3 series · 3 of 3 positions shown · non-contrast
Comparison: None.

CLINICAL DATA: Left foot and ankle pain.  No known injury.

EXAM:
LEFT ANKLE COMPLETE - 3+ VIEW

[ankle ap]
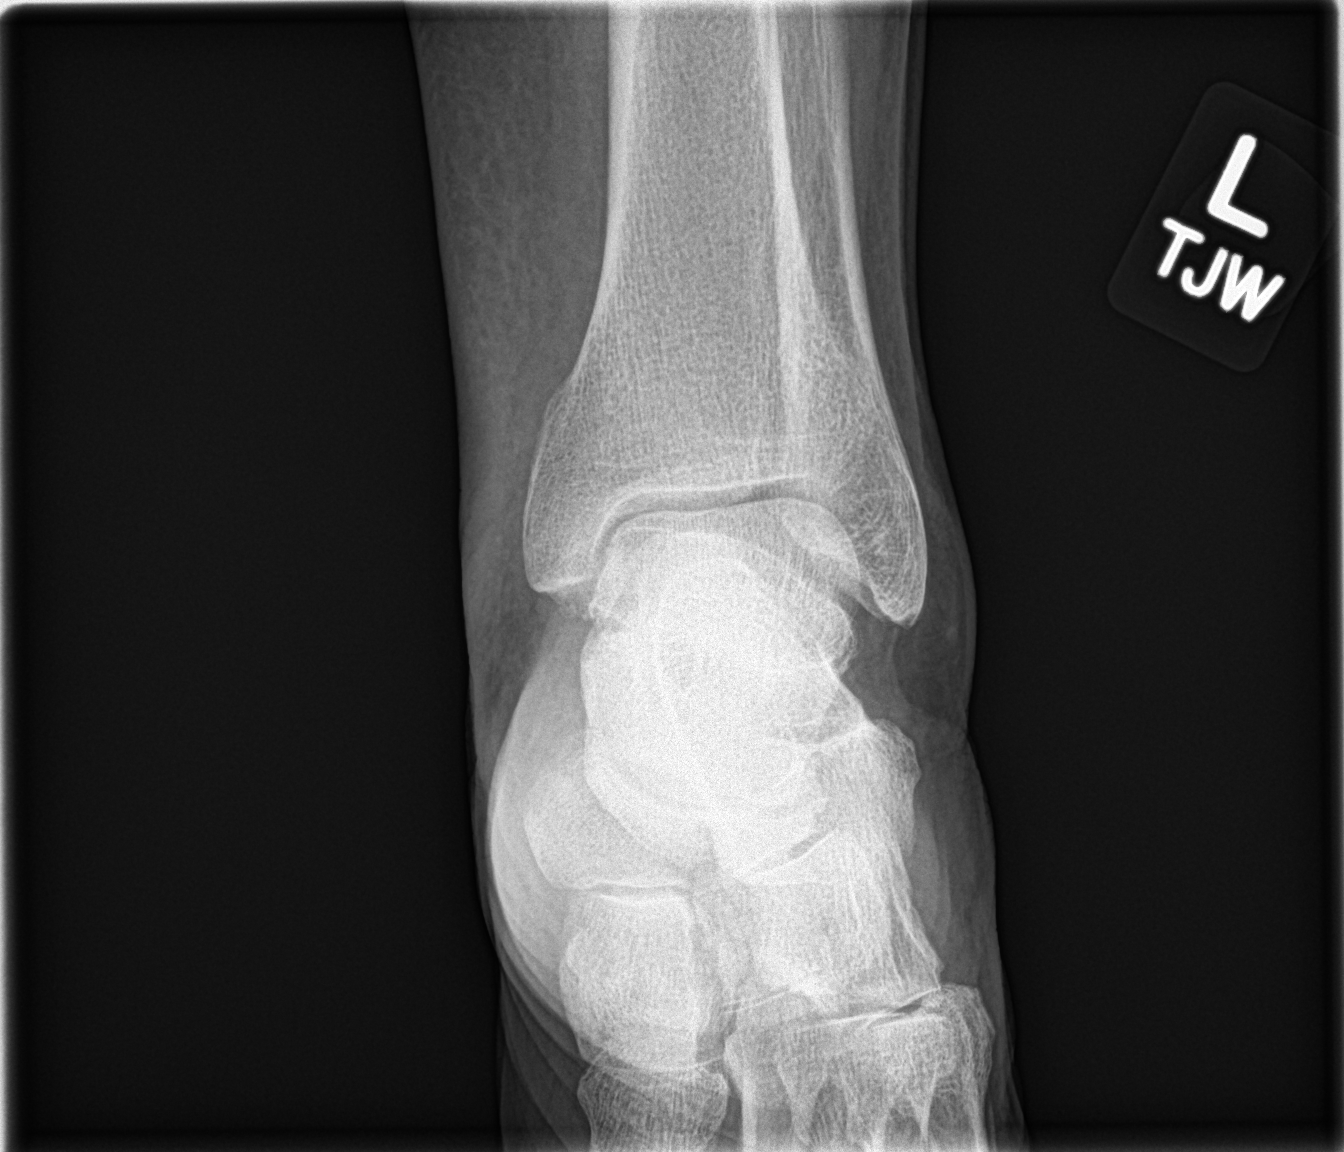

[ankle obl]
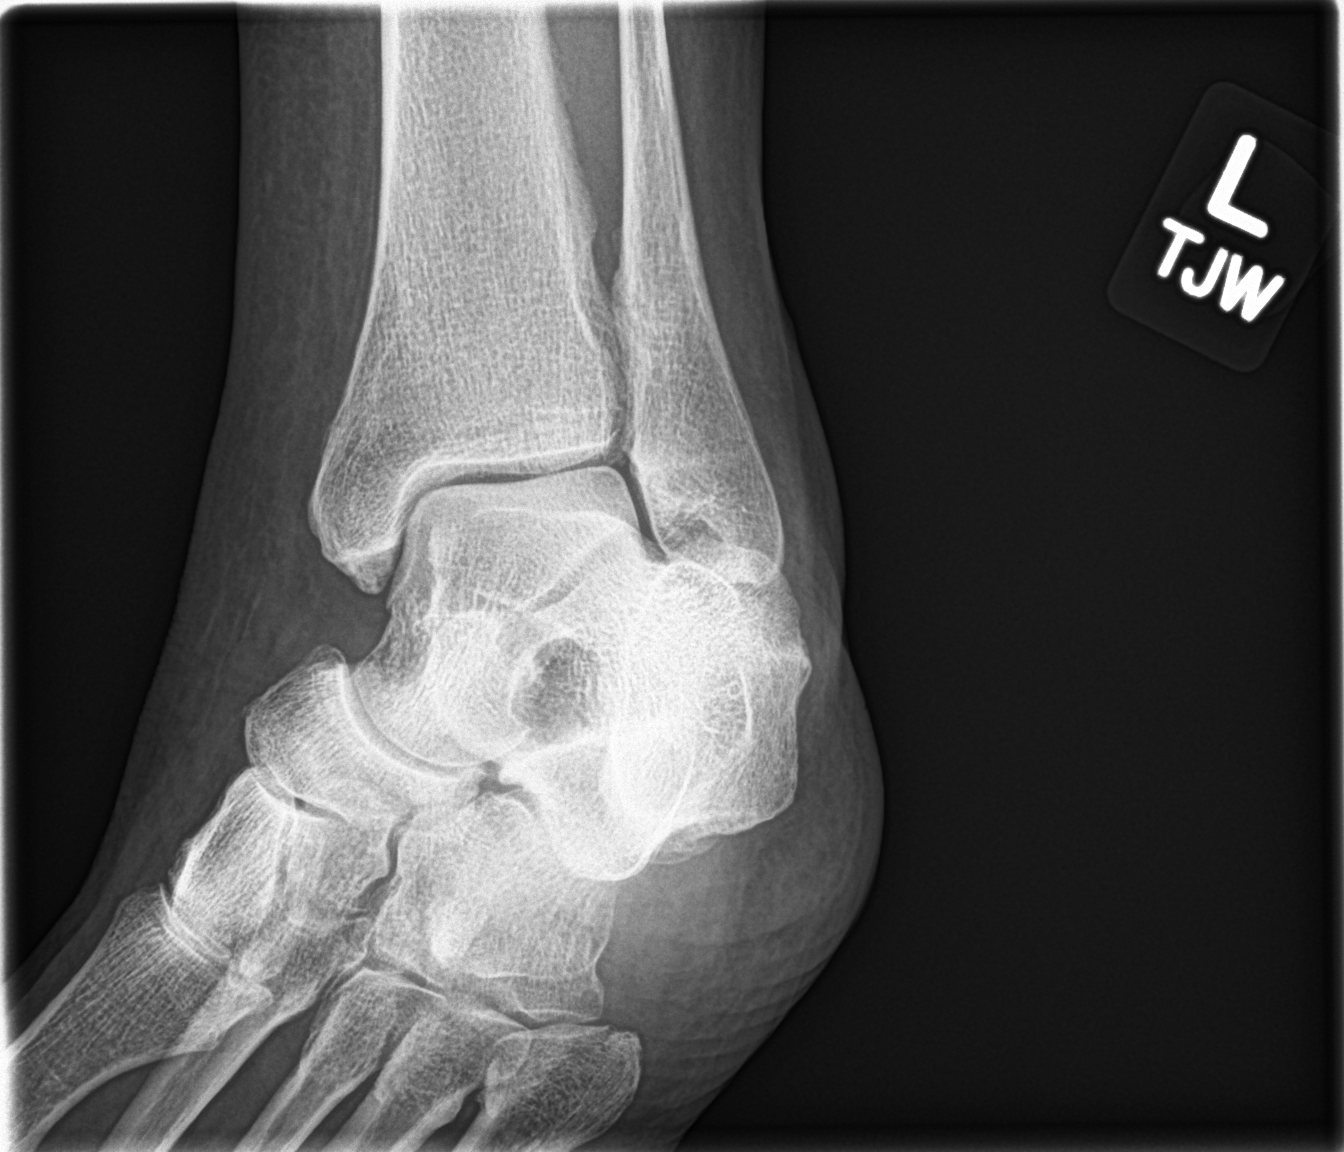

[ankle lat]
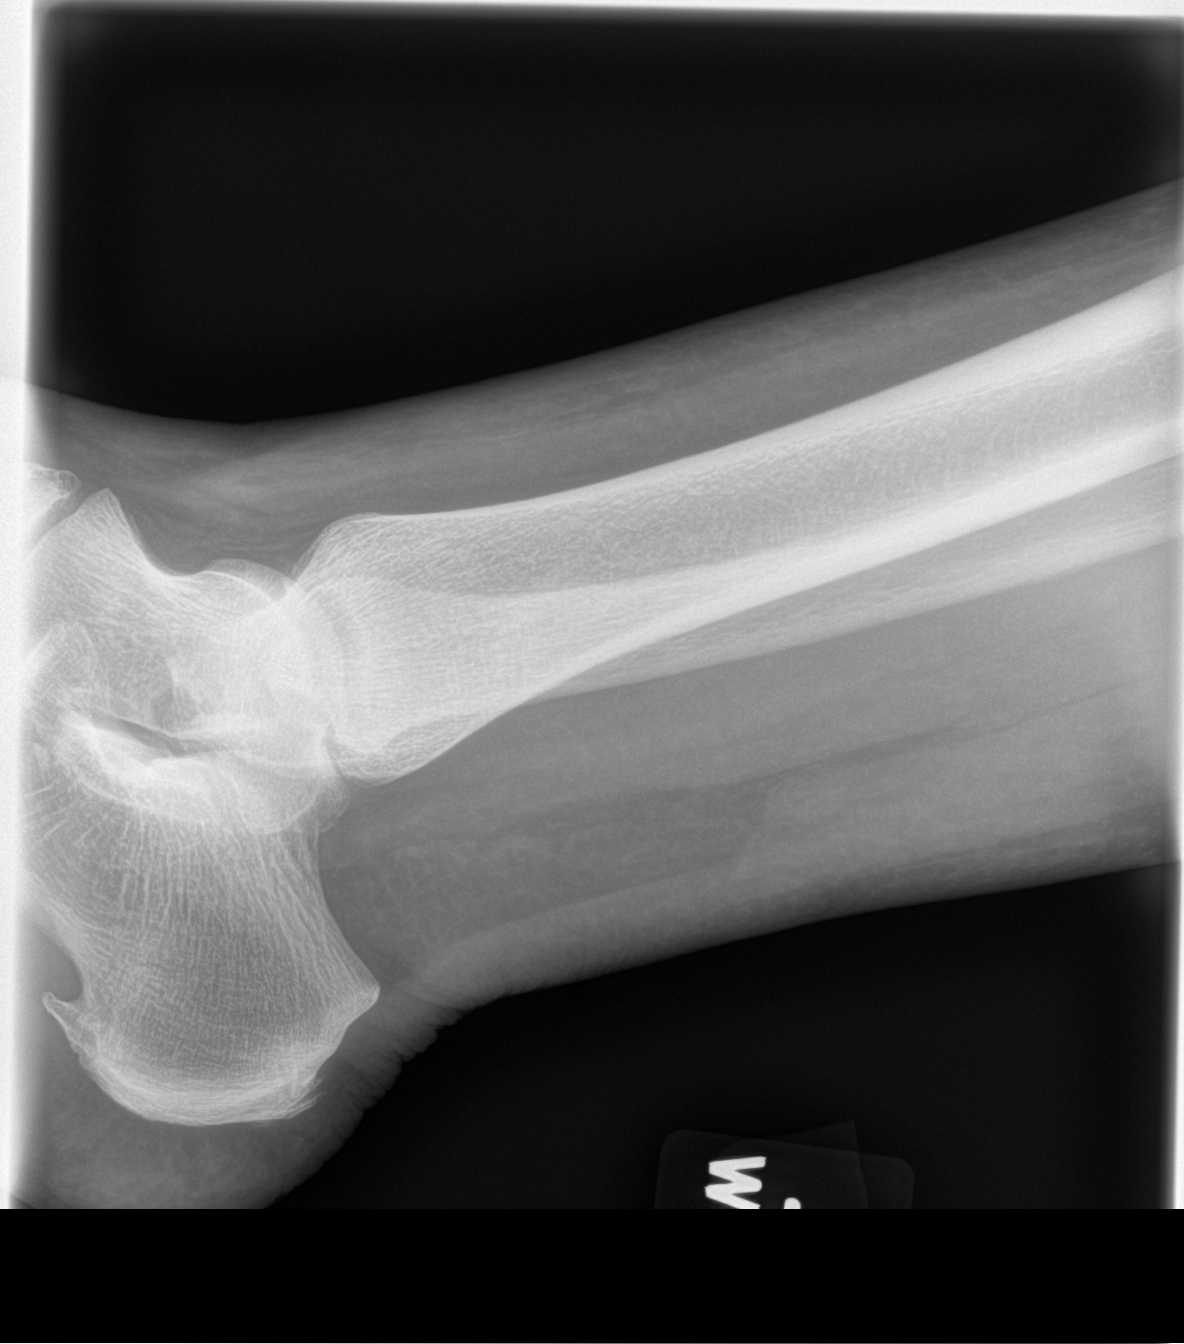

[3 of 3 positions shown; findings below may reference images not displayed]

FINDINGS: Soft tissues about the medial aspect of the lower leg appear
edematous. No radiopaque foreign body or soft tissue gas is seen. No
acute bony or joint abnormality. Plantar calcaneal spur noted.
IMPRESSION: Soft tissues about the medial aspect of the lower leg appear
edematous which could be due to dependent change or cellulitis.

No acute bony or joint abnormality.

Plantar calcaneal spur.

## 2020-05-31 ENCOUNTER — Other Ambulatory Visit: Payer: Self-pay | Admitting: Family Medicine

## 2020-09-02 ENCOUNTER — Other Ambulatory Visit: Payer: Self-pay | Admitting: Family Medicine

## 2020-09-05 ENCOUNTER — Telehealth: Payer: Self-pay | Admitting: Family Medicine

## 2020-09-05 DIAGNOSIS — E559 Vitamin D deficiency, unspecified: Secondary | ICD-10-CM

## 2020-09-05 DIAGNOSIS — I10 Essential (primary) hypertension: Secondary | ICD-10-CM

## 2020-09-05 DIAGNOSIS — R7309 Other abnormal glucose: Secondary | ICD-10-CM

## 2020-09-05 NOTE — Telephone Encounter (Signed)
-----   Message from Cloyd Stagers, RT sent at 09/02/2020 11:24 AM EST ----- Regarding: Lab Orders for Wednesday 2.16.2022 Please place lab orders for Wednesday 2.16.2022, office visit for physical on Wednesday 2.23.2022 Thank you, Dyke Maes RT(R)

## 2020-09-07 ENCOUNTER — Other Ambulatory Visit (INDEPENDENT_AMBULATORY_CARE_PROVIDER_SITE_OTHER): Payer: 59

## 2020-09-07 ENCOUNTER — Other Ambulatory Visit: Payer: Self-pay

## 2020-09-07 DIAGNOSIS — I10 Essential (primary) hypertension: Secondary | ICD-10-CM

## 2020-09-07 DIAGNOSIS — R7309 Other abnormal glucose: Secondary | ICD-10-CM

## 2020-09-07 DIAGNOSIS — E559 Vitamin D deficiency, unspecified: Secondary | ICD-10-CM | POA: Diagnosis not present

## 2020-09-08 LAB — CBC WITH DIFFERENTIAL/PLATELET
Basophils Absolute: 0.1 10*3/uL (ref 0.0–0.1)
Basophils Relative: 1.9 % (ref 0.0–3.0)
Eosinophils Absolute: 0.2 10*3/uL (ref 0.0–0.7)
Eosinophils Relative: 2.1 % (ref 0.0–5.0)
HCT: 39.5 % (ref 36.0–46.0)
Hemoglobin: 13.4 g/dL (ref 12.0–15.0)
Lymphocytes Relative: 30.6 % (ref 12.0–46.0)
Lymphs Abs: 2.4 10*3/uL (ref 0.7–4.0)
MCHC: 34 g/dL (ref 30.0–36.0)
MCV: 90.6 fl (ref 78.0–100.0)
Monocytes Absolute: 0.7 10*3/uL (ref 0.1–1.0)
Monocytes Relative: 8.9 % (ref 3.0–12.0)
Neutro Abs: 4.4 10*3/uL (ref 1.4–7.7)
Neutrophils Relative %: 56.5 % (ref 43.0–77.0)
Platelets: 251 10*3/uL (ref 150.0–400.0)
RBC: 4.35 Mil/uL (ref 3.87–5.11)
RDW: 14 % (ref 11.5–15.5)
WBC: 7.7 10*3/uL (ref 4.0–10.5)

## 2020-09-08 LAB — COMPREHENSIVE METABOLIC PANEL
ALT: 15 U/L (ref 0–35)
AST: 18 U/L (ref 0–37)
Albumin: 4.2 g/dL (ref 3.5–5.2)
Alkaline Phosphatase: 69 U/L (ref 39–117)
BUN: 19 mg/dL (ref 6–23)
CO2: 29 mEq/L (ref 19–32)
Calcium: 10.5 mg/dL (ref 8.4–10.5)
Chloride: 99 mEq/L (ref 96–112)
Creatinine, Ser: 0.73 mg/dL (ref 0.40–1.20)
GFR: 84.08 mL/min (ref >60.00)
Glucose, Bld: 86 mg/dL (ref 70–99)
Potassium: 3.7 mEq/L (ref 3.5–5.1)
Sodium: 134 mEq/L — ABNORMAL LOW (ref 135–145)
Total Bilirubin: 0.7 mg/dL (ref 0.2–1.2)
Total Protein: 7.2 g/dL (ref 6.0–8.3)

## 2020-09-08 LAB — LIPID PANEL
Cholesterol: 185 mg/dL (ref 0–200)
HDL: 68.5 mg/dL (ref >39.00)
LDL Cholesterol: 99 mg/dL (ref 0–99)
NonHDL: 116.15
Total CHOL/HDL Ratio: 3
Triglycerides: 88 mg/dL (ref 0.0–149.0)
VLDL: 17.6 mg/dL (ref 0.0–40.0)

## 2020-09-08 LAB — VITAMIN D 25 HYDROXY (VIT D DEFICIENCY, FRACTURES): VITD: 42.01 ng/mL (ref 30.00–100.00)

## 2020-09-08 LAB — HEMOGLOBIN A1C: Hgb A1c MFr Bld: 5.9 % (ref 4.6–6.5)

## 2020-09-08 LAB — TSH: TSH: 2.92 u[IU]/mL (ref 0.35–4.50)

## 2020-09-14 ENCOUNTER — Ambulatory Visit (INDEPENDENT_AMBULATORY_CARE_PROVIDER_SITE_OTHER): Payer: 59 | Admitting: Family Medicine

## 2020-09-14 ENCOUNTER — Encounter: Payer: Self-pay | Admitting: Family Medicine

## 2020-09-14 ENCOUNTER — Other Ambulatory Visit: Payer: Self-pay

## 2020-09-14 VITALS — BP 130/78 | HR 62 | Temp 96.9°F | Ht 64.0 in | Wt 223.4 lb

## 2020-09-14 DIAGNOSIS — M8588 Other specified disorders of bone density and structure, other site: Secondary | ICD-10-CM

## 2020-09-14 DIAGNOSIS — Z0000 Encounter for general adult medical examination without abnormal findings: Secondary | ICD-10-CM | POA: Diagnosis not present

## 2020-09-14 DIAGNOSIS — E559 Vitamin D deficiency, unspecified: Secondary | ICD-10-CM

## 2020-09-14 DIAGNOSIS — R7309 Other abnormal glucose: Secondary | ICD-10-CM

## 2020-09-14 DIAGNOSIS — E6609 Other obesity due to excess calories: Secondary | ICD-10-CM

## 2020-09-14 DIAGNOSIS — Z1239 Encounter for other screening for malignant neoplasm of breast: Secondary | ICD-10-CM

## 2020-09-14 DIAGNOSIS — R1032 Left lower quadrant pain: Secondary | ICD-10-CM | POA: Insufficient documentation

## 2020-09-14 DIAGNOSIS — I10 Essential (primary) hypertension: Secondary | ICD-10-CM | POA: Diagnosis not present

## 2020-09-14 DIAGNOSIS — Z6838 Body mass index (BMI) 38.0-38.9, adult: Secondary | ICD-10-CM

## 2020-09-14 MED ORDER — HYDROCHLOROTHIAZIDE 25 MG PO TABS: 25.0000 mg | ORAL_TABLET | Freq: Every day | ORAL | 3 refills | Status: DC

## 2020-09-14 NOTE — Assessment & Plan Note (Signed)
Reviewed health habits including diet and exercise and skin cancer prevention Reviewed appropriate screening tests for age  Also reviewed health mt list, fam hx and immunization status , as well as social and family history   See HPI Labs reviewed  Colonoscopy and mammogram utd (considering breast mri if covered) dexa utd (no fractures)

## 2020-09-14 NOTE — Assessment & Plan Note (Signed)
Discussed how this problem influences overall health and the risks it imposes  Reviewed plan for weight loss with lower calorie diet (via better food choices and also portion control or program like weight watchers) and exercise building up to or more than 30 minutes 5 days per week including some aerobic activity    

## 2020-09-14 NOTE — Assessment & Plan Note (Signed)
Lab Results  Component Value Date   HGBA1C 5.9 09/07/2020   Stable  disc imp of low glycemic diet and wt loss to prevent DM2

## 2020-09-14 NOTE — Assessment & Plan Note (Signed)
Mammogram nl  MRI suggested (last one was in 2013)  Plans to call ins co to see if it is covered

## 2020-09-14 NOTE — Progress Notes (Signed)
Subjective:    Patient ID: Denise Ali, female    DOB: Nov 19, 1950, 70 y.o.   MRN: [id number]  This visit occurred during the SARS-CoV-2 public health emergency.  Safety protocols were in place, including screening questions prior to the visit, additional usage of staff PPE, and extensive cleaning of exam room while observing appropriate contact time as indicated for disinfecting solutions.    HPI Here for health maintenance exam and to review chronic medical problems    Wt Readings from Last 3 Encounters:  09/14/20 223 lb 6 oz (101.3 kg)  05/12/19 226 lb 8 oz (102.7 kg)  08/21/18 208 lb 12 oz (94.7 kg)   38.34 kg/m  Working  Is going to retire this year - in oct (excited about that)  Will be able to be with grandkids and get some things done   Having some hip and knee pain  Has fallen twice due to pain  L groin pain/ some lower back    Colonoscopy 9/19 with 3 y recall   Mammogram 12/21 Self breast exam  She has inc risk of breast cancer with family hx  She had a neg breast MRI in 2013  MRI recommended- ? If covered    covid immunized with booster Tdap 9/14 pna vaccine completed Flu shot 10/21 zostavax 10/16  Interested in shingrix    dexa 11/20  Osteopenia , fairly stable-mixed trend  Falls- 2 (related to hip/groin pain)  Fractures-none  Supplements-taking vit D (ran out of for 2 weeks)  D level is 42.01 Exercise - none- wants/needs to start (planning retirement)  Would like to walk when days get longer   Has a gym membership- cannot use now     HTN  bp is stable today  No cp or palpitations or headaches or edema  No side effects to medicines  BP Readings from Last 3 Encounters:  09/14/20 130/78  05/12/19 135/80  08/21/18 110/60     Takes hctz 25 mg daily   Lab Results  Component Value Date   CREATININE 0.73 09/07/2020   BUN 19 09/07/2020   NA 134 (L) 09/07/2020   K 3.7 09/07/2020   CL 99 09/07/2020   CO2 29 09/07/2020     Elevated glucose level Lab Results  Component Value Date   HGBA1C 5.9 09/07/2020  improved   Cholesterol Lab Results  Component Value Date   CHOL 185 09/07/2020   CHOL 175 05/05/2019   CHOL 181 01/22/2018   Lab Results  Component Value Date   HDL 68.50 09/07/2020   HDL 58.90 05/05/2019   HDL 59.70 01/22/2018   Lab Results  Component Value Date   LDLCALC 99 09/07/2020   LDLCALC 100 (H) 05/05/2019   LDLCALC 108 (H) 01/22/2018   Lab Results  Component Value Date   TRIG 88.0 09/07/2020   TRIG 83.0 05/05/2019   TRIG 67.0 01/22/2018   Lab Results  Component Value Date   CHOLHDL 3 09/07/2020   CHOLHDL 3 05/05/2019   CHOLHDL 3 01/22/2018   No results found for: LDLDIRECT  Diet is not good Salty/crunchy snack foods  Trying to eat fruit  Not enough green veggies   Patient Active Problem List   Diagnosis Date Noted  . Pain in joint, ankle and foot, left 08/19/2018  . Colon polyp 01/22/2018  . Estrogen deficiency 10/09/2016  . Elevated glucose level 10/09/2016  . Obesity 12/24/2014  . Hypertension 10/05/2014  . Breast cancer screening, high risk patient 10/18/2011  .  Routine general medical examination at a health care facility 07/02/2011  . Encounter for routine gynecological examination 07/02/2011  . Irregular heart beat 04/09/2011  . HYPOKALEMIA 04/22/2009  . Vitamin D deficiency 02/24/2009  . STRESS REACTION, ACUTE, WITH EMOTIONAL DISTURBANCE 11/25/2007  . Osteopenia 01/14/2007  . EDEMA 01/14/2007   Past Medical History:  Diagnosis Date  . Anemia   . Arthritis   . Bradycardia 9/12   mild - at one time with PACs/ asymptomatic   . Breast cancer screening, high risk patient 10/18/2011  . Cataracts, bilateral   . Colon polyp 12/2007  . Disorder of bone and cartilage, unspecified   . Edema   . Family history of malignant neoplasm of breast    evista for BRCA ppx  . Fibrocystic breast   . Human papillomavirus in conditions classified elsewhere and of  unspecified site   . Hypertension   . Hypopotassemia   . Neoplasm of uncertain behavior of skin   . Osteopenia   . Other screening mammogram   . Predominant disturbance of emotions   . Routine gynecological examination   . Seasonal allergies   . Unspecified vitamin D deficiency    Past Surgical History:  Procedure Laterality Date  . BREAST BIOPSY  01/2010  . BREAST BIOPSY  8/11   Fibrocystic change  . Breast MRI  01/2010   Normal  . cataracts  9/12   bilateral  . DEXA  12/2003   osteopenia  . DEXA  11/07   stable  . DEXA  1/10   Osteopenia-slightly improved  . LEEP     HPV  . procedure for "bad pap"    . TONSILLECTOMY     Social History   Tobacco Use  . Smoking status: Former Smoker    Quit date: 07/23/1985    Years since quitting: 35.1  . Smokeless tobacco: Never Used  . Tobacco comment: Quit "years ago"  Vaping Use  . Vaping Use: Never used  Substance Use Topics  . Alcohol use: Yes    Alcohol/week: 0.0 standard drinks    Comment: Occasional  . Drug use: No   Family History  Problem Relation Age of Onset  . Breast cancer Mother   . Osteoporosis Mother   . Diabetes Father   . Skin cancer Father        ?  Marland Kitchen Osteopenia Sister   . Breast cancer Sister   . Breast cancer Unknown        cousin/Grandmother  . Heart attack Paternal Grandfather   . Colon cancer Neg Hx   . Esophageal cancer Neg Hx   . Rectal cancer Neg Hx   . Stomach cancer Neg Hx    Allergies  Allergen Reactions  . Alendronate Sodium Other (See Comments)    REACTION: leg and joint pain   Current Outpatient Medications on File Prior to Visit  Medication Sig Dispense Refill  . Calcium 1200-1000 MG-UNIT CHEW Chew 1 tablet by mouth daily.    . cholecalciferol (VITAMIN D) 1000 UNITS tablet Take 2,000 Units by mouth daily.     . naproxen sodium (ALEVE) 220 MG tablet Take 220 mg by mouth.     No current facility-administered medications on file prior to visit.    Review of Systems   Constitutional: Negative for activity change, appetite change, fatigue, fever and unexpected weight change.  HENT: Negative for congestion, ear pain, rhinorrhea, sinus pressure and sore throat.   Eyes: Negative for pain, redness and visual disturbance.  Respiratory: Negative for cough, shortness of breath and wheezing.   Cardiovascular: Negative for chest pain and palpitations.  Gastrointestinal: Negative for abdominal pain, blood in stool, constipation and diarrhea.  Endocrine: Negative for polydipsia and polyuria.  Genitourinary: Negative for dysuria, frequency and urgency.  Musculoskeletal: Negative for arthralgias, back pain and myalgias.       Left groin pain/hip?  Also occ L knee pain   Skin: Negative for pallor and rash.  Allergic/Immunologic: Negative for environmental allergies.  Neurological: Negative for dizziness, syncope and headaches.  Hematological: Negative for adenopathy. Does not bruise/bleed easily.  Psychiatric/Behavioral: Negative for decreased concentration and dysphoric mood. The patient is not nervous/anxious.        Objective:   Physical Exam Constitutional:      General: She is not in acute distress.    Appearance: Normal appearance. She is well-developed. She is obese. She is not ill-appearing or diaphoretic.  HENT:     Head: Normocephalic and atraumatic.     Right Ear: Tympanic membrane, ear canal and external ear normal.     Left Ear: Tympanic membrane, ear canal and external ear normal.     Nose: Nose normal. No congestion.     Mouth/Throat:     Mouth: Mucous membranes are moist.     Pharynx: Oropharynx is clear. No posterior oropharyngeal erythema.  Eyes:     General: No scleral icterus.    Extraocular Movements: Extraocular movements intact.     Conjunctiva/sclera: Conjunctivae normal.     Pupils: Pupils are equal, round, and reactive to light.  Neck:     Thyroid: No thyromegaly.     Vascular: No carotid bruit or JVD.  Cardiovascular:      Rate and Rhythm: Normal rate and regular rhythm.     Pulses: Normal pulses.     Heart sounds: Normal heart sounds. No gallop.   Pulmonary:     Effort: Pulmonary effort is normal. No respiratory distress.     Breath sounds: Normal breath sounds. No wheezing.     Comments: Good air exch Chest:     Chest wall: No tenderness.  Abdominal:     General: Bowel sounds are normal. There is no distension or abdominal bruit.     Palpations: Abdomen is soft. There is no mass.     Tenderness: There is no abdominal tenderness.     Hernia: No hernia is present.  Genitourinary:    Comments: Breast exam: No mass, nodules, thickening, tenderness, bulging, retraction, inflamation, nipple discharge or skin changes noted.  No axillary or clavicular LA.     Musculoskeletal:        General: No tenderness. Normal range of motion.     Cervical back: Normal range of motion and neck supple. No rigidity. No muscular tenderness.     Right lower leg: No edema.     Left lower leg: No edema.     Comments: No kyphosis   Lymphadenopathy:     Cervical: No cervical adenopathy.  Skin:    General: Skin is warm and dry.     Coloration: Skin is not pale.     Findings: No erythema or rash.     Comments: Solar lentigines diffusely   Neurological:     Mental Status: She is alert. Mental status is at baseline.     Cranial Nerves: No cranial nerve deficit.     Motor: No abnormal muscle tone.     Coordination: Coordination normal.     Gait: Gait normal.  Deep Tendon Reflexes: Reflexes are normal and symmetric. Reflexes normal.  Psychiatric:        Mood and Affect: Mood normal.        Cognition and Memory: Cognition and memory normal.           Assessment & Plan:   Problem List Items Addressed This Visit      Cardiovascular and Mediastinum   Hypertension    bp in fair control at this time  BP Readings from Last 1 Encounters:  09/14/20 130/78   No changes needed Most recent labs reviewed  Disc lifstyle  change with low sodium diet and exercise  Plan to continue hctz 25 mg daily       Relevant Medications   hydrochlorothiazide (HYDRODIURIL) 25 MG tablet     Musculoskeletal and Integument   Osteopenia    dexa 11/20 2 falls and no fractures Taking vit D with level of 42 Needs to add more exercise and plans to  Can do next desa 11/22 or later        Other   Vitamin D deficiency    Level in nl range at 42  Vitamin D level is therapeutic with current supplementation Disc importance of this to bone and overall health       Routine general medical examination at a health care facility - Primary    Reviewed health habits including diet and exercise and skin cancer prevention Reviewed appropriate screening tests for age  Also reviewed health mt list, fam hx and immunization status , as well as social and family history   See HPI Labs reviewed  Colonoscopy and mammogram utd (considering breast mri if covered) dexa utd (no fractures)       Breast cancer screening, high risk patient    Mammogram nl  MRI suggested (last one was in 2013)  Plans to call ins co to see if it is covered       Obesity    Discussed how this problem influences overall health and the risks it imposes  Reviewed plan for weight loss with lower calorie diet (via better food choices and also portion control or program like weight watchers) and exercise building up to or more than 30 minutes 5 days per week including some aerobic activity         Elevated glucose level    Lab Results  Component Value Date   HGBA1C 5.9 09/07/2020   Stable  disc imp of low glycemic diet and wt loss to prevent DM2

## 2020-09-14 NOTE — Assessment & Plan Note (Signed)
dexa 11/20 2 falls and no fractures Taking vit D with level of 42 Needs to add more exercise and plans to  Can do next desa 11/22 or later

## 2020-09-14 NOTE — Assessment & Plan Note (Signed)
Level in nl range at 42  Vitamin D level is therapeutic with current supplementation Disc importance of this to bone and overall health

## 2020-09-14 NOTE — Patient Instructions (Addendum)
Call later this week to set up a time for xray of left hip   Tell your insurance co that your risk for breast cancer is high and ask if MRI is covered  If so, let me know and I will order it   If you are interested in the new shingles vaccine (Shingrix) - call your local pharmacy to check on coverage and availability  If affordable, get on a wait list at your pharmacy to get the vaccine.  Try to get most of your carbohydrates from produce (with the exception of white potatoes)  Eat less bread/pasta/rice/snack foods/cereals/sweets and other items from the middle of the grocery store (processed carbs) Avoid red meat/ fried foods/ egg yolks/ fatty breakfast meats/ butter, cheese and high fat dairy/ and shellfish   Incorporate more raw veggies

## 2020-09-14 NOTE — Assessment & Plan Note (Signed)
bp in fair control at this time  BP Readings from Last 1 Encounters:  09/14/20 130/78   No changes needed Most recent labs reviewed  Disc lifstyle change with low sodium diet and exercise  Plan to continue hctz 25 mg daily

## 2020-09-16 ENCOUNTER — Telehealth: Payer: Self-pay | Admitting: Family Medicine

## 2020-09-16 DIAGNOSIS — R1032 Left lower quadrant pain: Secondary | ICD-10-CM

## 2020-09-16 DIAGNOSIS — Z1239 Encounter for other screening for malignant neoplasm of breast: Secondary | ICD-10-CM

## 2020-09-16 NOTE — Telephone Encounter (Signed)
Pt called in was told that she needed to get a x-ray of her hip, and she was told that MRI for her breast and its covered at 80/20 and she needs a PA 90 Days before appointment

## 2020-09-16 NOTE — Telephone Encounter (Signed)
I ordered the hip film so she can come in to do that   I ordered the MRI so we can start working on referral Thanks

## 2020-09-19 NOTE — Telephone Encounter (Signed)
Pt notified of Dr. Marliss Coots comments she will come in and get xrays done when able. Pt also said insurance requires her to go to a "stand alone" imagining center not connected to a hospital. Pt would like to go to [facility] but she said insurance also said something about [facility] [employer] [facility]. I advise pt our [facility] will work on it and if there is any issues on the PA (getting [place] [facility] approved) they will call her. Pt verbalized understanding

## 2020-09-19 NOTE — Telephone Encounter (Signed)
Spoke with patient about MRI of breasts approved with both insurances. Provided phone number to [place] to call and set up her appointment when ready.

## 2020-09-20 ENCOUNTER — Ambulatory Visit (INDEPENDENT_AMBULATORY_CARE_PROVIDER_SITE_OTHER)
Admission: RE | Admit: 2020-09-20 | Discharge: 2020-09-20 | Disposition: A | Discharge status: Home / Self Care | Payer: 59 | Source: Ambulatory Visit | Attending: Family Medicine | Admitting: Family Medicine

## 2020-09-20 DIAGNOSIS — R1032 Left lower quadrant pain: Secondary | ICD-10-CM | POA: Diagnosis not present

## 2020-09-21 ENCOUNTER — Telehealth: Payer: Self-pay | Admitting: Family Medicine

## 2020-09-21 DIAGNOSIS — M1612 Unilateral primary osteoarthritis, left hip: Secondary | ICD-10-CM | POA: Insufficient documentation

## 2020-09-21 DIAGNOSIS — R1032 Left lower quadrant pain: Secondary | ICD-10-CM

## 2020-09-21 DIAGNOSIS — M160 Bilateral primary osteoarthritis of hip: Secondary | ICD-10-CM

## 2020-09-21 DIAGNOSIS — M169 Osteoarthritis of hip, unspecified: Secondary | ICD-10-CM | POA: Insufficient documentation

## 2020-09-21 NOTE — Telephone Encounter (Signed)
-----   Message from Tammi Sou, [place] sent at 09/21/2020  4:30 PM EST ----- Pt notified of xray results and Dr. Marliss Coots comments. Pt agrees with Ortho referral and would like to see someone in [place] if possible, I advise her PCP will put referral in and our [facility] will call to schedule appt

## 2020-09-28 ENCOUNTER — Ambulatory Visit (INDEPENDENT_AMBULATORY_CARE_PROVIDER_SITE_OTHER): Payer: 59 | Admitting: Orthopaedic Surgery

## 2020-09-28 ENCOUNTER — Other Ambulatory Visit: Payer: Self-pay

## 2020-09-28 DIAGNOSIS — M1612 Unilateral primary osteoarthritis, left hip: Secondary | ICD-10-CM | POA: Diagnosis not present

## 2020-09-28 NOTE — Progress Notes (Signed)
Office Visit Note   Patient: Denise Ali           Date of Birth: October 10, 1950           MRN: [id number] Visit Date: 09/28/2020              Requested by: Tower, Wynelle Fanny, MD [place],  [place] [id number] PCP: Abner Greenspan, MD   Assessment & Plan: Visit Diagnoses:  1. Unilateral primary osteoarthritis, left hip     Plan: Impression is left hip advanced degenerative joint disease.  We did discuss various treatment options to include cortisone injection hip replacement surgery.  She notes that she lives alone and will need her sister to come stay with her following surgery.  She will try to coordinate this for the near future.  In the meantime, she would like to try cortisone injection.  She is aware that she will need to wait approximately 2 months after injection before proceeding with surgery.  She will follow up with [place] when she is ready to schedule her surgery.  Follow-Up Instructions: Return if symptoms worsen or fail to improve.   Orders:  No orders of the defined types were placed in this encounter.  No orders of the defined types were placed in this encounter.     Procedures: No procedures performed   Clinical Data: No additional findings.   Subjective: Chief Complaint  Patient presents with  . Left Hip - Pain    Groin pain    HPI patient is a pleasant 70 year old female who comes in today with left hip pain.  The majority of her pain is to the groin but she does note occasional pain that radiates into the lower back.  The pain she has is worse with activity as well as when she is trying to go to sleep at night.  She has been taking Aleve without significantly for symptoms.  No paresthesias.  No previous injection to the left hip.  Review of Systems as detailed in HPI.  All others reviewed and are negative.   Objective: Vital Signs: There were no vitals taken for this visit.  Physical Exam well-developed well-nourished female no  acute distress.  Alert oriented x3.  Ortho Exam left hip exam shows a markedly positive logroll and FADIR.  Negative straight leg raise.  No pain with lumbar flexion or extension.  She does have mild left-sided paraspinous tenderness.  No spinous tenderness.  No focal weakness.  She is neurovascular intact distally.  Specialty Comments:  No specialty comments available.  Imaging: X-rays reviewed by me in [place] show advanced degenerative changes to the left hip.   PMFS History: Patient Active Problem List   Diagnosis Date Noted  . Degenerative joint disease (DJD) of hip 09/21/2020  . Left groin pain 09/14/2020  . Pain in joint, ankle and foot, left 08/19/2018  . Colon polyp 01/22/2018  . Estrogen deficiency 10/09/2016  . Elevated glucose level 10/09/2016  . Obesity 12/24/2014  . Hypertension 10/05/2014  . Breast cancer screening, high risk patient 10/18/2011  . Routine general medical examination at a health care facility 07/02/2011  . Encounter for routine gynecological examination 07/02/2011  . Irregular heart beat 04/09/2011  . HYPOKALEMIA 04/22/2009  . Vitamin D deficiency 02/24/2009  . STRESS REACTION, ACUTE, WITH EMOTIONAL DISTURBANCE 11/25/2007  . Osteopenia 01/14/2007  . EDEMA 01/14/2007   Past Medical History:  Diagnosis Date  . Anemia   . Arthritis   .  Bradycardia 9/12   mild - at one time with PACs/ asymptomatic   . Breast cancer screening, high risk patient 10/18/2011  . Cataracts, bilateral   . Colon polyp 12/2007  . Disorder of bone and cartilage, unspecified   . Edema   . Family history of malignant neoplasm of breast    evista for BRCA ppx  . Fibrocystic breast   . 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 Hypertension   . Hypopotassemia   . Neoplasm of uncertain behavior of skin   . Osteopenia   . Other screening mammogram   . Predominant disturbance of emotions   . Routine gynecological examination   .  Seasonal allergies   . Unspecified vitamin D deficiency     Family History  Problem Relation Age of Onset  . Breast cancer Mother   . Osteoporosis Mother   . Diabetes Father   . Skin cancer Father        ?  Marland Kitchen Osteopenia Sister   . Breast cancer Sister   . Breast cancer Other        cousin/Grandmother  . Heart attack Paternal Grandfather   . Colon cancer Neg Hx   . Esophageal cancer Neg Hx   . Rectal cancer Neg Hx   . Stomach cancer Neg Hx     Past Surgical History:  Procedure Laterality Date  . BREAST BIOPSY  01/2010  . BREAST BIOPSY  8/11   Fibrocystic change  . Breast MRI  01/2010   Normal  . cataracts  9/12   bilateral  . DEXA  12/2003   osteopenia  . DEXA  11/07   stable  . DEXA  1/10   Osteopenia-slightly improved  . LEEP     HPV  . procedure for "bad pap"    . TONSILLECTOMY     Social History   Occupational History  . Occupation: Social Services-works at [employer]  Tobacco Use  . Smoking status: Former Smoker    Quit date: 07/23/1985    Years since quitting: 35.2  . Smokeless tobacco: Never Used  . Tobacco comment: Quit "years ago"  Vaping Use  . Vaping Use: Never used  Substance and Sexual Activity  . Alcohol use: Yes    Alcohol/week: 0.0 standard drinks    Comment: Occasional  . Drug use: No  . Sexual activity: Yes

## 2020-09-30 ENCOUNTER — Telehealth: Payer: Self-pay | Admitting: Family Medicine

## 2020-09-30 NOTE — Telephone Encounter (Signed)
Patient called requesting a call back from [facility]. Patient states she has an upcoming appt to get cortisone injections and has questions. Please call patient at [phone] about this matter.

## 2020-10-05 ENCOUNTER — Ambulatory Visit: Payer: Self-pay

## 2020-10-05 ENCOUNTER — Other Ambulatory Visit: Payer: Self-pay

## 2020-10-05 ENCOUNTER — Encounter: Payer: Self-pay | Admitting: Family Medicine

## 2020-10-05 ENCOUNTER — Ambulatory Visit (INDEPENDENT_AMBULATORY_CARE_PROVIDER_SITE_OTHER): Payer: 59 | Admitting: Family Medicine

## 2020-10-05 DIAGNOSIS — M1612 Unilateral primary osteoarthritis, left hip: Secondary | ICD-10-CM | POA: Diagnosis not present

## 2020-10-05 NOTE — Progress Notes (Signed)
Subjective: Patient is here for ultrasound-guided intra-articular left hip injection.   Hoping to have replacement when retires this fall.  Objective:  Pain with IR.  Procedure: Ultrasound guided injection is preferred based studies that show increased duration, increased effect, greater accuracy, decreased procedural pain, increased response rate, and decreased cost with ultrasound guided versus blind injection.   Verbal informed consent obtained.  Time-out conducted.  Noted no overlying erythema, induration, or other signs of local infection. Ultrasound-guided left hip injection: After sterile prep with Betadine, injected 4 cc 0.25% bupivacaine without epinephrine and 6 mg betamethasone using a 22-gauge spinal needle, passing the needle through the iliofemoral ligament into the femoral head/neck junction.  Injectate seen filling joint capsule.

## 2020-10-21 ENCOUNTER — Encounter: Payer: Self-pay | Admitting: Family Medicine

## 2020-10-21 ENCOUNTER — Telehealth: Payer: Self-pay

## 2020-10-21 ENCOUNTER — Telehealth (INDEPENDENT_AMBULATORY_CARE_PROVIDER_SITE_OTHER): Payer: 59 | Admitting: Family Medicine

## 2020-10-21 ENCOUNTER — Other Ambulatory Visit: Payer: Self-pay

## 2020-10-21 DIAGNOSIS — H1032 Unspecified acute conjunctivitis, left eye: Secondary | ICD-10-CM

## 2020-10-21 MED ORDER — POLYMYXIN B-TRIMETHOPRIM 10000-0.1 UNIT/ML-% OP SOLN: 2.0000 [drp] | Freq: Four times a day (QID) | OPHTHALMIC | 0 refills | Status: DC

## 2020-10-21 NOTE — Telephone Encounter (Signed)
[facility] - Client TELEPHONE ADVICE RECORD [facility] Patient Name: Denise Ali Gender: Female DOB: 1951/06/21 Age: 70 Y 3 M 1 D Return Phone Number: [phone] (Primary) Address[place] [address] Client [facility] - Client Client Site [place], Roque Lias - MD Contact Type Call Who Is Calling Patient / Member / Family / Caregiver Call Type Triage / Clinical Relationship To Patient Self Return Phone Number [phone] (Primary) Chief Complaint Eye Pain Reason for Call Symptomatic / Request for [name] states she has a thick film over her left eye and it is causing pain. Caller states the eyelid itself is itchy as well and she would like to know what to do. Translation No Nurse Assessment Nurse: Harlow Mares, RN, Suanne Marker Date/Time [name] Time): 10/21/2020 12:39:07 PM Confirm and document reason for call. If symptomatic, describe symptoms. ---Caller states she has a thick film over her left eye and it is causing pain. Caller states the eyelid itself is itchy as well and she would like to know what to do. s/ s began yesterday. Does the patient have any new or worsening symptoms? ---Yes Will a triage be completed? ---Yes Related visit to physician within the last 2 weeks? ---No Does the PT have any chronic conditions? (i.e. diabetes, asthma, this includes High risk factors for pregnancy, etc.) ---Yes List chronic conditions. ---arthritis; HTN; Is this a behavioral health or substance abuse call? ---No Guidelines Guideline Title Affirmed Question Affirmed Notes Nurse Date/Time (Eastern Time) Eye Pain MODERATE eye pain or discomfort (e.g., interferes with normal activities or awakens from sleep; more than mild) Harlow Mares, RN, Suanne Marker 10/21/2020 12:42:20 PM PLEASE NOTE: All timestamps contained within  this report are represented as [place] Standard Time. CONFIDENTIALTY NOTICE: This fax transmission is intended only for the addressee. It contains information that is legally privileged, confidential or otherwise protected from use or disclosure. If you are not the intended recipient, you are strictly prohibited from reviewing, disclosing, copying using or disseminating any of this information or taking any action in reliance on or regarding this information. If you have received this fax in error, please notify us immediately by telephone so that we can arrange for its return to [place]. Phone: [phone], Toll-Free: [phone], Fax: [phone] Page: 2 of 4 Call Id: [id number] [place]. Time [name] Time) Disposition Final User 10/21/2020 12:47:59 PM See HCP within 4 Hours (or PCP triage) Yes Harlow Mares, RN, Rosalyn Charters Disagree/Comply Comply Caller Understands Yes PreDisposition Call Doctor Care Advice Given Per Guideline SEE HCP (OR PCP TRIAGE) WITHIN 4 HOURS: * IF OFFICE WILL BE OPEN: You need to be seen within the next 3 or 4 hours. Call your doctor (or NP/PA) now or as soon as the office opens. AVOID RUBBING: * Do not rub your eyes. * Rubbing your eyes irritates them more. Rubbing can also cause a corneal abrasion if a foreign body is present or worsen an existing abrasion. PAIN MEDICINES: * For pain relief, you can take either acetaminophen, ibuprofen, or naproxen. * They are over-the-counter (OTC) pain drugs. You can buy them at the drugstore. * ACETAMINOPHEN - REGULAR STRENGTH TYLENOL: Take 650 mg (two 325 mg pills) by mouth every 4 to 6 hours as needed. Each Regular Strength Tylenol pill has 325 mg of acetaminophen. The most you should take each day is 3,250 mg (10 pills a day). * ACETAMINOPHEN - EXTRA STRENGTH TYLENOL: Take 1,000  mg (two 500 mg pills) every 8 hours as needed. Each Extra Strength Tylenol pill has 500 mg of acetaminophen. The most you should take each day is 3,000 mg (6  pills a day). * IBUPROFEN (E.G., MOTRIN, ADVIL): Take 400 mg (two 200 mg pills) by mouth every 6 hours. The most you should take each day is 1,200 mg (six 200 mg pills), unless your doctor has told you to take more. * NAPROXEN (E.G., ALEVE): Take 220 mg (one 220 mg pill) by mouth every 8 to 12 hours as needed. You may take 440 mg (two 220 mg pills) for your first dose. The most you should take each day is 660 mg (three 220 mg pills a day), unless your doctor has told you to take more. CALL BACK IF: * You become worse CARE ADVICE given per Eye Pain (Adult) guideline. Comments User: [name], RN Date/Time [name] Time): 10/21/2020 12:47:23 PM No answer at backline. User: [name], RN Date/Time [name] Time): 10/21/2020 12:49:15 PM Advised caller if no available appts at the office; proceed to [facility]. Caller voiced understanding. Referrals REFERRED TO PCP OFFICE Triage Details User: Harlow Mares RN, Rhonda Date/Time: 10/21/2020 12:42:20 PM Triage Guideline: Eye Pain Followed an eye injury -----No Eye pain from chemical in the eye PLEASE NOTE: All timestamps contained within this report are represented as [place] Standard Time. CONFIDENTIALTY NOTICE: This fax transmission is intended only for the addressee. It contains information that is legally privileged, confidential or otherwise protected from use or disclosure. If you are not the intended recipient, you are strictly prohibited from reviewing, disclosing, copying using or disseminating any of this information or taking any action in reliance on or regarding this information. If you have received this fax in error, please notify us immediately by telephone so that we can arrange for its return to [place]. Phone: [phone], Toll-Free: [phone], Fax: [phone] Page: 3 of 4 Call Id: [id number] Triage Details -----No Eye pain from foreign body in eye -----No [1] Tender, red lump or pimple AND [2] located along the eyelid  margin -----No Has sinus pain or pressure -----No SEVERE eye pain -----No Complete loss of vision in one or both eyes -----No [1] Eyelids are very swollen (shut or almost) AND [2] fever -----No [1] Eyelid (outer) is very red AND [2] fever -----No [1] Foreign body sensation ("feels like something is in there") AND [2] irrigation didn't help -----No Vomiting -----No Ulcer or sore seen on the cornea (clear center part of the eye) -----No [1] Recent eye surgery AND [2] increasing eye pain -----No [1] Blurred vision AND [2] new or worsening -----No Patient sounds very sick or weak to the triager -----No MODERATE eye pain or discomfort (e.g., interferes with normal activities or awakens from sleep; more than mild) -----Yes Disposition: See HCP within 4 Hours (or PCP triage) SEE HCP (OR PCP TRIAGE) WITHIN 4 HOURS: PLEASE NOTE: All timestamps contained within this report are represented as [place] Standard Time. CONFIDENTIALTY NOTICE: This fax transmission is intended only for the addressee. It contains information that is legally privileged, confidential or otherwise protected from use or disclosure. If you are not the intended recipient, you are strictly prohibited from reviewing, disclosing, copying using or disseminating any of this information or taking any action in reliance on or regarding this information. If you have received this fax in error, please notify us immediately by telephone so that we can arrange for its return to [place]. Phone: [phone], Toll-Free: [phone], Fax: [phone] Page: 4 of 4  Call Id: [id number] Triage Details * IF OFFICE WILL BE OPEN: You need to be seen within the next 3 or 4 hours. Call your doctor (or NP/PA) now or as soon as the office opens. AVOID RUBBING: * Do not rub your eyes. * Rubbing your eyes irritates them more. Rubbing can also cause a corneal abrasion if a foreign body is present or worsen an existing abrasion. PAIN MEDICINES: *  For pain relief, you can take either acetaminophen, ibuprofen, or naproxen. * They are over-the-counter (OTC) pain drugs. You can buy them at the drugstore. * ACETAMINOPHEN - REGULAR STRENGTH TYLENOL: Take 650 mg (two 325 mg pills) by mouth every 4 to 6 hours as needed. Each Regular Strength Tylenol pill has 325 mg of acetaminophen. The most you should take each day is 3,250 mg (10 pills a day). * ACETAMINOPHEN - EXTRA STRENGTH TYLENOL: Take 1,000 mg (two 500 mg pills) every 8 hours as needed. Each Extra Strength Tylenol pill has 500 mg of acetaminophen. The most you should take each day is 3,000 mg (6 pills a day). * IBUPROFEN (E.G., MOTRIN, ADVIL): Take 400 mg (two 200 mg pills) by mouth every 6 hours. The most you should take each day is 1,200 mg (six 200 mg pills), unless your doctor has told you to take more. * NAPROXEN (E.G., ALEVE): Take 220 mg (one 220 mg pill) by mouth every 8 to 12 hours as needed. You may take 440 mg (two 220 mg pills) for your first dose. The most you should take each day is 660 mg (three 220 mg pills a day), unless your doctor has told you to take more. CALL BACK IF: * You become worse CARE ADVICE given per Eye Pain (Adult) guideline.

## 2020-10-21 NOTE — Progress Notes (Signed)
Chief Complaint  Patient presents with  . Eye Problem    Left eye problem.  Itches and painful    Denise Ali is here for left eye irritation. Due to COVID-19 pandemic, we are interacting via telephone. I verified patient's ID using 2 identifiers. Patient agreed to proceed with visit via this method. Patient is at home, I am at office. Patient and I are present for visit.   Duration: 1 day Chemical exposure? No  Recent URI? No  Contact lenses? No  History of allergies? Yes Treatment to date: none  Past Medical History:  Diagnosis Date  . Anemia   . Arthritis   . Bradycardia 9/12   mild - at one time with PACs/ asymptomatic   . Breast cancer screening, high risk patient 10/18/2011  . Cataracts, bilateral   . Colon polyp 12/2007  . Disorder of bone and cartilage, unspecified   . Edema   . Family history of malignant neoplasm of breast    evista for BRCA ppx  . Fibrocystic breast   . 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 Hypertension   . Hypopotassemia   . Neoplasm of uncertain behavior of skin   . Osteopenia   . Other screening mammogram   . Predominant disturbance of emotions   . Routine gynecological examination   . Seasonal allergies   . Unspecified vitamin D deficiency    Objective No conversational dyspnea Age appropriate judgment and insight Nml affect and mood  Acute conjunctivitis of left eye, unspecified acute conjunctivitis type - Plan: trimethoprim-polymyxin b (POLYTRIM) ophthalmic solution  Orders as above. Instructed to practice good hand hygiene and try not to touch face. Warm compresses and artificial tears also recommended. F/u if no improvement in 7-10 days. Total time: 11 min Pt voiced understanding and agreement to the plan.  [place], DO 10/21/20 2:12 PM

## 2020-10-21 NOTE — Telephone Encounter (Signed)
Per chart review tab pt has already had visit with Dr Nani Ravens. Sending note to Dr Glori Bickers as Juluis Rainier.

## 2020-11-17 ENCOUNTER — Telehealth: Payer: Self-pay | Admitting: Orthopaedic Surgery

## 2020-11-17 NOTE — Telephone Encounter (Signed)
Pt called stating she has gotten cortisone injections in her hip and she's pretty sure she's going to have surgery on it eventually. Pt would like to do the surgery sooner rather than later, so she would like a CB to let her know what she needs to do to proceed with getting scheduled.   [phone]

## 2020-11-17 NOTE — Telephone Encounter (Signed)
I had a conversation on the phone with Ms. [employer].  She states that the cortisone injection she had on March 16 helped but she can tell that the relief is already starting to wear off.  She has decided that she would like to move forward with a left total hip replacement sometime in June.  I explained the preoperative process as well as surgery itself and the rehab and recovery and anticipated time out of work.  All of her questions were answered to her satisfaction.  She would like Debbie to reach out to her to schedule a left hip total hip replacement for June.  I will leave a surgery sheet for Debbie.

## 2020-11-24 ENCOUNTER — Other Ambulatory Visit: Payer: Self-pay

## 2020-12-23 ENCOUNTER — Ambulatory Visit (INDEPENDENT_AMBULATORY_CARE_PROVIDER_SITE_OTHER): Payer: 59 | Admitting: Family Medicine

## 2020-12-23 ENCOUNTER — Other Ambulatory Visit: Payer: Self-pay

## 2020-12-23 ENCOUNTER — Encounter: Payer: Self-pay | Admitting: Family Medicine

## 2020-12-23 VITALS — BP 146/82 | HR 66 | Temp 97.9°F | Ht 64.0 in | Wt 219.0 lb

## 2020-12-23 DIAGNOSIS — W57XXXA Bitten or stung by nonvenomous insect and other nonvenomous arthropods, initial encounter: Secondary | ICD-10-CM

## 2020-12-23 DIAGNOSIS — S30861A Insect bite (nonvenomous) of abdominal wall, initial encounter: Secondary | ICD-10-CM

## 2020-12-23 DIAGNOSIS — S40261A Insect bite (nonvenomous) of right shoulder, initial encounter: Secondary | ICD-10-CM | POA: Diagnosis not present

## 2020-12-23 DIAGNOSIS — S20362A Insect bite (nonvenomous) of left front wall of thorax, initial encounter: Secondary | ICD-10-CM | POA: Diagnosis not present

## 2020-12-23 DIAGNOSIS — S20469A Insect bite (nonvenomous) of unspecified back wall of thorax, initial encounter: Secondary | ICD-10-CM | POA: Diagnosis not present

## 2020-12-23 MED ORDER — TRIAMCINOLONE ACETONIDE 0.5 % EX CREA: 1.0000 "application " | TOPICAL_CREAM | Freq: Two times a day (BID) | CUTANEOUS | 0 refills | Status: DC

## 2020-12-23 NOTE — Patient Instructions (Signed)
Tick Bite Information, Adult Ticks are insects that draw blood for food. Most ticks live in shrubs and grassy and wooded areas. They climb onto people and animals that brush against the leaves and grasses that they rest on. Then they bite, attaching themselves to the skin. Most ticks are harmless, but some ticks may carry germs that can spread to a person through a bite and cause a disease. To reduce your risk of getting a disease from a tick bite, make sure you:  Take steps to prevent tick bites.  Check for ticks after being outdoors where ticks live.  Watch for symptoms of disease if a tick attached to you or if you suspect a tick bite. How can I prevent tick bites? Take these steps to help prevent tick bites when you go outdoors in an area where ticks live: Use insect repellent  Use insect repellent that has DEET (20% or higher), picaridin, or IR3535 in it. Follow the instructions on the label. Use these products on: ? Bare skin. ? The top of your boots. ? Your pant legs. ? Your sleeve cuffs.  For insect repellent that contains permethrin, follow the instructions on the label. Use these products on: ? Clothing. ? Boots. ? Outdoor gear. ? Tents. When you are outside  Wear protective clothing. Long sleeves and long pants offer the best protection from ticks.  Wear light-colored clothing so you can see ticks more easily.  Tuck your pant legs into your socks.  If you go walking on a trail, stay in the middle of the trail so your skin, hair, and clothing do not touch the bushes.  Avoid walking through areas with long grass.  Check for ticks on your clothing, hair, and skin often while you are outside, and check again before you go inside. Make sure to check the scalp, neck, armpits, waist, groin, and joint areas. These are the spots where ticks attach themselves most often. When you go indoors  Check your clothing for ticks. Tumble dry clothes in a dryer on high heat for at least  10 minutes. If clothes are damp, additional time may be needed. If clothes require washing, use hot water.  Examine gear and pets.  Shower soon after being outdoors.  Check your body for ticks. Conduct a full body check using a mirror. What is the proper way to remove a tick? If you find a tick on your body, remove it as soon as possible. Removing a tick sooner can prevent germs from passing to your body. Do not remove the tick with your bare fingers. To remove a tick that is crawling on your skin but has not bitten, use either of these methods:  Go outdoors and brush the tick off.  Remove the tick with tape or a lint roller. To remove a tick that is attached to your skin: 1. Wash your hands. If you have latex gloves, put them on. 2. Use fine-tipped tweezers, curved forceps, or a tick-removal tool to gently grasp the tick as close to your skin and the tick's head as possible. 3. Gently pull with a steady, upward, even pressure until the tick lets go. 4. When removing the tick: ? Take care to keep the tick's head attached to its body. ? Do not twist or jerk the tick. This can make the tick's head or mouth parts break off and remain in the skin. ? Do not squeeze or crush the tick's body. This could force disease-carrying fluids from the tick   into your body. Do not try to remove a tick with heat, alcohol, petroleum jelly, or fingernail polish. Using these methods can cause the tick to salivate and regurgitate into your bloodstream, increasing your risk of getting a disease.   What should I do after removing a tick?  Dispose of the tick. Do not crush a tick with your fingers.  Clean the bite area and your hands with soap and water, rubbing alcohol, or an iodine scrub.  If an antiseptic cream or ointment is available, apply a small amount to the bite site.  Wash and disinfect any instruments that you used to remove the tick. How should I dispose of a tick? To dispose of a live tick, use  one of these methods:  Place it in rubbing alcohol.  Place it in a sealed bag or container.  Wrap it tightly in tape.  Flush it down the toilet. Contact a health care provider if:  You have symptoms of a disease after a tick bite. Symptoms of a tick-borne disease can occur from moments after the tick bites to 30 days after a tick is removed. Symptoms include: ? Fever or chills. ? Any of these signs in the bite area:  A red rash that makes a circle (bull's-eye rash) in the bite area.  Redness and swelling. ? Headache. ? Muscle, joint, or bone pain. ? Abnormal tiredness. ? Numbness in your legs or difficulty walking or moving your legs. ? Tender, swollen lymph glands.  A part of a tick breaks off and gets stuck in your skin. Get help right away if:  You are not able to remove a tick.  You experience muscle weakness or paralysis.  Your symptoms get worse or you experience new symptoms.  You find an engorged tick on your skin and you are in an area where disease from ticks is a high risk. Summary  Ticks may carry germs that can spread to a person through a bite and cause a disease.  Wear protective clothing and use insect repellent to prevent tick bites. Follow the instructions on the label.  If you find a tick on your body, remove it as soon as possible. If the tick is attached, do not try to remove with heat, alcohol, petroleum jelly, or fingernail polish.  Remove the attached tick using fine-tipped tweezers, curved forceps, or a tick-removal tool. Gently pull with steady, upward, even pressure until the tick lets go. Do not twist or jerk the tick. Do not squeeze or crush the tick's body.  If you have symptoms of a disease after being bitten by a tick, contact a health care provider. This information is not intended to replace advice given to you by your health care provider. Make sure you discuss any questions you have with your health care provider. Document Revised:  07/06/2019 Document Reviewed: 07/06/2019 [employer] Patient Education  2021 [employer]  

## 2020-12-23 NOTE — Progress Notes (Signed)
Patient ID: Denise Ali, female    DOB: June 09, 1951, 70 y.o.   MRN: [id number]  This visit was conducted in person.  BP (!) 146/82   Pulse 66   Temp 97.9 F (36.6 C) (Temporal)   Ht 5\' 4"  (1.626 m)   Wt 219 lb (99.3 kg)   SpO2 99%   BMI 37.59 kg/m    CC:  Chief Complaint  Patient presents with   Tick Removal    Patient states about 5 tick bites     Subjective:   HPI: Denise Ali is a 70 y.o. female presenting on 12/23/2020 for Tick Removal (Patient states about 5 tick bites/)   She has noted 5 ticks to be attached in last 2 weeks... first noted on 12/10/2020 on left abdominal wall..  Had red rash, warm, itchy.. seen at urgent care.  Since then noted 4 more bites, ticks removed from upper back , right shoulder, left groin and left torso.  She has a dog, that she walks.  Less redness in right abdominal bite... lumps at other sites, all itchy.   No fever.  2 days ago slight headache. No myalgia, no fever, she tired but this is not unusual.  No neck stiffness.  No additional rash, target lesion.     Relevant past medical, surgical, family and social history reviewed and updated as indicated. Interim medical history since our last visit reviewed. Allergies and medications reviewed and updated. Outpatient Medications Prior to Visit  Medication Sig Dispense Refill   Calcium 1200-1000 MG-UNIT CHEW Chew 1 tablet by mouth daily.     cholecalciferol (VITAMIN D) 1000 UNITS tablet Take 2,000 Units by mouth daily.      doxycycline (VIBRA-TABS) 100 MG tablet Take 100 mg by mouth 2 (two) times daily.     hydrochlorothiazide (HYDRODIURIL) 25 MG tablet Take 1 tablet (25 mg total) by mouth daily. 90 tablet 3   naproxen sodium (ALEVE) 220 MG tablet Take 220 mg by mouth.     trimethoprim-polymyxin b (POLYTRIM) ophthalmic solution Place 2 drops into the left eye every 6 (six) hours. 10 mL 0   No facility-administered medications prior to visit.     Per HPI unless  specifically indicated in ROS section below Review of Systems  Constitutional:  Negative for fatigue and fever.  HENT:  Negative for ear pain.   Eyes:  Negative for pain.  Respiratory:  Negative for chest tightness and shortness of breath.   Cardiovascular:  Negative for chest pain, palpitations and leg swelling.  Gastrointestinal:  Negative for abdominal pain.  Genitourinary:  Negative for dysuria.  Objective:  BP (!) 146/82   Pulse 66   Temp 97.9 F (36.6 C) (Temporal)   Ht 5\' 4"  (1.626 m)   Wt 219 lb (99.3 kg)   SpO2 99%   BMI 37.59 kg/m   Wt Readings from Last 3 Encounters:  12/23/20 219 lb (99.3 kg)  09/14/20 223 lb 6 oz (101.3 kg)  05/12/19 226 lb 8 oz (102.7 kg)      Physical Exam Constitutional:      General: She is not in acute distress.    Appearance: Normal appearance. She is well-developed. She is not ill-appearing or toxic-appearing.  HENT:     Head: Normocephalic.     Right Ear: Hearing, tympanic membrane, ear canal and external ear normal. Tympanic membrane is not erythematous, retracted or bulging.     Left Ear: Hearing, tympanic membrane, ear canal and external ear  normal. Tympanic membrane is not erythematous, retracted or bulging.     Nose: No mucosal edema or rhinorrhea.     Right Sinus: No maxillary sinus tenderness or frontal sinus tenderness.     Left Sinus: No maxillary sinus tenderness or frontal sinus tenderness.     Mouth/Throat:     Pharynx: Uvula midline.  Eyes:     General: Lids are normal. Lids are everted, no foreign bodies appreciated.     Conjunctiva/sclera: Conjunctivae normal.     Pupils: Pupils are equal, round, and reactive to light.  Neck:     Thyroid: No thyroid mass or thyromegaly.     Vascular: No carotid bruit.     Trachea: Trachea normal.  Cardiovascular:     Rate and Rhythm: Normal rate and regular rhythm.     Pulses: Normal pulses.     Heart sounds: Normal heart sounds, S1 normal and S2 normal. No murmur heard.   No  friction rub. No gallop.  Pulmonary:     Effort: Pulmonary effort is normal. No tachypnea or respiratory distress.     Breath sounds: Normal breath sounds. No decreased breath sounds, wheezing, rhonchi or rales.  Abdominal:     General: Bowel sounds are normal.     Palpations: Abdomen is soft.     Tenderness: There is no abdominal tenderness.  Musculoskeletal:     Cervical back: Normal range of motion and neck supple.  Skin:    General: Skin is warm and dry.     Findings: No rash.     Comments: Several small nodules, slight erythema at site of bites.  Neurological:     Mental Status: She is alert.  Psychiatric:        Mood and Affect: Mood is not anxious or depressed.        Speech: Speech normal.        Behavior: Behavior normal. Behavior is cooperative.        Thought Content: Thought content normal.        Judgment: Judgment normal.      Results for orders placed or performed in visit on 09/07/20  VITAMIN D 25 Hydroxy (Vit-D Deficiency, Fractures)  Result Value Ref Range   VITD 42.01 30.00 - 100.00 ng/mL  TSH  Result Value Ref Range   TSH 2.92 0.35 - 4.50 uIU/mL  Hemoglobin A1c  Result Value Ref Range   Hgb A1c MFr Bld 5.9 4.6 - 6.5 %  Lipid panel  Result Value Ref Range   Cholesterol 185 0 - 200 mg/dL   Triglycerides 88.0 0.0 - 149.0 mg/dL   HDL 68.50 >39.00 mg/dL   VLDL 17.6 0.0 - 40.0 mg/dL   LDL Cholesterol 99 0 - 99 mg/dL   Total CHOL/HDL Ratio 3    NonHDL 116.15   Comprehensive metabolic panel  Result Value Ref Range   Sodium 134 (L) 135 - 145 mEq/L   Potassium 3.7 3.5 - 5.1 mEq/L   Chloride 99 96 - 112 mEq/L   CO2 29 19 - 32 mEq/L   Glucose, Bld 86 70 - 99 mg/dL   BUN 19 6 - 23 mg/dL   Creatinine, Ser 0.73 0.40 - 1.20 mg/dL   Total Bilirubin 0.7 0.2 - 1.2 mg/dL   Alkaline Phosphatase 69 39 - 117 U/L   AST 18 0 - 37 U/L   ALT 15 0 - 35 U/L   Total Protein 7.2 6.0 - 8.3 g/dL   Albumin 4.2 3.5 - 5.2 g/dL  GFR 84.08 >60.00 mL/min   Calcium 10.5 8.4 -  10.5 mg/dL  CBC with Differential/Platelet  Result Value Ref Range   WBC 7.7 4.0 - 10.5 K/uL   RBC 4.35 3.87 - 5.11 Mil/uL   Hemoglobin 13.4 12.0 - 15.0 g/dL   HCT 39.5 36.0 - 46.0 %   MCV 90.6 78.0 - 100.0 fl   MCHC 34.0 30.0 - 36.0 g/dL   RDW 14.0 11.5 - 15.5 %   Platelets 251.0 150.0 - 400.0 K/uL   Neutrophils Relative % 56.5 43.0 - 77.0 %   Lymphocytes Relative 30.6 12.0 - 46.0 %   Monocytes Relative 8.9 3.0 - 12.0 %   Eosinophils Relative 2.1 0.0 - 5.0 %   Basophils Relative 1.9 0.0 - 3.0 %   Neutro Abs 4.4 1.4 - 7.7 K/uL   Lymphs Abs 2.4 0.7 - 4.0 K/uL   Monocytes Absolute 0.7 0.1 - 1.0 K/uL   Eosinophils Absolute 0.2 0.0 - 0.7 K/uL   Basophils Absolute 0.1 0.0 - 0.1 K/uL    This visit occurred during the SARS-CoV-2 public health emergency.  Safety protocols were in place, including screening questions prior to the visit, additional usage of staff PPE, and extensive cleaning of exam room while observing appropriate contact time as indicated for disinfecting solutions.   COVID 19 screen:  No recent travel or known exposure to COVID19 The patient denies respiratory symptoms of COVID 19 at this time. The importance of social distancing was discussed today.   Assessment and Plan    Problem List Items Addressed This Visit     Tick bite - Primary    Local allergic reaction at sites... treat with topical steroid.  No S/S of tick borne illness... return precautions given.        Eliezer Lofts, MD

## 2020-12-29 ENCOUNTER — Other Ambulatory Visit (HOSPITAL_COMMUNITY): Payer: 59

## 2021-01-04 ENCOUNTER — Other Ambulatory Visit: Payer: Self-pay

## 2021-01-04 ENCOUNTER — Other Ambulatory Visit: Payer: Self-pay | Admitting: Physician Assistant

## 2021-01-04 MED ORDER — ASPIRIN EC 81 MG PO TBEC: 81.0000 mg | DELAYED_RELEASE_TABLET | Freq: Two times a day (BID) | ORAL | 0 refills | Status: DC

## 2021-01-04 MED ORDER — METHOCARBAMOL 500 MG PO TABS: 500.0000 mg | ORAL_TABLET | Freq: Two times a day (BID) | ORAL | 0 refills | Status: DC | PRN

## 2021-01-04 MED ORDER — OXYCODONE-ACETAMINOPHEN 5-325 MG PO TABS: 1.0000 | ORAL_TABLET | Freq: Four times a day (QID) | ORAL | 0 refills | Status: DC | PRN

## 2021-01-04 MED ORDER — DOCUSATE SODIUM 100 MG PO CAPS: 100.0000 mg | ORAL_CAPSULE | Freq: Every day | ORAL | 2 refills | Status: DC | PRN

## 2021-01-04 MED ORDER — ONDANSETRON HCL 4 MG PO TABS: 4.0000 mg | ORAL_TABLET | Freq: Three times a day (TID) | ORAL | 0 refills | Status: DC | PRN

## 2021-01-04 NOTE — Progress Notes (Signed)
Surgical Instructions    Your procedure is scheduled on Monday June 20th.  Report to [facility] Main Entrance "A" at 10:50 A.M., then check in with the Admitting office.  Call this number if you have problems the morning of surgery:  [phone]   If you have any questions prior to your surgery date call [phone]: Open Monday-Friday 8am-4pm    Remember:  Do not eat after midnight the night before your surgery  You may drink clear liquids until 9:50am the morning of your surgery.   Clear liquids allowed are: Water, Non-Citrus Juices (without pulp), Carbonated Beverages, Clear Tea, Black Coffee Only, and Gatorade   Enhanced Recovery after Surgery for Orthopedics Enhanced Recovery after Surgery is a protocol used to improve the stress on your body and your recovery after surgery.  Patient Instructions  The day of surgery (if you do NOT have diabetes):  Drink ONE (1) Pre-Surgery Clear Ensure by ___9:50__ am the morning of surgery   This drink was given to you during your hospital  pre-op appointment visit. Nothing else to drink after completing the  Pre-Surgery Clear Ensure.         If you have questions, please contact your surgeon's office.      Take these medicines the morning of surgery with A SIP OF WATER  NONE     As of today, STOP taking any Aspirin (unless otherwise instructed by your surgeon) Aleve, Naproxen, Ibuprofen, Motrin, Advil, Goody's, BC's, all herbal medications, fish oil, and all vitamins.          Do not wear jewelry or makeup Do not wear lotions, powders, perfumes, or deodorant. Do not shave 48 hours prior to surgery.   Do not bring valuables to the hospital. DO Not wear nail polish, gel polish, artificial nails, or any other type of covering on  natural nails including finger and toenails. If patients have artificial nails, gel coating, etc. that need to be removed by a nail salon please have this removed prior to surgery or surgery may need to  be canceled/delayed if the surgeon/ anesthesia feels like the patient is unable to be adequately monitored.             [facility] is not responsible for any belongings or valuables.  Do NOT Smoke (Tobacco/Vaping) or drink Alcohol 24 hours prior to your procedure If you use a CPAP at night, you may bring all equipment for your overnight stay.   Contacts, glasses, dentures or bridgework may not be worn into surgery, please bring cases for these belongings   For patients admitted to the hospital, discharge time will be determined by your treatment team.   Patients discharged the day of surgery will not be allowed to drive home, and someone needs to stay with them for 24 hours.  ONLY 1 SUPPORT PERSON MAY BE PRESENT WHILE YOU ARE IN SURGERY. IF YOU ARE TO BE ADMITTED ONCE YOU ARE IN YOUR ROOM YOU WILL BE ALLOWED TWO (2) VISITORS.  Minor children may have two parents present. Special consideration for safety and communication needs will be reviewed on a case by case basis.  Special instructions:    Oral Hygiene is also important to reduce your risk of infection.  Remember - BRUSH YOUR TEETH THE MORNING OF SURGERY WITH YOUR REGULAR TOOTHPASTE   [facility]- Preparing For Surgery  Before surgery, you can play an important role. Because skin is not sterile, your skin needs to be as free of germs as  possible. You can reduce the number of germs on your skin by washing with CHG (chlorahexidine gluconate) Soap before surgery.  CHG is an antiseptic cleaner which kills germs and bonds with the skin to continue killing germs even after washing.     Please do not use if you have an allergy to CHG or antibacterial soaps. If your skin becomes reddened/irritated stop using the CHG.  Do not shave (including legs and underarms) for at least 48 hours prior to first CHG shower. It is OK to shave your face.  Please follow these instructions carefully.     Shower the NIGHT BEFORE SURGERY and the MORNING OF  SURGERY with CHG Soap.   If you chose to wash your hair, wash your hair first as usual with your normal shampoo. After you shampoo, rinse your hair and body thoroughly to remove the shampoo.  Then [employer] and genitals (private parts) with your normal soap and rinse thoroughly to remove soap.  After that Use CHG Soap as you would any other liquid soap. You can apply CHG directly to the skin and wash gently with a scrungie or a clean washcloth.   Apply the CHG Soap to your body ONLY FROM THE NECK DOWN.  Do not use on open wounds or open sores. Avoid contact with your eyes, ears, mouth and genitals (private parts). Wash Face and genitals (private parts)  with your normal soap.   Wash thoroughly, paying special attention to the area where your surgery will be performed.  Thoroughly rinse your body with warm water from the neck down.  DO NOT shower/wash with your normal soap after using and rinsing off the CHG Soap.  Pat yourself dry with a CLEAN TOWEL.  Wear CLEAN PAJAMAS to bed the night before surgery  Place CLEAN SHEETS on your bed the night before your surgery  DO NOT SLEEP WITH PETS.   Day of Surgery:  Take a shower with CHG soap. Wear Clean/Comfortable clothing the morning of surgery Do not apply any deodorants/lotions.   Remember to brush your teeth WITH YOUR REGULAR TOOTHPASTE.   Please read over the following fact sheets that you were given.

## 2021-01-05 ENCOUNTER — Encounter (HOSPITAL_COMMUNITY): Payer: Self-pay

## 2021-01-05 ENCOUNTER — Encounter (HOSPITAL_COMMUNITY)
Admission: RE | Admit: 2021-01-05 | Discharge: 2021-01-05 | Disposition: A | Discharge status: Home / Self Care | Payer: 59 | Source: Ambulatory Visit | Attending: Orthopaedic Surgery | Admitting: Orthopaedic Surgery

## 2021-01-05 ENCOUNTER — Other Ambulatory Visit (HOSPITAL_COMMUNITY): Payer: 59

## 2021-01-05 ENCOUNTER — Other Ambulatory Visit: Payer: Self-pay

## 2021-01-05 ENCOUNTER — Telehealth: Payer: Self-pay | Admitting: Orthopaedic Surgery

## 2021-01-05 ENCOUNTER — Encounter (HOSPITAL_COMMUNITY)
Admission: RE | Admit: 2021-01-05 | Discharge: 2021-01-05 | Disposition: A | Discharge status: Home / Self Care | Payer: 59 | Source: Ambulatory Visit | Attending: Physician Assistant | Admitting: Physician Assistant

## 2021-01-05 DIAGNOSIS — Z20822 Contact with and (suspected) exposure to covid-19: Secondary | ICD-10-CM | POA: Insufficient documentation

## 2021-01-05 DIAGNOSIS — M1612 Unilateral primary osteoarthritis, left hip: Secondary | ICD-10-CM | POA: Insufficient documentation

## 2021-01-05 DIAGNOSIS — Z01818 Encounter for other preprocedural examination: Secondary | ICD-10-CM | POA: Insufficient documentation

## 2021-01-05 HISTORY — DX: Pneumonia, unspecified organism: J18.9

## 2021-01-05 LAB — CBC WITH DIFFERENTIAL/PLATELET
Abs Immature Granulocytes: 0.02 10*3/uL (ref 0.00–0.07)
Basophils Absolute: 0 10*3/uL (ref 0.0–0.1)
Basophils Relative: 1 %
Eosinophils Absolute: 0.1 10*3/uL (ref 0.0–0.5)
Eosinophils Relative: 2 %
HCT: 39.5 % (ref 36.0–46.0)
Hemoglobin: 13 g/dL (ref 12.0–15.0)
Immature Granulocytes: 0 %
Lymphocytes Relative: 34 %
Lymphs Abs: 1.7 10*3/uL (ref 0.7–4.0)
MCH: 30.4 pg (ref 26.0–34.0)
MCHC: 32.9 g/dL (ref 30.0–36.0)
MCV: 92.5 fL (ref 80.0–100.0)
Monocytes Absolute: 0.6 10*3/uL (ref 0.1–1.0)
Monocytes Relative: 13 %
Neutro Abs: 2.5 10*3/uL (ref 1.7–7.7)
Neutrophils Relative %: 50 %
Platelets: 220 10*3/uL (ref 150–400)
RBC: 4.27 MIL/uL (ref 3.87–5.11)
RDW: 13.2 % (ref 11.5–15.5)
WBC: 4.9 10*3/uL (ref 4.0–10.5)
nRBC: 0 % (ref 0.0–0.2)

## 2021-01-05 LAB — COMPREHENSIVE METABOLIC PANEL
ALT: 17 U/L (ref 0–44)
AST: 22 U/L (ref 15–41)
Albumin: 3.8 g/dL (ref 3.5–5.0)
Alkaline Phosphatase: 51 U/L (ref 38–126)
Anion gap: 6 (ref 5–15)
BUN: 15 mg/dL (ref 8–23)
CO2: 30 mmol/L (ref 22–32)
Calcium: 10.5 mg/dL — ABNORMAL HIGH (ref 8.9–10.3)
Chloride: 101 mmol/L (ref 98–111)
Creatinine, Ser: 0.72 mg/dL (ref 0.44–1.00)
GFR, Estimated: >60 mL/min (ref >60)
Glucose, Bld: 108 mg/dL — ABNORMAL HIGH (ref 70–99)
Potassium: 3.4 mmol/L — ABNORMAL LOW (ref 3.5–5.1)
Sodium: 137 mmol/L (ref 135–145)
Total Bilirubin: 1 mg/dL (ref 0.3–1.2)
Total Protein: 6.9 g/dL (ref 6.5–8.1)

## 2021-01-05 LAB — URINALYSIS, ROUTINE W REFLEX MICROSCOPIC
Bilirubin Urine: NEGATIVE
Glucose, UA: NEGATIVE mg/dL
Hgb urine dipstick: NEGATIVE
Ketones, ur: NEGATIVE mg/dL
Leukocytes,Ua: NEGATIVE
Nitrite: NEGATIVE
Protein, ur: NEGATIVE mg/dL
Specific Gravity, Urine: 1.014 (ref 1.005–1.030)
pH: 7 (ref 5.0–8.0)

## 2021-01-05 LAB — TYPE AND SCREEN
ABO/RH(D): A NEG
Antibody Screen: NEGATIVE

## 2021-01-05 LAB — SURGICAL PCR SCREEN
MRSA, PCR: NEGATIVE
Staphylococcus aureus: NEGATIVE

## 2021-01-05 LAB — PROTIME-INR
INR: 1.1 (ref 0.8–1.2)
Prothrombin Time: 13.8 seconds (ref 11.4–15.2)

## 2021-01-05 LAB — APTT: aPTT: 30 seconds (ref 24–36)

## 2021-01-05 LAB — SARS CORONAVIRUS 2 (TAT 6-24 HRS): SARS Coronavirus 2: NEGATIVE

## 2021-01-05 NOTE — Telephone Encounter (Signed)
Received $50.00 check, 2 medical records release forms and FMLA/Disability forms from patient      Forwarding to [facility] today

## 2021-01-05 NOTE — Progress Notes (Signed)
PCP - Dr. Loura Pardon Cardiologist - Denies  Chest x-ray - 01/05/21 EKG - 01/05/21 Stress Test - Denies ECHO - Denies Cardiac Cath -Denies   Sleep Study - Denies  DM - Denies  ERAS Protcol -Yes PRE-SURGERY Ensure given   COVID TEST- 01/05/21   Anesthesia review: No  Patient denies shortness of breath, fever, cough and chest pain at PAT appointment   All instructions explained to the patient, with a verbal understanding of the material. Patient agrees to go over the instructions while at home for a better understanding. Patient also instructed to wear a mask in public after being tested for COVID-19. The opportunity to ask questions was provided.

## 2021-01-06 MED ORDER — TRANEXAMIC ACID 1000 MG/10ML IV SOLN
2000.0000 mg | INTRAVENOUS | Status: DC
  Filled 2021-01-06 (×2): qty 20

## 2021-01-09 ENCOUNTER — Encounter (HOSPITAL_COMMUNITY)
Admission: RE | Disposition: A | Discharge status: Home / Self Care | Payer: Self-pay | Source: Home / Self Care | Attending: Orthopaedic Surgery

## 2021-01-09 ENCOUNTER — Encounter (HOSPITAL_COMMUNITY): Payer: Self-pay | Admitting: Orthopaedic Surgery

## 2021-01-09 ENCOUNTER — Ambulatory Visit (HOSPITAL_COMMUNITY): Payer: 59

## 2021-01-09 ENCOUNTER — Other Ambulatory Visit: Payer: Self-pay

## 2021-01-09 ENCOUNTER — Ambulatory Visit (HOSPITAL_COMMUNITY): Payer: 59 | Admitting: Certified Registered Nurse Anesthetist

## 2021-01-09 ENCOUNTER — Observation Stay (HOSPITAL_COMMUNITY): Payer: 59

## 2021-01-09 ENCOUNTER — Observation Stay (HOSPITAL_COMMUNITY)
Admission: RE | Admit: 2021-01-09 | Discharge: 2021-01-10 | Disposition: A | Discharge status: Home / Self Care | Payer: 59 | Attending: Orthopaedic Surgery | Admitting: Orthopaedic Surgery

## 2021-01-09 DIAGNOSIS — Z87891 Personal history of nicotine dependence: Secondary | ICD-10-CM | POA: Insufficient documentation

## 2021-01-09 DIAGNOSIS — Z96649 Presence of unspecified artificial hip joint: Secondary | ICD-10-CM

## 2021-01-09 DIAGNOSIS — I10 Essential (primary) hypertension: Secondary | ICD-10-CM | POA: Diagnosis not present

## 2021-01-09 DIAGNOSIS — M1612 Unilateral primary osteoarthritis, left hip: Secondary | ICD-10-CM

## 2021-01-09 DIAGNOSIS — Z79899 Other long term (current) drug therapy: Secondary | ICD-10-CM | POA: Diagnosis not present

## 2021-01-09 DIAGNOSIS — Z96642 Presence of left artificial hip joint: Secondary | ICD-10-CM

## 2021-01-09 DIAGNOSIS — Z7982 Long term (current) use of aspirin: Secondary | ICD-10-CM | POA: Insufficient documentation

## 2021-01-09 DIAGNOSIS — Z853 Personal history of malignant neoplasm of breast: Secondary | ICD-10-CM | POA: Insufficient documentation

## 2021-01-09 DIAGNOSIS — Z419 Encounter for procedure for purposes other than remedying health state, unspecified: Secondary | ICD-10-CM

## 2021-01-09 DIAGNOSIS — Z85828 Personal history of other malignant neoplasm of skin: Secondary | ICD-10-CM | POA: Insufficient documentation

## 2021-01-09 HISTORY — PX: TOTAL HIP ARTHROPLASTY: SHX124

## 2021-01-09 LAB — ABO/RH: ABO/RH(D): A NEG

## 2021-01-09 SURGERY — ARTHROPLASTY, HIP, TOTAL, ANTERIOR APPROACH
Anesthesia: Spinal | Site: Hip | Laterality: Left

## 2021-01-09 MED ORDER — HYDROMORPHONE HCL 1 MG/ML IJ SOLN
0.2500 mg | INTRAMUSCULAR | Status: DC | PRN
  Administered 2021-01-09: 0.25 mg via INTRAVENOUS

## 2021-01-09 MED ORDER — CEFAZOLIN SODIUM-DEXTROSE 2-4 GM/100ML-% IV SOLN
2.0000 g | Freq: Four times a day (QID) | INTRAVENOUS | Status: AC
  Administered 2021-01-09 – 2021-01-10 (×2): 2 g via INTRAVENOUS
  Filled 2021-01-09 (×2): qty 100

## 2021-01-09 MED ORDER — TRANEXAMIC ACID 1000 MG/10ML IV SOLN
INTRAVENOUS | Status: DC | PRN
  Administered 2021-01-09: 2000 mg via TOPICAL

## 2021-01-09 MED ORDER — SODIUM CHLORIDE 0.9 % IR SOLN
Status: DC | PRN
  Administered 2021-01-09: 1000 mL

## 2021-01-09 MED ORDER — MENTHOL 3 MG MT LOZG: 1.0000 | LOZENGE | OROMUCOSAL | Status: DC | PRN

## 2021-01-09 MED ORDER — METHOCARBAMOL 500 MG PO TABS
500.0000 mg | ORAL_TABLET | Freq: Four times a day (QID) | ORAL | Status: DC | PRN
  Administered 2021-01-10: 500 mg via ORAL
  Filled 2021-01-09 (×2): qty 1

## 2021-01-09 MED ORDER — SORBITOL 70 % SOLN
30.0000 mL | Freq: Every day | Status: DC | PRN
  Filled 2021-01-09: qty 30

## 2021-01-09 MED ORDER — HYDROMORPHONE HCL 1 MG/ML IJ SOLN
0.5000 mg | INTRAMUSCULAR | Status: DC | PRN
  Administered 2021-01-09: 1 mg via INTRAVENOUS
  Filled 2021-01-09: qty 1

## 2021-01-09 MED ORDER — ONDANSETRON HCL 4 MG/2ML IJ SOLN: 4.0000 mg | Freq: Once | INTRAMUSCULAR | Status: DC | PRN

## 2021-01-09 MED ORDER — HYDROXYZINE HCL 50 MG/ML IM SOLN
50.0000 mg | Freq: Four times a day (QID) | INTRAMUSCULAR | Status: DC | PRN
  Administered 2021-01-10: 50 mg via INTRAMUSCULAR
  Filled 2021-01-09: qty 1

## 2021-01-09 MED ORDER — PHENOL 1.4 % MT LIQD: 1.0000 | OROMUCOSAL | Status: DC | PRN

## 2021-01-09 MED ORDER — ACETAMINOPHEN 325 MG PO TABS: 325.0000 mg | ORAL_TABLET | Freq: Four times a day (QID) | ORAL | Status: DC | PRN

## 2021-01-09 MED ORDER — DEXAMETHASONE SODIUM PHOSPHATE 10 MG/ML IJ SOLN
10.0000 mg | Freq: Once | INTRAMUSCULAR | Status: AC
Start: 2021-01-10 — End: 2021-01-10
  Administered 2021-01-10: 10 mg via INTRAVENOUS
  Filled 2021-01-09: qty 1

## 2021-01-09 MED ORDER — MIDAZOLAM HCL 2 MG/2ML IJ SOLN
INTRAMUSCULAR | Status: AC
  Filled 2021-01-09: qty 2

## 2021-01-09 MED ORDER — BUPIVACAINE-MELOXICAM ER 400-12 MG/14ML IJ SOLN
INTRAMUSCULAR | Status: DC | PRN
  Administered 2021-01-09: 400 mg

## 2021-01-09 MED ORDER — OXYCODONE HCL 5 MG/5ML PO SOLN: 5.0000 mg | Freq: Once | ORAL | Status: AC | PRN

## 2021-01-09 MED ORDER — METHOCARBAMOL 1000 MG/10ML IJ SOLN
500.0000 mg | Freq: Four times a day (QID) | INTRAVENOUS | Status: DC | PRN
  Filled 2021-01-09: qty 5

## 2021-01-09 MED ORDER — TRANEXAMIC ACID-NACL 1000-0.7 MG/100ML-% IV SOLN
INTRAVENOUS | Status: AC
  Filled 2021-01-09: qty 100

## 2021-01-09 MED ORDER — MIDAZOLAM HCL 5 MG/5ML IJ SOLN
INTRAMUSCULAR | Status: DC | PRN
  Administered 2021-01-09 (×2): 1 mg via INTRAVENOUS

## 2021-01-09 MED ORDER — FENTANYL CITRATE (PF) 100 MCG/2ML IJ SOLN
INTRAMUSCULAR | Status: DC | PRN
  Administered 2021-01-09 (×2): 50 ug via INTRAVENOUS
  Administered 2021-01-09 (×3): 25 ug via INTRAVENOUS

## 2021-01-09 MED ORDER — METOCLOPRAMIDE HCL 5 MG PO TABS: 5.0000 mg | ORAL_TABLET | Freq: Three times a day (TID) | ORAL | Status: DC | PRN

## 2021-01-09 MED ORDER — LIDOCAINE HCL (PF) 2 % IJ SOLN
INTRAMUSCULAR | Status: DC | PRN
  Administered 2021-01-09: 10 mg via INTRADERMAL

## 2021-01-09 MED ORDER — 0.9 % SODIUM CHLORIDE (POUR BTL) OPTIME
TOPICAL | Status: DC | PRN
  Administered 2021-01-09: 1000 mL

## 2021-01-09 MED ORDER — ASPIRIN 81 MG PO CHEW
81.0000 mg | CHEWABLE_TABLET | Freq: Two times a day (BID) | ORAL | Status: DC
  Administered 2021-01-09 – 2021-01-10 (×2): 81 mg via ORAL
  Filled 2021-01-09 (×2): qty 1

## 2021-01-09 MED ORDER — OXYCODONE HCL 5 MG PO TABS: 10.0000 mg | ORAL_TABLET | ORAL | Status: DC | PRN

## 2021-01-09 MED ORDER — CHLORHEXIDINE GLUCONATE 0.12 % MT SOLN
15.0000 mL | Freq: Once | OROMUCOSAL | Status: AC
  Administered 2021-01-09: 15 mL via OROMUCOSAL
  Filled 2021-01-09: qty 15

## 2021-01-09 MED ORDER — BUPIVACAINE LIPOSOME 1.3 % IJ SUSP
INTRAMUSCULAR | Status: AC
  Filled 2021-01-09: qty 20

## 2021-01-09 MED ORDER — POVIDONE-IODINE 10 % EX SWAB
2.0000 "application " | Freq: Once | CUTANEOUS | Status: AC
  Administered 2021-01-09: 2 via TOPICAL

## 2021-01-09 MED ORDER — MAGNESIUM CITRATE PO SOLN: 1.0000 | Freq: Once | ORAL | Status: DC | PRN

## 2021-01-09 MED ORDER — ALUM & MAG HYDROXIDE-SIMETH 200-200-20 MG/5ML PO SUSP: 30.0000 mL | ORAL | Status: DC | PRN

## 2021-01-09 MED ORDER — OXYCODONE HCL 5 MG PO TABS
5.0000 mg | ORAL_TABLET | ORAL | Status: DC | PRN
  Administered 2021-01-09: 5 mg via ORAL
  Filled 2021-01-09: qty 2
  Filled 2021-01-09: qty 1

## 2021-01-09 MED ORDER — DIPHENHYDRAMINE HCL 12.5 MG/5ML PO ELIX
25.0000 mg | ORAL_SOLUTION | ORAL | Status: DC | PRN
  Filled 2021-01-09: qty 10

## 2021-01-09 MED ORDER — SODIUM CHLORIDE 0.9 % IV SOLN: INTRAVENOUS | Status: DC

## 2021-01-09 MED ORDER — EPHEDRINE SULFATE-NACL 50-0.9 MG/10ML-% IV SOSY
PREFILLED_SYRINGE | INTRAVENOUS | Status: DC | PRN
  Administered 2021-01-09 (×5): 5 mg via INTRAVENOUS

## 2021-01-09 MED ORDER — DOCUSATE SODIUM 100 MG PO CAPS
100.0000 mg | ORAL_CAPSULE | Freq: Two times a day (BID) | ORAL | Status: DC
  Administered 2021-01-09 – 2021-01-10 (×2): 100 mg via ORAL
  Filled 2021-01-09 (×2): qty 1

## 2021-01-09 MED ORDER — ONDANSETRON HCL 4 MG PO TABS: 4.0000 mg | ORAL_TABLET | Freq: Four times a day (QID) | ORAL | Status: DC | PRN

## 2021-01-09 MED ORDER — IRRISEPT - 450ML BOTTLE WITH 0.05% CHG IN STERILE WATER, USP 99.95% OPTIME
TOPICAL | Status: DC | PRN
  Administered 2021-01-09: 450 mL via TOPICAL

## 2021-01-09 MED ORDER — HYDROMORPHONE HCL 1 MG/ML IJ SOLN
INTRAMUSCULAR | Status: AC
  Filled 2021-01-09: qty 1

## 2021-01-09 MED ORDER — ONDANSETRON HCL 4 MG/2ML IJ SOLN: 4.0000 mg | Freq: Four times a day (QID) | INTRAMUSCULAR | Status: DC | PRN

## 2021-01-09 MED ORDER — SODIUM CHLORIDE (PF) 0.9 % IJ SOLN
INTRAMUSCULAR | Status: AC
  Filled 2021-01-09: qty 50

## 2021-01-09 MED ORDER — VANCOMYCIN HCL 1000 MG IV SOLR
INTRAVENOUS | Status: AC
  Filled 2021-01-09: qty 1000

## 2021-01-09 MED ORDER — OXYCODONE HCL ER 10 MG PO T12A
10.0000 mg | EXTENDED_RELEASE_TABLET | Freq: Two times a day (BID) | ORAL | Status: DC
  Administered 2021-01-09 – 2021-01-10 (×2): 10 mg via ORAL
  Filled 2021-01-09 (×2): qty 1

## 2021-01-09 MED ORDER — POLYETHYLENE GLYCOL 3350 17 G PO PACK
17.0000 g | PACK | Freq: Every day | ORAL | Status: DC
  Administered 2021-01-09 – 2021-01-10 (×2): 17 g via ORAL
  Filled 2021-01-09: qty 1

## 2021-01-09 MED ORDER — PROPOFOL 10 MG/ML IV BOLUS
INTRAVENOUS | Status: DC | PRN
  Administered 2021-01-09: 10 mg via INTRAVENOUS

## 2021-01-09 MED ORDER — BUPIVACAINE-EPINEPHRINE (PF) 0.25% -1:200000 IJ SOLN
INTRAMUSCULAR | Status: AC
  Filled 2021-01-09: qty 30

## 2021-01-09 MED ORDER — OXYCODONE HCL 5 MG PO TABS
ORAL_TABLET | ORAL | Status: AC
  Filled 2021-01-09: qty 1

## 2021-01-09 MED ORDER — BUPIVACAINE IN DEXTROSE 0.75-8.25 % IT SOLN
INTRATHECAL | Status: DC | PRN
  Administered 2021-01-09: 1.6 mL via INTRATHECAL

## 2021-01-09 MED ORDER — GLYCOPYRROLATE 0.2 MG/ML IJ SOLN
0.2000 mg | Freq: Once | INTRAMUSCULAR | Status: AC
  Administered 2021-01-09: 0.2 mg via INTRAVENOUS

## 2021-01-09 MED ORDER — EPHEDRINE 5 MG/ML INJ
INTRAVENOUS | Status: AC
  Filled 2021-01-09: qty 10

## 2021-01-09 MED ORDER — BUPIVACAINE-MELOXICAM ER 400-12 MG/14ML IJ SOLN
INTRAMUSCULAR | Status: AC
  Filled 2021-01-09: qty 1

## 2021-01-09 MED ORDER — PROPOFOL 500 MG/50ML IV EMUL
INTRAVENOUS | Status: DC | PRN
  Administered 2021-01-09: 100 ug/kg/min via INTRAVENOUS

## 2021-01-09 MED ORDER — CEFAZOLIN SODIUM-DEXTROSE 2-4 GM/100ML-% IV SOLN
2.0000 g | INTRAVENOUS | Status: AC
  Administered 2021-01-09: 2 g via INTRAVENOUS
  Filled 2021-01-09: qty 100

## 2021-01-09 MED ORDER — FENTANYL CITRATE (PF) 250 MCG/5ML IJ SOLN
INTRAMUSCULAR | Status: AC
  Filled 2021-01-09: qty 5

## 2021-01-09 MED ORDER — TRANEXAMIC ACID-NACL 1000-0.7 MG/100ML-% IV SOLN
1000.0000 mg | INTRAVENOUS | Status: AC
  Administered 2021-01-09: 1000 mg via INTRAVENOUS
  Filled 2021-01-09: qty 100

## 2021-01-09 MED ORDER — ONDANSETRON HCL 4 MG/2ML IJ SOLN
INTRAMUSCULAR | Status: DC | PRN
  Administered 2021-01-09: 4 mg via INTRAVENOUS

## 2021-01-09 MED ORDER — METOCLOPRAMIDE HCL 5 MG/ML IJ SOLN: 5.0000 mg | Freq: Three times a day (TID) | INTRAMUSCULAR | Status: DC | PRN

## 2021-01-09 MED ORDER — LIDOCAINE HCL (PF) 2 % IJ SOLN
INTRAMUSCULAR | Status: AC
  Filled 2021-01-09: qty 5

## 2021-01-09 MED ORDER — ONDANSETRON HCL 4 MG/2ML IJ SOLN
INTRAMUSCULAR | Status: AC
  Filled 2021-01-09: qty 2

## 2021-01-09 MED ORDER — VANCOMYCIN HCL 1 G IV SOLR
INTRAVENOUS | Status: DC | PRN
  Administered 2021-01-09: 1000 mg

## 2021-01-09 MED ORDER — PANTOPRAZOLE SODIUM 40 MG PO TBEC
40.0000 mg | DELAYED_RELEASE_TABLET | Freq: Every day | ORAL | Status: DC
  Administered 2021-01-09 – 2021-01-10 (×2): 40 mg via ORAL
  Filled 2021-01-09 (×2): qty 1

## 2021-01-09 MED ORDER — ACETAMINOPHEN 500 MG PO TABS
1000.0000 mg | ORAL_TABLET | Freq: Four times a day (QID) | ORAL | Status: DC
  Administered 2021-01-09 – 2021-01-10 (×3): 1000 mg via ORAL
  Filled 2021-01-09 (×3): qty 2

## 2021-01-09 MED ORDER — GLYCOPYRROLATE 0.2 MG/ML IJ SOLN
INTRAMUSCULAR | Status: AC
  Filled 2021-01-09: qty 1

## 2021-01-09 MED ORDER — TRANEXAMIC ACID-NACL 1000-0.7 MG/100ML-% IV SOLN
1000.0000 mg | Freq: Once | INTRAVENOUS | Status: AC
  Administered 2021-01-09: 1000 mg via INTRAVENOUS
  Filled 2021-01-09: qty 100

## 2021-01-09 MED ORDER — OXYCODONE HCL 5 MG PO TABS
5.0000 mg | ORAL_TABLET | Freq: Once | ORAL | Status: AC | PRN
  Administered 2021-01-09: 5 mg via ORAL

## 2021-01-09 MED ORDER — LACTATED RINGERS IV SOLN: INTRAVENOUS | Status: DC

## 2021-01-09 SURGICAL SUPPLY — 64 items
ADH SKN CLS LQ APL DERMABOND (GAUZE/BANDAGES/DRESSINGS) ×1
BAG DECANTER FOR FLEXI CONT (MISCELLANEOUS) ×2 IMPLANT
CELLS DAT CNTRL 66122 CELL SVR (MISCELLANEOUS) IMPLANT
COVER PERINEAL POST (MISCELLANEOUS) ×2 IMPLANT
COVER SURGICAL LIGHT HANDLE (MISCELLANEOUS) ×2 IMPLANT
COVER WAND RF STERILE (DRAPES) ×2 IMPLANT
DERMABOND ADHESIVE PROPEN (GAUZE/BANDAGES/DRESSINGS) ×1
DERMABOND ADVANCED .7 DNX6 (GAUZE/BANDAGES/DRESSINGS) IMPLANT
DRAPE C-ARM 42X72 X-RAY (DRAPES) ×2 IMPLANT
DRAPE POUCH INSTRU U-SHP 10X18 (DRAPES) ×2 IMPLANT
DRAPE STERI IOBAN 125X83 (DRAPES) ×2 IMPLANT
DRAPE U-SHAPE 47X51 STRL (DRAPES) ×4 IMPLANT
DRSG AQUACEL AG ADV 3.5X10 (GAUZE/BANDAGES/DRESSINGS) ×2 IMPLANT
DURAPREP 26ML APPLICATOR (WOUND CARE) ×4 IMPLANT
ELECT BLADE 4.0 EZ CLEAN MEGAD (MISCELLANEOUS) ×2
ELECT REM PT RETURN 9FT ADLT (ELECTROSURGICAL) ×2
ELECTRODE BLDE 4.0 EZ CLN MEGD (MISCELLANEOUS) ×1 IMPLANT
ELECTRODE REM PT RTRN 9FT ADLT (ELECTROSURGICAL) ×1 IMPLANT
GLOVE ECLIPSE 7.0 STRL STRAW (GLOVE) ×4 IMPLANT
GLOVE SKINSENSE NS SZ7.5 (GLOVE) ×1
GLOVE SKINSENSE STRL SZ7.5 (GLOVE) ×1 IMPLANT
GLOVE SURG SYN 7.5  E (GLOVE) ×8
GLOVE SURG SYN 7.5 E (GLOVE) ×4 IMPLANT
GLOVE SURG SYN 7.5 PF PI (GLOVE) ×4 IMPLANT
GLOVE SURG UNDER POLY LF SZ7 (GLOVE) ×2 IMPLANT
GOWN STRL REIN XL XLG (GOWN DISPOSABLE) ×2 IMPLANT
GOWN STRL REUS W/ TWL LRG LVL3 (GOWN DISPOSABLE) IMPLANT
GOWN STRL REUS W/ TWL XL LVL3 (GOWN DISPOSABLE) ×1 IMPLANT
GOWN STRL REUS W/TWL LRG LVL3 (GOWN DISPOSABLE)
GOWN STRL REUS W/TWL XL LVL3 (GOWN DISPOSABLE) ×2
HANDPIECE INTERPULSE COAX TIP (DISPOSABLE) ×2
HEAD CERAMIC 36 PLUS5 (Hips) ×1 IMPLANT
HOOD PEEL AWAY FLYTE STAYCOOL (MISCELLANEOUS) ×5 IMPLANT
IV NS IRRIG 3000ML ARTHROMATIC (IV SOLUTION) ×2 IMPLANT
JET LAVAGE IRRISEPT WOUND (IRRIGATION / IRRIGATOR) ×2
KIT BASIN OR (CUSTOM PROCEDURE TRAY) ×2 IMPLANT
LAVAGE JET IRRISEPT WOUND (IRRIGATION / IRRIGATOR) ×1 IMPLANT
LINER NEUTRAL 52X36MM PLUS 4 (Liner) ×1 IMPLANT
MARKER SKIN DUAL TIP RULER LAB (MISCELLANEOUS) ×2 IMPLANT
NDL SPNL 18GX3.5 QUINCKE PK (NEEDLE) ×1 IMPLANT
NEEDLE SPNL 18GX3.5 QUINCKE PK (NEEDLE) ×2 IMPLANT
PACK TOTAL JOINT (CUSTOM PROCEDURE TRAY) ×2 IMPLANT
PACK UNIVERSAL I (CUSTOM PROCEDURE TRAY) ×2 IMPLANT
PIN SECTOR W/GRIP ACE CUP 52MM (Hips) ×1 IMPLANT
RETRACTOR WND ALEXIS 18 MED (MISCELLANEOUS) IMPLANT
RTRCTR WOUND ALEXIS 18CM MED (MISCELLANEOUS)
SAW OSC TIP CART 19.5X105X1.3 (SAW) ×2 IMPLANT
SET HNDPC FAN SPRY TIP SCT (DISPOSABLE) ×1 IMPLANT
STAPLER VISISTAT 35W (STAPLE) IMPLANT
STEM FEM ACTIS STD SZ2 (Stem) ×1 IMPLANT
SUT ETHIBOND 2 V 37 (SUTURE) ×2 IMPLANT
SUT ETHILON 2 0 FS 18 (SUTURE) ×3 IMPLANT
SUT VIC AB 0 CT1 27 (SUTURE) ×2
SUT VIC AB 0 CT1 27XBRD ANBCTR (SUTURE) ×1 IMPLANT
SUT VIC AB 1 CTX 36 (SUTURE) ×4
SUT VIC AB 1 CTX36XBRD ANBCTR (SUTURE) ×1 IMPLANT
SUT VIC AB 2-0 CT1 27 (SUTURE) ×6
SUT VIC AB 2-0 CT1 TAPERPNT 27 (SUTURE) ×2 IMPLANT
SYR 50ML LL SCALE MARK (SYRINGE) ×2 IMPLANT
TOWEL GREEN STERILE (TOWEL DISPOSABLE) ×2 IMPLANT
TRAY CATH 16FR W/PLASTIC CATH (SET/KITS/TRAYS/PACK) IMPLANT
TRAY FOLEY W/BAG SLVR 16FR (SET/KITS/TRAYS/PACK) ×2
TRAY FOLEY W/BAG SLVR 16FR ST (SET/KITS/TRAYS/PACK) ×1 IMPLANT
YANKAUER SUCT BULB TIP NO VENT (SUCTIONS) ×2 IMPLANT

## 2021-01-09 NOTE — Op Note (Signed)
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 Total hip replacement; Left hip; uncemented [id number]   Surgeon: Denise Ali, M.D.  Assist: Denise Rob, PA-C   Anesthesia: spinal  Prosthesis: Depuy Acetabulum: Pinnacle 52 mm Femur: Actis 2 STD Head: 36 mm size: +5 Liner: +4 Bearing Type: ceramic/poly  Total Hip Arthroplasty (Anterior Approach) Op Note:  After informed consent was obtained and the operative extremity marked in the holding area, the patient was brought back to the operating room and placed supine on the HANA table. Next, the operative extremity was prepped and draped in normal sterile fashion. Surgical timeout occurred verifying patient identification, surgical site, surgical procedure and administration of antibiotics.  A modified anterior Smith-Peterson approach to the hip was performed, using the interval between tensor fascia lata and sartorius.  Dissection was carried bluntly down onto the anterior hip capsule. The lateral femoral circumflex vessels were identified and coagulated. A capsulotomy was performed with a large effusion and the capsular flaps tagged for later repair.  The neck osteotomy was performed. The femoral head was removed which showed severe degenerative wear, the acetabular rim was cleared of soft tissue and attention was turned to reaming the acetabulum.  Sequential reaming was performed under fluoroscopic guidance. We reamed to a size 51 mm, and then impacted the acetabular shell. The liner was then placed after irrigation and attention turned to the femur.  After placing the femoral hook, the leg was taken to externally rotated, extended and adducted position taking care to perform soft tissue releases to allow for adequate mobilization of the femur. Soft tissue was cleared from the  shoulder of the greater trochanter and the hook elevator used to improve exposure of the proximal femur. Sequential broaching performed up to a size 2. Trial neck and head were placed. The leg was brought back up to neutral and the construct reduced.  Antibiotic irrigation was placed in the surgical wound and kept for at least 1 minute.  The position and sizing of components, offset and leg lengths were checked using fluoroscopy. Stability of the construct was checked in extension and external rotation without any subluxation or impingement of prosthesis. We dislocated the prosthesis, dropped the leg back into position, removed trial components, and irrigated copiously. The final stem and head was then placed, the leg brought back up, the system reduced and fluoroscopy used to verify positioning.  We irrigated, obtained hemostasis and closed the capsule using #2 ethibond suture.  One gram of vancomycin powder was placed in the surgical bed.   One gram of topical tranexamic acid was injected into the joint.  The fascia was closed with #1 vicryl plus, the deep fat layer was closed with 0 vicryl, the subcutaneous layers closed with 2.0 Vicryl Plus and the skin closed with 2.0 nylon and dermabond. A sterile dressing was applied. The patient was awakened in the operating room and taken to recovery in stable condition.  All sponge, needle, and instrument counts were correct at the end of the case.   Denise Ali, my PA, was a medical necessity for opening, closing, limb positioning, retracting, exposing, and overall facilitation and timely completion of the surgery.  Position: supine  Complications: see description of procedure.  Time Out: performed   Drains/Packing: none  Estimated blood loss: see anesthesia record  Returned to Recovery Room:  in good condition.   Antibiotics: yes   Mechanical VTE (DVT) Prophylaxis: sequential compression devices, TED thigh-high  Chemical VTE (DVT) Prophylaxis:  aspirin   Fluid Replacement: see anesthesia record  Specimens Removed: 1 to pathology   Sponge and Instrument Count Correct? yes   PACU: portable radiograph - low AP   Plan/RTC: Return in 2 weeks for staple removal. Weight Bearing/Load Lower Extremity: full  Hip precautions: none Suture Removal: 2 weeks   N. Denise Roux, MD Denise Ali 2:14 PM   Implant Name Type Inv. Item Serial No. Manufacturer Lot No. LRB No. Used Action  HEAD CERAMIC 36 PLUS5 - [id number] Hips HEAD CERAMIC 36 PLUS5  [employer] [id number] Left 1 Implanted  STEM FEM ACTIS STD SZ2 - [id number] Stem STEM FEM ACTIS STD SZ2  [employer] [id number] Left 1 Implanted  LINER NEUTRAL 52X36MM PLUS 4 - [id number] Liner LINER NEUTRAL 52X36MM PLUS 4  [employer] [id number] Left 1 Implanted  PIN SECTOR W/GRIP ACE CUP 52MM - [id number] Hips PIN SECTOR W/GRIP ACE CUP 52MM  [employer] [id number] Left 1 Implanted

## 2021-01-09 NOTE — H&P (Signed)
PREOPERATIVE H&P  Chief Complaint: LEFT HIP DEGENERATIVE JOINT DISEASE  HPI: Denise Ali is a 70 y.o. female who presents for surgical treatment of LEFT HIP DEGENERATIVE JOINT DISEASE.  She denies any changes in medical history.  Past Medical History:  Diagnosis Date   Anemia    Arthritis    Bradycardia 03/2011   mild - at one time with PACs/ asymptomatic    Breast cancer screening, high risk patient 10/18/2011   Cataracts, bilateral    Colon polyp 12/2007   Disorder of bone and cartilage, unspecified    Edema    Family history of malignant neoplasm of breast    evista for BRCA ppx   Fibrocystic breast    Human papillomavirus in conditions classified elsewhere and of unspecified site    Hypertension    Hypopotassemia    Neoplasm of uncertain behavior of skin    Osteopenia    Other screening mammogram    Pneumonia    Predominant disturbance of emotions    Routine gynecological examination    Seasonal allergies    Unspecified vitamin D deficiency    Past Surgical History:  Procedure Laterality Date   BREAST BIOPSY  01/20/2010   BREAST BIOPSY  02/20/2010   Fibrocystic change   Breast MRI  01/20/2010   Normal   cataracts  03/24/2011   bilateral   COLONOSCOPY W/ POLYPECTOMY  2019   DEXA  12/22/2003   osteopenia   DEXA  05/23/2006   stable   DEXA  07/23/2008   Osteopenia-slightly improved   EYE SURGERY Bilateral    cataracts removed   LEEP     HPV   procedure for "bad pap"     TONSILLECTOMY     Social History   Socioeconomic History   Marital status: Divorced    Spouse name: Not on file   Number of children: 1   Years of education: Not on file   Highest education level: Not on file  Occupational History   Occupation: Social Services-works at [employer]  Tobacco Use   Smoking status: Former    Pack years: 0.00    Types: Cigarettes    Quit date: 07/23/1985    Years since quitting: 35.4   Smokeless tobacco: Never   Tobacco comments:    Quit  "years ago"  Vaping Use   Vaping Use: Never used  Substance and Sexual Activity   Alcohol use: Yes    Alcohol/week: 0.0 standard drinks    Comment: Occasional   Drug use: No   Sexual activity: Not Currently  Other Topics Concern   Not on file  Social History Narrative   Works very long hours      Quit smoking year ago            Social Determinants of Radio broadcast assistant Strain: Not on file  Food Insecurity: Not on file  Transportation Needs: Not on file  Physical Activity: Not on file  Stress: Not on file  Social Connections: Not on file   Family History  Problem Relation Age of Onset   Breast cancer Mother    Osteoporosis Mother    Diabetes Father    Skin cancer Father        ?   Osteopenia Sister    Breast cancer Sister    Breast cancer Other        cousin/Grandmother   Heart attack Paternal Grandfather    Colon cancer Neg Hx  Esophageal cancer Neg Hx    Rectal cancer Neg Hx    Stomach cancer Neg Hx    Allergies  Allergen Reactions   Alendronate Sodium Other (See Comments)    REACTION: leg and joint pain   Prior to Admission medications   Medication Sig Start Date End Date Taking? Authorizing Provider  Calcium 1200-1000 MG-UNIT CHEW Chew 2 tablets by mouth daily.   Yes [provider]  Cholecalciferol (VITAMIN D) 50 MCG (2000 UT) CAPS Take 2,000 Units by mouth daily.    Yes [provider]  hydrochlorothiazide (HYDRODIURIL) 25 MG tablet Take 1 tablet (25 mg total) by mouth daily. 09/14/20  Yes Tower, Wynelle Fanny, MD  naproxen sodium (ALEVE) 220 MG tablet Take 220 mg by mouth daily as needed (pain).   Yes [provider]  triamcinolone cream (KENALOG) 0.5 % Apply 1 application topically 2 (two) times daily. Patient taking differently: Apply 1 application topically 2 (two) times daily as needed (itching). 12/23/20  Yes Bedsole, Amy E, MD  aspirin EC 81 MG tablet Take 1 tablet (81 mg total) by mouth 2 (two) times daily. To be  taken after surgery 01/04/21   Aundra Dubin, PA-C  docusate sodium (COLACE) 100 MG capsule Take 1 capsule (100 mg total) by mouth daily as needed. 01/04/21 01/04/22  Aundra Dubin, PA-C  methocarbamol (ROBAXIN) 500 MG tablet Take 1 tablet (500 mg total) by mouth 2 (two) times daily as needed. To be taken after surgery 01/04/21   Aundra Dubin, PA-C  ondansetron (ZOFRAN) 4 MG tablet Take 1 tablet (4 mg total) by mouth every 8 (eight) hours as needed for nausea or vomiting. 01/04/21   Aundra Dubin, PA-C  oxyCODONE-acetaminophen (PERCOCET) 5-325 MG tablet Take 1-2 tablets by mouth every 6 (six) hours as needed. To be taken after surgery 01/04/21   Aundra Dubin, PA-C     Positive ROS: All other systems have been reviewed and were otherwise negative with the exception of those mentioned in the HPI and as above.  Physical Exam: General: Alert, no acute distress Cardiovascular: No pedal edema Respiratory: No cyanosis, no use of accessory musculature GI: abdomen soft Skin: No lesions in the area of chief complaint Neurologic: Sensation intact distally Psychiatric: Patient is competent for consent with normal mood and affect Lymphatic: no lymphedema  MUSCULOSKELETAL: exam stable  Assessment: LEFT HIP DEGENERATIVE JOINT DISEASE  Plan: Plan for Procedure(s): LEFT TOTAL HIP ARTHROPLASTY ANTERIOR APPROACH  The risks benefits and alternatives were discussed with the patient including but not limited to the risks of nonoperative treatment, versus surgical intervention including infection, bleeding, nerve injury,  blood clots, cardiopulmonary complications, morbidity, mortality, among others, and they were willing to proceed.   Preoperative templating of the joint replacement has been completed, documented, and submitted to the Operating Room personnel in order to optimize intra-operative equipment management.   Eduard Roux, MD 01/09/2021 12:35 PM

## 2021-01-09 NOTE — Anesthesia Postprocedure Evaluation (Signed)
Anesthesia Post Note  Patient: Denise Ali  Procedure(s) Performed: LEFT TOTAL HIP ARTHROPLASTY ANTERIOR APPROACH (Left: Hip)     Patient location during evaluation: PACU Anesthesia Type: Spinal and MAC Level of consciousness: awake and alert Pain management: pain level controlled Vital Signs Assessment: post-procedure vital signs reviewed and stable Respiratory status: spontaneous breathing, nonlabored ventilation, respiratory function stable and patient connected to nasal cannula oxygen Cardiovascular status: blood pressure returned to baseline and stable Postop Assessment: no apparent nausea or vomiting Anesthetic complications: no   No notable events documented.  Last Vitals:  Vitals:   01/09/21 1517 01/09/21 1532  BP: (!) 129/59 (!) 121/50  Pulse: (!) 47 (!) 44  Resp: 12 18  Temp:    SpO2: 94% 96%    Last Pain:  Vitals:   01/09/21 1517  TempSrc:   PainSc: 0-No pain                 Belenda Cruise P Lilja Soland

## 2021-01-09 NOTE — Anesthesia Procedure Notes (Signed)
Spinal  Patient location during procedure: OR Start time: 01/09/2021 12:50 PM End time: 01/09/2021 12:51 PM Staffing Performed: anesthesiologist  Anesthesiologist: Darral Dash, DO Preanesthetic Checklist Completed: patient identified, IV checked, site marked, risks and benefits discussed, surgical consent, monitors and equipment checked, pre-op evaluation and timeout performed Spinal Block Patient position: sitting Prep: DuraPrep Patient monitoring: heart rate, cardiac monitor, continuous pulse ox and blood pressure Approach: midline Location: L3-4 Injection technique: single-shot Needle Needle type: Sprotte  Needle gauge: 24 G Needle length: 9 cm Assessment Sensory level: T4 Events: CSF return Additional Notes Patient identified. Risks/Benefits/Options discussed with patient including but not limited to bleeding, infection, nerve damage, paralysis, failed block, incomplete pain control, headache, blood pressure changes, nausea, vomiting, reactions to medications, itching and postpartum back pain. Confirmed with bedside nurse the patient's most recent platelet count. Confirmed with patient that they are not currently taking any anticoagulation, have any bleeding history or any family history of bleeding disorders. Patient expressed understanding and wished to proceed. All questions were answered. Sterile technique was used throughout the entire procedure. Please see nursing notes for vital signs. Warning signs of high block given to the patient including shortness of breath, tingling/numbness in hands, complete motor block, or any concerning symptoms with instructions to call for help. Patient was given instructions on fall risk and not to get out of bed. All questions and concerns addressed with instructions to call with any issues or inadequate analgesia.

## 2021-01-09 NOTE — Transfer of Care (Signed)
Immediate Anesthesia Transfer of Care Note  Patient: Denise Ali  Procedure(s) Performed: LEFT TOTAL HIP ARTHROPLASTY ANTERIOR APPROACH (Left: Hip)  Patient Location: PACU  Anesthesia Type:MAC and Spinal  Level of Consciousness: awake  Airway & Oxygen Therapy: Patient Spontanous Breathing  Post-op Assessment: Report given to RN and Post -op Vital signs reviewed and stable  Post vital signs: Reviewed and stable  Last Vitals:  Vitals Value Taken Time  BP 104/67 01/09/21 1447  Temp    Pulse 49 01/09/21 1449  Resp 19 01/09/21 1449  SpO2 93 % 01/09/21 1449  Vitals shown include unvalidated device data.  Last Pain:  Vitals:   01/09/21 1118  TempSrc:   PainSc: 0-No pain         Complications: No notable events documented.

## 2021-01-09 NOTE — Anesthesia Preprocedure Evaluation (Signed)
Anesthesia Evaluation  Patient identified by MRN, date of birth, ID band Patient awake    Reviewed: Allergy & Precautions, NPO status , Patient's Chart, lab work & pertinent test results  Airway Mallampati: II  TM Distance: >3 FB Neck ROM: Full    Dental  (+) Teeth Intact, Implants,    Pulmonary pneumonia, resolved, former smoker,    Pulmonary exam normal breath sounds clear to auscultation + decreased breath sounds      Cardiovascular hypertension, Pt. on medications Normal cardiovascular exam Rhythm:Regular Rate:Normal     Neuro/Psych negative neurological ROS  negative psych ROS   GI/Hepatic negative GI ROS, Neg liver ROS,   Endo/Other  Obesity  Renal/GU negative Renal ROS  negative genitourinary   Musculoskeletal  (+) Arthritis , Osteoarthritis,    Abdominal (+) + obese,   Peds  Hematology  (+) anemia ,   Anesthesia Other Findings   Reproductive/Obstetrics                             Anesthesia Physical Anesthesia Plan  ASA: 2  Anesthesia Plan: Spinal   Post-op Pain Management:    Induction: Intravenous  PONV Risk Score and Plan: Treatment may vary due to age or medical condition, Propofol infusion and Ondansetron  Airway Management Planned: Natural Airway and Simple Face Mask  Additional Equipment:   Intra-op Plan:   Post-operative Plan:   Informed Consent: I have reviewed the patients History and Physical, chart, labs and discussed the procedure including the risks, benefits and alternatives for the proposed anesthesia with the patient or authorized representative who has indicated his/her understanding and acceptance.     Dental advisory given  Plan Discussed with: CRNA and Anesthesiologist  Anesthesia Plan Comments:         Anesthesia Quick Evaluation

## 2021-01-09 NOTE — Discharge Instructions (Signed)

## 2021-01-10 ENCOUNTER — Encounter (HOSPITAL_COMMUNITY): Payer: Self-pay | Admitting: Orthopaedic Surgery

## 2021-01-10 DIAGNOSIS — M1612 Unilateral primary osteoarthritis, left hip: Secondary | ICD-10-CM | POA: Diagnosis not present

## 2021-01-10 LAB — BASIC METABOLIC PANEL
Anion gap: 6 (ref 5–15)
BUN: 13 mg/dL (ref 8–23)
CO2: 28 mmol/L (ref 22–32)
Calcium: 9.4 mg/dL (ref 8.9–10.3)
Chloride: 101 mmol/L (ref 98–111)
Creatinine, Ser: 0.63 mg/dL (ref 0.44–1.00)
GFR, Estimated: >60 mL/min (ref >60)
Glucose, Bld: 153 mg/dL — ABNORMAL HIGH (ref 70–99)
Potassium: 3.7 mmol/L (ref 3.5–5.1)
Sodium: 135 mmol/L (ref 135–145)

## 2021-01-10 LAB — CBC
HCT: 33.2 % — ABNORMAL LOW (ref 36.0–46.0)
Hemoglobin: 11.4 g/dL — ABNORMAL LOW (ref 12.0–15.0)
MCH: 31.1 pg (ref 26.0–34.0)
MCHC: 34.3 g/dL (ref 30.0–36.0)
MCV: 90.5 fL (ref 80.0–100.0)
Platelets: 183 10*3/uL (ref 150–400)
RBC: 3.67 MIL/uL — ABNORMAL LOW (ref 3.87–5.11)
RDW: 13.2 % (ref 11.5–15.5)
WBC: 9.1 10*3/uL (ref 4.0–10.5)
nRBC: 0 % (ref 0.0–0.2)

## 2021-01-10 NOTE — Progress Notes (Deleted)
Physical Therapy Note  Second session done with full note to follow;   BPs checked out much better, and stair training done;   OK for dc home from PT standpoint     01/10/21 1351  Vital Signs  Patient Position (if appropriate) Orthostatic Vitals  Orthostatic Lying   BP- Lying 110/64  Pulse- Lying 51 (map 78)  Orthostatic Sitting  BP- Sitting 122/70  Pulse- Sitting 63 (map 86)  Orthostatic Standing at 0 minutes  BP- Standing at 0 minutes 125/65  Pulse- Standing at 0 minutes 66 (map 84)  Orthostatic Standing at 3 minutes  BP- Standing at 3 minutes 128/64  Pulse- Standing at 3 minutes 62 (map 84)    Roney Marion, [place] Pager [phone] Office [phone]

## 2021-01-10 NOTE — Evaluation (Signed)
Physical Therapy Evaluation Patient Details Name: Denise Ali MRN: [id number] DOB: 28-Apr-1951 Today's Date: 01/10/2021   History of Present Illness  Admitted for L THA, done on 6/20, WBAT;  has a past medical history of Anemia, Arthritis, Bradycardia (03/2011), Cataracts, bilateral, Colon polyp (12/2007), Disorder of bone and cartilage, unspecified, Edema, Family history of malignant neoplasm of breast, Fibrocystic breast, Hypertension, Hypopotassemia, Neoplasm of uncertain behavior of skin, Osteopenia, Pneumonia, Predominant disturbance of emotions,  Seasonal allergies, and Unspecified vitamin D deficiency.  Clinical Impression   Pt is s/p THA resulting in the deficits listed below (see PT Problem List). Lives at home in a single level home by herself; two small step to enter; Independent at baseline; Sister plans to stay with pt as needed at home; Presents to PT with L hip burning pain, decr efficiency of movement; Overall moving well, despite burning pain and nausea earlier today; I anticipate she will be OK for dc home from PT standpoint;  Pt will benefit from skilled PT to increase their independence and safety with mobility to allow discharge to the venue listed below.      Follow Up Recommendations Home health PT    Equipment Recommendations  Rolling walker with 5" wheels;3in1 (PT)    Recommendations for Other Services       Precautions / Restrictions Precautions Precautions: None Restrictions LLE Weight Bearing: Weight bearing as tolerated      Mobility  Bed Mobility Overal bed mobility: Needs Assistance Bed Mobility: Supine to Sit;Sit to Supine     Supine to sit: Min assist;Min guard Sit to supine: Min guard   General bed mobility comments: Cues for technique, min handheld assist to pull to sit initial try; Able to get back into high bed without assist; second bout, pt pushed through elbow prop, pivotted to  moved LEs off of bed, and pushed up to fully upright  sitting without assist    Transfers Overall transfer level: Needs assistance Equipment used: Rolling walker (2 wheeled) Transfers: Sit to/from Stand Sit to Stand: Supervision         General transfer comment: Cues for hand placement  Ambulation/Gait Ambulation/Gait assistance: Min guard;Supervision Gait Distance (Feet): 110 Feet Assistive device: Rolling walker (2 wheeled) Gait Pattern/deviations: Step-through pattern;Decreased step length - right;Decreased step length - left Gait velocity: slow   General Gait Details: Cues to stand tall on L LE in stance; short steps, but overall good steadiness with use of RW  Stairs         General stair comments: Simulated going up and down one small step; instructions on sequencing; pt and sister verbalizd understanding  Wheelchair Mobility    Modified Rankin (Stroke Patients Only)       Balance Overall balance assessment: No apparent balance deficits (not formally assessed)                                           Pertinent Vitals/Pain Pain Assessment: 0-10 Pain Score: 7  Pain Location: L hip with weight bearing Pain Descriptors / Indicators: Aching;Burning Pain Intervention(s): Monitored during session    Home Living Family/patient expects to be discharged to:: Private residence Living Arrangements: Alone Available Help at Discharge: Family;Other (Comment) (sister plans to stay with her) Type of Home: House Home Access: Stairs to enter Entrance Stairs-Rails: None Entrance Stairs-Number of Steps:  (2 small threshold-typ steps to enter) Home Layout: One  level Home Equipment: None      Prior Function Level of Independence: Independent         Comments: Works at [employer]        Extremity/Trunk Assessment   Upper Extremity Assessment Upper Extremity Assessment: Overall WFL for tasks assessed    Lower Extremity Assessment Lower Extremity Assessment: LLE  deficits/detail LLE Deficits / Details: S/p L THA with anticipated soreness postop; able to actively move L hip into flexion, abduction       Communication   Communication: No difficulties  Cognition Arousal/Alertness: Awake/alert Behavior During Therapy: WFL for tasks assessed/performed Overall Cognitive Status: Within Functional Limits for tasks assessed                                        General Comments General comments (skin integrity, edema, etc.): We discussed use of ice, options for positioning to sleep, and car transfers    Exercises Total Joint Exercises Ankle Circles/Pumps: AROM;Both;10 reps Quad Sets: AROM;Left;10 reps Gluteal Sets: AROM;Both;10 reps Towel Squeeze: AROM;Both;10 reps Short Arc Quad: AROM;Left;10 reps Heel Slides: AROM;Left;10 reps Hip ABduction/ADduction: AROM;Left;10 reps   Assessment/Plan    PT Assessment Patient needs continued PT services  PT Problem List Decreased strength;Decreased range of motion;Decreased activity tolerance;Decreased balance;Decreased mobility;Pain       PT Treatment Interventions DME instruction;Gait training;Stair training;Functional mobility training;Therapeutic activities;Therapeutic exercise;Patient/family education    PT Goals (Current goals can be found in the Care Plan section)  Acute Rehab PT Goals Patient Stated Goal: walk without pain PT Goal Formulation: With patient Time For Goal Achievement: 01/24/21 Potential to Achieve Goals: Good    Frequency 7X/week   Barriers to discharge        Co-evaluation               AM-PAC PT "6 Clicks" Mobility  Outcome Measure Help needed turning from your back to your side while in a flat bed without using bedrails?: A Little Help needed moving from lying on your back to sitting on the side of a flat bed without using bedrails?: None Help needed moving to and from a bed to a chair (including a wheelchair)?: None Help needed standing up  from a chair using your arms (e.g., wheelchair or bedside chair)?: A Little Help needed to walk in hospital room?: A Little Help needed climbing 3-5 steps with a railing? : A Little 6 Click Score: 20    End of Session Equipment Utilized During Treatment: Gait belt Activity Tolerance: Patient tolerated treatment well Patient left: in chair;with call bell/phone within reach;with family/visitor present Nurse Communication: Mobility status PT Visit Diagnosis: Unsteadiness on feet (R26.81);Pain Pain - Right/Left: Left Pain - part of body: Hip    Time: 0086-7619 PT Time Calculation (min) (ACUTE ONLY): 50 min   Charges:   PT Evaluation $PT Eval Low Complexity: 1 Low PT Treatments $Gait Training: 8-22 mins $Therapeutic Exercise: 8-22 mins        Roney Marion, PT  Acute Rehabilitation Services Pager [phone] Office [phone]   Colletta [place] 01/10/2021, 12:48 PM

## 2021-01-10 NOTE — Progress Notes (Signed)
Patient is discharged from room 3C10 at this time. Alert and in stable condition. IV site d/c'd and instructions read to patient and family with understanding verbalized and all questions answered. Left unit via wheelchair with all belongings at side.

## 2021-01-10 NOTE — Progress Notes (Signed)
Subjective: 1 Day Post-Op Procedure(s) (LRB): LEFT TOTAL HIP ARTHROPLASTY ANTERIOR APPROACH (Left) Patient reports pain as mild.    Objective: Vital signs in last 24 hours: Temp:  [97 F (36.1 C)-97.8 F (36.6 C)] 97.5 F (36.4 C) (06/20 2332) Pulse Rate:  [42-57] 53 (06/20 2332) Resp:  [11-29] 20 (06/20 2332) BP: (104-168)/(50-67) 143/62 (06/20 2332) SpO2:  [94 %-100 %] 100 % (06/20 2332) Weight:  [97.3 kg] 97.3 kg (06/20 1104)  Intake/Output from previous day: 06/20 0701 - 06/21 0700 In: 500 [I.V.:400; IV Piggyback:100] Out: 850 [Urine:750; Blood:100] Intake/Output this shift: No intake/output data recorded.  Recent Labs    01/10/21 0434  HGB 11.4*   Recent Labs    01/10/21 0434  WBC 9.1  RBC 3.67*  HCT 33.2*  PLT 183   Recent Labs    01/10/21 0434  NA 135  K 3.7  CL 101  CO2 28  BUN 13  CREATININE 0.63  GLUCOSE 153*  CALCIUM 9.4   No results for input(s): LABPT, INR in the last 72 hours.  Neurologically intact Neurovascular intact Sensation intact distally Intact pulses distally Dorsiflexion/Plantar flexion intact Incision: dressing C/D/I No cellulitis present Compartment soft   Assessment/Plan: 1 Day Post-Op Procedure(s) (LRB): LEFT TOTAL HIP ARTHROPLASTY ANTERIOR APPROACH (Left) Advance diet Up with therapy D/C IV fluids Discharge home with home health after second PT session WBAT LLE ABLA- mild and stable     Aundra Dubin 01/10/2021, 7:50 AM

## 2021-01-10 NOTE — Progress Notes (Signed)
Physical Therapy Treatment Patient Details Name: Denise Ali MRN: [id number] DOB: 1951/02/27 Today's Date: 01/10/2021    History of Present Illness Admitted for L THA, done on 6/20, WBAT;  has a past medical history of Anemia, Arthritis, Bradycardia (03/2011), Cataracts, bilateral, Colon polyp (12/2007), Disorder of bone and cartilage, unspecified, Edema, Family history of malignant neoplasm of breast, Fibrocystic breast, Hypertension, Hypopotassemia, Neoplasm of uncertain behavior of skin, Osteopenia, Pneumonia, Predominant disturbance of emotions,  Seasonal allergies, and Unspecified vitamin D deficiency.    PT Comments    Continuing work on functional mobility and activity tolerance;  session focused on answering pt and her sister's questions re: self-care and home management; Questions solicited and answered; Assisted pt to the bathroom, call string within reach; OK for dc home from PT standpoint    Follow Up Recommendations  Home health PT     Equipment Recommendations  Rolling walker with 5" wheels;3in1 (PT)    Recommendations for Other Services       Precautions / Restrictions Precautions Precautions: None Restrictions LLE Weight Bearing: Weight bearing as tolerated    Mobility  Bed Mobility Overal bed mobility: Needs Assistance Bed Mobility: Supine to Sit;Sit to Supine     Supine to sit: Min assist;Min guard Sit to supine: Min guard   General bed mobility comments: Cues for technique, min handheld assist to pull to sit initial try; Able to get back into high bed without assist; second bout, pt pushed through elbow prop, pivotted to  moved LEs off of bed, and pushed up to fully upright sitting without assist    Transfers Overall transfer level: Needs assistance Equipment used: Rolling walker (2 wheeled) Transfers: Sit to/from Stand Sit to Stand: Supervision         General transfer comment: Cues for hand placement, safety, and to pre-position LLE fo  rmore comfort, and to prevent losing control of descent to sit  Ambulation/Gait Ambulation/Gait assistance: Supervision Gait Distance (Feet): 12 Feet (to bathroom) Assistive device: Rolling walker (2 wheeled) Gait Pattern/deviations: Step-through pattern;Decreased step length - right;Decreased step length - left Gait velocity: slow   General Gait Details: Cues to stand tall on L LE in stance; short steps, but overall good steadiness with use of RW   Stairs         General stair comments: Simulated going up and down one small step; instructions on sequencing; pt and sister verbalizd understanding   Wheelchair Mobility    Modified Rankin (Stroke Patients Only)       Balance Overall balance assessment: No apparent balance deficits (not formally assessed)                                          Cognition Arousal/Alertness: Awake/alert Behavior During Therapy: WFL for tasks assessed/performed Overall Cognitive Status: Within Functional Limits for tasks assessed                                        Exercises Total Joint Exercises Ankle Circles/Pumps: AROM;Both;10 reps Quad Sets: AROM;Left;10 reps Gluteal Sets: AROM;Both;10 reps Towel Squeeze: AROM;Both;10 reps Short Arc Quad: AROM;Left;10 reps Heel Slides: AROM;Left;10 reps Hip ABduction/ADduction: AROM;Left;10 reps    General Comments General comments (skin integrity, edema, etc.): Took time to discuss and demonstrate options for positioning for sleep; demonstrated where to  put pillows/bolsters for R semi-sidelying      Pertinent Vitals/Pain Pain Assessment: 0-10 Pain Score: 5  Pain Location: L hip with weight bearing Pain Descriptors / Indicators: Aching;Burning Pain Intervention(s): Monitored during session    Home Living Family/patient expects to be discharged to:: Private residence Living Arrangements: Alone Available Help at Discharge: Family;Other (Comment) (sister  plans to stay with her) Type of Home: House Home Access: Stairs to enter Entrance Stairs-Rails: None Home Layout: One level Home Equipment: None      Prior Function Level of Independence: Independent      Comments: Works at [employer] (current goals can now be found in the care plan section) Acute Rehab PT Goals Patient Stated Goal: walk without pain PT Goal Formulation: With patient Time For Goal Achievement: 01/24/21 Potential to Achieve Goals: Good Progress towards PT goals: Progressing toward goals    Frequency    7X/week      PT Plan Current plan remains appropriate    Co-evaluation              AM-PAC PT "6 Clicks" Mobility   Outcome Measure  Help needed turning from your back to your side while in a flat bed without using bedrails?: A Little Help needed moving from lying on your back to sitting on the side of a flat bed without using bedrails?: None Help needed moving to and from a bed to a chair (including a wheelchair)?: None Help needed standing up from a chair using your arms (e.g., wheelchair or bedside chair)?: A Little Help needed to walk in hospital room?: A Little Help needed climbing 3-5 steps with a railing? : A Little 6 Click Score: 20    End of Session Equipment Utilized During Treatment: Gait belt Activity Tolerance: Patient tolerated treatment well Patient left: in bathroom, with call string within reach;with family/visitor present Nurse Communication: Mobility status; pt in bathroom PT Visit Diagnosis: Unsteadiness on feet (R26.81);Pain Pain - Right/Left: Left Pain - part of body: Hip     Time: 7290-2111 PT Time Calculation (min) (ACUTE ONLY): 10 min  Charges:  $Gait Training: 8-22 mins $Therapeutic Exercise: 8-22 mins $Therapeutic Activity: 8-22 mins                     Roney Marion, PT  Acute Rehabilitation Services Pager [phone] Office [place] 01/10/2021, 3:59 PM

## 2021-01-10 NOTE — Discharge Summary (Signed)
Patient ID: Denise Ali MRN: [id number] DOB/AGE: Oct 09, 1950 70 y.o.  Admit date: 01/09/2021 Discharge date: 01/10/2021  Admission Diagnoses:  Principal Problem:   Primary osteoarthritis of left hip Active Problems:   Status post total replacement of left hip   Discharge Diagnoses:  Same  Past Medical History:  Diagnosis Date   Anemia    Arthritis    Bradycardia 03/2011   mild - at one time with PACs/ asymptomatic    Breast cancer screening, high risk patient 10/18/2011   Cataracts, bilateral    Colon polyp 12/2007   Disorder of bone and cartilage, unspecified    Edema    Family history of malignant neoplasm of breast    evista for BRCA ppx   Fibrocystic breast    Human papillomavirus in conditions classified elsewhere and of unspecified site    Hypertension    Hypopotassemia    Neoplasm of uncertain behavior of skin    Osteopenia    Other screening mammogram    Pneumonia    Predominant disturbance of emotions    Routine gynecological examination    Seasonal allergies    Unspecified vitamin D deficiency     Surgeries: Procedure(s): LEFT TOTAL HIP ARTHROPLASTY ANTERIOR APPROACH on 01/09/2021   Consultants:   Discharged Condition: Improved  Hospital Course: ZORIAH PULICE is an 70 y.o. female who was admitted 01/09/2021 for operative treatment ofPrimary osteoarthritis of left hip. Patient has severe unremitting pain that affects sleep, daily activities, and work/hobbies. After pre-op clearance the patient was taken to the operating room on 01/09/2021 and underwent  Procedure(s): LEFT TOTAL HIP ARTHROPLASTY ANTERIOR APPROACH.    Patient was given perioperative antibiotics:  Anti-infectives (From admission, onward)    Start     Dose/Rate Route Frequency Ordered Stop   01/09/21 1900  ceFAZolin (ANCEF) IVPB 2g/100 mL premix        2 g 200 mL/hr over 30 Minutes Intravenous Every 6 hours 01/09/21 1447 01/10/21 0210   01/09/21 1414  vancomycin (VANCOCIN)  powder  Status:  Discontinued          As needed 01/09/21 1414 01/09/21 1443   01/09/21 1100  ceFAZolin (ANCEF) IVPB 2g/100 mL premix        2 g 200 mL/hr over 30 Minutes Intravenous On call to O.R. 01/09/21 1049 01/09/21 1253        Patient was given sequential compression devices, early ambulation, and chemoprophylaxis to prevent DVT.  Patient benefited maximally from hospital stay and there were no complications.    Recent vital signs: Patient Vitals for the past 24 hrs:  BP Temp Temp src Pulse Resp SpO2 Height Weight  01/10/21 0751 (!) 135/59 97.6 F (36.4 C) Oral (!) 52 16 98 % -- --  01/09/21 2332 (!) 143/62 (!) 97.5 F (36.4 C) Oral (!) 53 20 100 % -- --  01/09/21 1952 (!) 150/59 -- -- (!) 42 16 99 % -- --  01/09/21 1717 (!) 147/65 97.6 F (36.4 C) -- (!) 45 18 100 % -- --  01/09/21 1647 132/60 -- -- (!) 47 (!) 26 100 % -- --  01/09/21 1632 (!) 140/58 -- -- (!) 49 17 97 % -- --  01/09/21 1617 (!) 135/59 (!) 97 F (36.1 C) -- (!) 48 (!) 26 97 % -- --  01/09/21 1602 (!) 138/57 -- -- (!) 51 11 94 % -- --  01/09/21 1547 (!) 128/58 -- -- (!) 53 15 97 % -- --  01/09/21 1532 Marland Kitchen)  121/50 -- -- (!) 44 18 96 % -- --  01/09/21 1517 (!) 129/59 -- -- (!) 47 12 94 % -- --  01/09/21 1502 (!) 112/55 -- -- (!) 47 18 94 % -- --  01/09/21 1447 104/67 (!) 97 F (36.1 C) -- (!) 56 (!) 29 96 % -- --  01/09/21 1104 (!) 168/59 97.8 F (36.6 C) Oral (!) 57 18 99 % '5\' 4"'  (1.626 m) 97.3 kg     Recent laboratory studies:  Recent Labs    01/10/21 0434  WBC 9.1  HGB 11.4*  HCT 33.2*  PLT 183  NA 135  K 3.7  CL 101  CO2 28  BUN 13  CREATININE 0.63  GLUCOSE 153*  CALCIUM 9.4     Discharge Medications:   Allergies as of 01/10/2021       Reactions   Alendronate Sodium Other (See Comments)   REACTION: leg and joint pain        Medication List     STOP taking these medications    naproxen sodium 220 MG tablet Commonly known as: ALEVE       TAKE these medications     aspirin EC 81 MG tablet Take 1 tablet (81 mg total) by mouth 2 (two) times daily. To be taken after surgery   Calcium 1200-1000 MG-UNIT Chew Chew 2 tablets by mouth daily.   docusate sodium 100 MG capsule Commonly known as: Colace Take 1 capsule (100 mg total) by mouth daily as needed.   hydrochlorothiazide 25 MG tablet Commonly known as: HYDRODIURIL Take 1 tablet (25 mg total) by mouth daily.   methocarbamol 500 MG tablet Commonly known as: Robaxin Take 1 tablet (500 mg total) by mouth 2 (two) times daily as needed. To be taken after surgery   ondansetron 4 MG tablet Commonly known as: Zofran Take 1 tablet (4 mg total) by mouth every 8 (eight) hours as needed for nausea or vomiting.   oxyCODONE-acetaminophen 5-325 MG tablet Commonly known as: Percocet Take 1-2 tablets by mouth every 6 (six) hours as needed. To be taken after surgery   Vitamin D 50 MCG (2000 UT) Caps Take 2,000 Units by mouth daily.       ASK your doctor about these medications    triamcinolone cream 0.5 % Commonly known as: KENALOG Apply 1 application topically 2 (two) times daily.               Durable Medical Equipment  (From admission, onward)           Start     Ordered   01/09/21 1706  DME Walker rolling  Once       Question:  Patient needs a walker to treat with the following condition  Answer:  History of hip replacement   01/09/21 1705   01/09/21 1706  DME 3 n 1  Once        01/09/21 1705   01/09/21 1706  DME Bedside commode  Once       Question:  Patient needs a bedside commode to treat with the following condition  Answer:  History of hip replacement   01/09/21 1705            Diagnostic Studies: DG Chest 2 View  Result Date: 01/06/2021 CLINICAL DATA:  Osteoarthritis of the left hip EXAM: CHEST - 2 VIEW COMPARISON:  None. FINDINGS: The heart size and mediastinal contours are within normal limits. Both lungs are clear. The visualized skeletal structures are  unremarkable. IMPRESSION: No active cardiopulmonary disease. Electronically Signed   By: Dorise Bullion III M.D   On: 01/06/2021 07:32   DG Pelvis Portable  Result Date: 01/09/2021 CLINICAL DATA:  Left hip replacement. EXAM: PORTABLE PELVIS 1-2 VIEWS COMPARISON:  Radiograph 09/20/2020 FINDINGS: Left hip arthroplasty in expected alignment. No periprosthetic lucency or fracture. Recent postsurgical change includes air in the soft tissues. Right hip osteoarthritis is similar to prior. IMPRESSION: Left hip arthroplasty without immediate postoperative complication. Electronically Signed   By: Keith Rake M.D.   On: 01/09/2021 15:20   DG C-Arm 1-60 Min  Result Date: 01/09/2021 CLINICAL DATA:  Left anterior hip. EXAM: OPERATIVE LEFT HIP (WITH PELVIS IF PERFORMED) TECHNIQUE: Fluoroscopic spot image(s) were submitted for interpretation post-operatively. COMPARISON:  Radiograph 09/20/2020 FINDINGS: Four fluoroscopic spot views of the pelvis and left hip obtained in the operating room. Interval left hip arthroplasty in expected alignment. Fluoroscopy time 27 seconds. Dose 5.125 mGy. IMPRESSION: Procedural fluoroscopy for left hip arthroplasty Electronically Signed   By: Keith Rake M.D.   On: 01/09/2021 15:44   DG HIP OPERATIVE UNILAT WITH PELVIS LEFT  Result Date: 01/09/2021 CLINICAL DATA:  Left anterior hip. EXAM: OPERATIVE LEFT HIP (WITH PELVIS IF PERFORMED) TECHNIQUE: Fluoroscopic spot image(s) were submitted for interpretation post-operatively. COMPARISON:  Radiograph 09/20/2020 FINDINGS: Four fluoroscopic spot views of the pelvis and left hip obtained in the operating room. Interval left hip arthroplasty in expected alignment. Fluoroscopy time 27 seconds. Dose 5.125 mGy. IMPRESSION: Procedural fluoroscopy for left hip arthroplasty Electronically Signed   By: Keith Rake M.D.   On: 01/09/2021 15:44    Disposition: Discharge disposition: 01-Home or Self Care          Follow-up  Information     Leandrew Koyanagi, MD. Schedule an appointment as soon as possible for a visit in 2 week(s).   Specialty: Orthopedic Surgery Contact information: [address] [place] [address] [phone]                  Signed: Aundra Dubin 01/10/2021, 7:52 AM

## 2021-01-17 ENCOUNTER — Encounter: Payer: 59 | Admitting: Orthopaedic Surgery

## 2021-01-24 ENCOUNTER — Telehealth: Payer: Self-pay | Admitting: Orthopaedic Surgery

## 2021-01-24 ENCOUNTER — Other Ambulatory Visit: Payer: Self-pay

## 2021-01-24 MED ORDER — AMOXICILLIN 500 MG PO TABS: ORAL_TABLET | ORAL | 0 refills | Status: DC

## 2021-01-24 NOTE — Telephone Encounter (Signed)
Pt called needing pre med before her dental appt today at 3pm. Pt also stated her pharmacy closes for lunch and would like this to be done asap. Pt states she called at 8 am and was waiting for a call back. Please call pt at [phone].

## 2021-01-24 NOTE — Telephone Encounter (Signed)
Sent in Amoxicillin per Dr.Xu's protocol. Called and notified patient.

## 2021-01-24 NOTE — Telephone Encounter (Signed)
Pt having a dental appt this afternoon at 3pm and pt wanted to know if she can keep that appt since she had surgery on 01/09/21. Dental also stated that if she can attend her appt that Dr. Erlinda Hong prescribe her an antibiotic. The pharmacy on file is the best one and the best call back [phone].

## 2021-01-24 NOTE — Telephone Encounter (Signed)
Sent in Amoxicillin per protocol to pharmacy on file. Patient has been notified.

## 2021-01-25 ENCOUNTER — Encounter: Payer: Self-pay | Admitting: Orthopaedic Surgery

## 2021-01-25 ENCOUNTER — Ambulatory Visit (INDEPENDENT_AMBULATORY_CARE_PROVIDER_SITE_OTHER): Payer: 59 | Admitting: Physician Assistant

## 2021-01-25 DIAGNOSIS — Z96642 Presence of left artificial hip joint: Secondary | ICD-10-CM

## 2021-01-25 NOTE — Progress Notes (Signed)
Post-Op Visit Note   Patient: Denise Ali           Date of Birth: January 03, 1951           MRN: [id number] Visit Date: 01/25/2021 PCP: Abner Greenspan, MD   Assessment & Plan:  Chief Complaint:  Chief Complaint  Patient presents with   Left Hip - Routine Post Op   Visit Diagnoses:  1. Status post total replacement of left hip     Plan: Patient is a pleasant 70 year old female who comes in today 2 weeks out left total hip replacement 01/09/2021.  She has been doing well.  She is ambulating with a walker.  She is getting home health physical therapy.  She has been taking Tylenol for pain and taking aspirin for DVT prophylaxis.  Examination of her left hip reveals a well-healing surgical incision with nylon sutures in place.  No evidence of infection or cellulitis.  Calf soft nontender.  Today, sutures were removed and Steri-Strips applied.  She will finish out her last 2 sessions of home health physical therapy and will let us know if she needs additional physical therapy.  Dental prophylaxis reinforced.  Follow-up with [place] in 4 weeks time for recheck and AP pelvis x-rays.  Follow-Up Instructions: Return in about 4 weeks (around 02/22/2021).   Orders:  No orders of the defined types were placed in this encounter.  No orders of the defined types were placed in this encounter.   Imaging: No new imaging  PMFS History: Patient Active Problem List   Diagnosis Date Noted   Status post total replacement of left hip 01/09/2021   Primary osteoarthritis of left hip 09/21/2020   Left groin pain 09/14/2020   Pain in joint, ankle and foot, left 08/19/2018   Colon polyp 01/22/2018   Estrogen deficiency 10/09/2016   Elevated glucose level 10/09/2016   Obesity 12/24/2014   Hypertension 10/05/2014   Breast cancer screening, high risk patient 10/18/2011   Routine general medical examination at a health care facility 07/02/2011   Encounter for routine gynecological examination 07/02/2011    Irregular heart beat 04/09/2011   HYPOKALEMIA 04/22/2009   Vitamin D deficiency 02/24/2009   STRESS REACTION, ACUTE, WITH EMOTIONAL DISTURBANCE 11/25/2007   Osteopenia 01/14/2007   EDEMA 01/14/2007   Past Medical History:  Diagnosis Date   Anemia    Arthritis    Bradycardia 03/2011   mild - at one time with PACs/ asymptomatic    Breast cancer screening, high risk patient 10/18/2011   Cataracts, bilateral    Colon polyp 12/2007   Disorder of bone and cartilage, unspecified    Edema    Family history of malignant neoplasm of breast    evista for BRCA ppx   Fibrocystic breast    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   Osteopenia Sister    Breast cancer Sister    Breast cancer  Other        cousin/Grandmother   Heart attack Paternal Grandfather    Colon cancer Neg Hx    Esophageal cancer Neg Hx    Rectal cancer Neg Hx    Stomach cancer Neg Hx     Past Surgical History:  Procedure Laterality Date   BREAST BIOPSY  01/20/2010   BREAST BIOPSY  02/20/2010   Fibrocystic change   Breast MRI  01/20/2010   Normal   cataracts  03/24/2011   bilateral   COLONOSCOPY W/ POLYPECTOMY  2019   DEXA  12/22/2003   osteopenia   DEXA  05/23/2006   stable   DEXA  07/23/2008   Osteopenia-slightly improved   EYE SURGERY Bilateral    cataracts removed   LEEP     HPV   procedure for "bad pap"     TONSILLECTOMY     TOTAL HIP ARTHROPLASTY Left 01/09/2021   Procedure: LEFT TOTAL HIP ARTHROPLASTY ANTERIOR APPROACH;  Surgeon: Leandrew Koyanagi, MD;  Location: [facility];  Service: Orthopedics;  Laterality: Left;  C-3   Social History   Occupational History   Occupation: Futures trader at food [employer]  Tobacco Use   Smoking status: Former    Pack years: 0.00    Types: Cigarettes    Quit date: 07/23/1985    Years since quitting: 35.5   Smokeless tobacco: Never   Tobacco comments:    Quit "years ago"  Vaping Use   Vaping Use: Never used  Substance and Sexual Activity   Alcohol use: Yes    Alcohol/week: 0.0 standard drinks    Comment: Occasional   Drug use: No   Sexual activity: Not Currently

## 2021-01-27 ENCOUNTER — Other Ambulatory Visit: Payer: Self-pay | Admitting: Physician Assistant

## 2021-02-22 ENCOUNTER — Ambulatory Visit (INDEPENDENT_AMBULATORY_CARE_PROVIDER_SITE_OTHER): Payer: 59 | Admitting: Orthopaedic Surgery

## 2021-02-22 ENCOUNTER — Ambulatory Visit (INDEPENDENT_AMBULATORY_CARE_PROVIDER_SITE_OTHER): Payer: 59

## 2021-02-22 ENCOUNTER — Other Ambulatory Visit: Payer: Self-pay

## 2021-02-22 ENCOUNTER — Telehealth: Payer: Self-pay | Admitting: Orthopaedic Surgery

## 2021-02-22 ENCOUNTER — Encounter: Payer: Self-pay | Admitting: Orthopaedic Surgery

## 2021-02-22 DIAGNOSIS — Z96642 Presence of left artificial hip joint: Secondary | ICD-10-CM

## 2021-02-22 NOTE — Telephone Encounter (Signed)
Received $25.00 check,medical records release form and disability paperwork from patient/Forwarding to [employer] today ?

## 2021-02-22 NOTE — Progress Notes (Signed)
Post-Op Visit Note   Patient: Denise Ali           Date of Birth: [birth year]           MRN: [id number] Visit Date: 02/22/2021 PCP: Abner Greenspan, MD   Assessment & Plan:  Chief Complaint:  Chief Complaint  Patient presents with   Left Hip - Pain, Follow-up   Visit Diagnoses:  1. Status post total replacement of left hip     Plan: Denise Ali is 6 weeks status post left total hip replacement.  Doing well overall.  Just recently completed home health PT and just doing exercises on her own.  Currently using a cane with steps.  Reports occasional pain.  No real complaints otherwise.  Left hip scar is healed no signs of infection.  No swelling.  Ambulation gait is still slightly limited and slow secondary to weakness to hip flexion.  Uses cane for ambulation.  X-rays are unremarkable.  Denise Ali is doing well at the 6-week mark.  She may discontinue aspirin at this point.  She still needs to be out of work so that she can improve her strength and ambulation and gait so that she does not have to use a cane.  She may discontinue the TED hose.  Recheck in 6 weeks.  Dental prophylaxis reinforced.  Follow-Up Instructions: Return in about 6 weeks (around 04/05/2021).   Orders:  Orders Placed This Encounter  Procedures   XR Pelvis 1-2 Views   No orders of the defined types were placed in this encounter.   Imaging: No results found.  PMFS History: Patient Active Problem List   Diagnosis Date Noted   Status post total replacement of left hip 01/09/2021   Primary osteoarthritis of left hip 09/21/2020   Left groin pain 09/14/2020   Pain in joint, ankle and foot, left 08/19/2018   Colon polyp 01/22/2018   Estrogen deficiency 10/09/2016   Elevated glucose level 10/09/2016   Obesity 12/24/2014   Hypertension 10/05/2014   Breast cancer screening, high risk patient 10/18/2011   Routine general medical examination at a health care facility 07/02/2011   Encounter for routine  gynecological examination 07/02/2011   Irregular heart beat 04/09/2011   HYPOKALEMIA 04/22/2009   Vitamin D deficiency 02/24/2009   STRESS REACTION, ACUTE, WITH EMOTIONAL DISTURBANCE 11/25/2007   Osteopenia 01/14/2007   EDEMA 01/14/2007   Past Medical History:  Diagnosis Date   Anemia    Arthritis    Bradycardia 03/2011   mild - at one time with PACs/ asymptomatic    Breast cancer screening, high risk patient 10/18/2011   Cataracts, bilateral    Colon polyp 12/2007   Disorder of bone and cartilage, unspecified    Edema    Family history of malignant neoplasm of breast    evista for BRCA ppx   Fibrocystic breast    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  Osteopenia Sister    Breast cancer Sister    Breast cancer Other        cousin/Grandmother   Heart attack Paternal Grandfather    Colon cancer Neg Hx    Esophageal cancer Neg Hx    Rectal cancer Neg Hx    Stomach cancer Neg Hx     Past Surgical History:  Procedure Laterality Date   BREAST BIOPSY  01/20/2010   BREAST BIOPSY  02/20/2010   Fibrocystic change   Breast MRI  01/20/2010   Normal   cataracts  03/24/2011   bilateral   COLONOSCOPY W/ POLYPECTOMY  2019   DEXA  12/22/2003   osteopenia   DEXA  05/23/2006   stable   DEXA  07/23/2008   Osteopenia-slightly improved   EYE SURGERY Bilateral    cataracts removed   LEEP     HPV   procedure for "bad pap"     TONSILLECTOMY     TOTAL HIP ARTHROPLASTY Left 01/09/2021   Procedure: LEFT TOTAL HIP ARTHROPLASTY  ANTERIOR APPROACH;  Surgeon: Leandrew Koyanagi, MD;  Location: [facility];  Service: Orthopedics;  Laterality: Left;  C-3   Social History   Occupational History   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

## 2021-03-08 NOTE — Assessment & Plan Note (Signed)
Local allergic reaction at sites... treat with topical steroid.  No S/S of tick borne illness... return precautions given.

## 2021-03-20 ENCOUNTER — Encounter: Payer: Self-pay | Admitting: Nurse Practitioner

## 2021-03-20 ENCOUNTER — Telehealth (INDEPENDENT_AMBULATORY_CARE_PROVIDER_SITE_OTHER): Payer: 59 | Admitting: Nurse Practitioner

## 2021-03-20 DIAGNOSIS — U071 COVID-19: Secondary | ICD-10-CM | POA: Insufficient documentation

## 2021-03-20 NOTE — Progress Notes (Signed)
Patient ID: Denise Ali, female    DOB: [birth year], 70 y.o.   MRN: [id number]  Virtual visit completed through [employer], a video enabled telemedicine application. Due to national recommendations of social distancing due to COVID-19, a virtual visit is felt to be most appropriate for this patient at this time. Reviewed limitations, risks, security and privacy concerns of performing a virtual visit and the availability of in person appointments. I also reviewed that there may be a patient responsible charge related to this service. The patient agreed to proceed.   Patient location: home Provider location: [place] [facility], office Persons participating in this virtual visit: patient, provider   If any vitals were documented, they were collected by patient at home unless specified below.    There were no vitals taken for this visit.   CC: Covid 19 Reinfection  Subjective:   HPI: Denise Ali is a 70 y.o. female presenting on 03/20/2021 for acute video visit  Patient states approx 2 week ago that she went to [facility] and was prescribed paxlovid started feeling better. Went back to work last week  last Monday. Felt fatigue Saturday, ha, runny nose. Sunday the symptoms increased. Tested 03/19/2021 rapid that is positive  Tylenol not effective She has been vaccinated against covid x2 with 2 booster vaccinations.      Relevant past medical, surgical, family and social history reviewed and updated as indicated. Interim medical history since our last visit reviewed. Allergies and medications reviewed and updated. Outpatient Medications Prior to Visit  Medication Sig Dispense Refill   Calcium 1200-1000 MG-UNIT CHEW Chew 2 tablets by mouth daily.     Cholecalciferol (VITAMIN D) 50 MCG (2000 UT) CAPS Take 2,000 Units by mouth daily.      hydrochlorothiazide (HYDRODIURIL) 25 MG tablet Take 1 tablet (25 mg total) by mouth daily. 90 tablet 3   amoxicillin (AMOXIL) 500 MG tablet  Take 4 tablets 65mnutes prior to dental procedure. 4 tablet 0   aspirin EC 81 MG tablet Take 1 tablet (81 mg total) by mouth 2 (two) times daily. To be taken after surgery 84 tablet 0   docusate sodium (COLACE) 100 MG capsule Take 1 capsule (100 mg total) by mouth daily as needed. 30 capsule 2   methocarbamol (ROBAXIN) 500 MG tablet Take 1 tablet (500 mg total) by mouth 2 (two) times daily as needed. To be taken after surgery 20 tablet 0   ondansetron (ZOFRAN) 4 MG tablet Take 1 tablet (4 mg total) by mouth every 8 (eight) hours as needed for nausea or vomiting. 40 tablet 0   oxyCODONE-acetaminophen (PERCOCET) 5-325 MG tablet Take 1-2 tablets by mouth every 6 (six) hours as needed. To be taken after surgery 40 tablet 0   triamcinolone cream (KENALOG) 0.5 % Apply 1 application topically 2 (two) times daily. (Patient taking differently: Apply 1 application topically 2 (two) times daily as needed (itching).) 30 g 0   No facility-administered medications prior to visit.     Per HPI unless specifically indicated in ROS section below Review of Systems  Constitutional:  Positive for fatigue. Negative for chills and fever.  HENT:  Positive for sore throat.   Respiratory:  Positive for cough. Negative for shortness of breath.   Gastrointestinal:  Positive for diarrhea. Negative for nausea and vomiting.  Neurological:  Positive for headaches.  Objective:  There were no vitals taken for this visit.  Wt Readings from Last 3 Encounters:  01/09/21 214 lb 8 oz (  97.3 kg)  01/05/21 214 lb 8 oz (97.3 kg)  12/23/20 219 lb (99.3 kg)       Physical exam: Gen: alert, NAD, not ill appearing Pulm: speaks in complete sentences without increased work of breathing Psych: normal mood, normal thought content      Results for orders placed or performed during the hospital encounter of 01/09/21  CBC  Result Value Ref Range   WBC 9.1 4.0 - 10.5 K/uL   RBC 3.67 (L) 3.87 - 5.11 MIL/uL   Hemoglobin 11.4 (L) 12.0  - 15.0 g/dL   HCT 33.2 (L) 36.0 - 46.0 %   MCV 90.5 80.0 - 100.0 fL   MCH 31.1 26.0 - 34.0 pg   MCHC 34.3 30.0 - 36.0 g/dL   RDW 13.2 11.5 - 15.5 %   Platelets 183 150 - 400 K/uL   nRBC 0.0 0.0 - 0.2 %  Basic metabolic panel  Result Value Ref Range   Sodium 135 135 - 145 mmol/L   Potassium 3.7 3.5 - 5.1 mmol/L   Chloride 101 98 - 111 mmol/L   CO2 28 22 - 32 mmol/L   Glucose, Bld 153 (H) 70 - 99 mg/dL   BUN 13 8 - 23 mg/dL   Creatinine, Ser 0.63 0.44 - 1.00 mg/dL   Calcium 9.4 8.9 - 10.3 mg/dL   GFR, Estimated >60 >60 mL/min   Anion gap 6 5 - 15  ABO/Rh  Result Value Ref Range   ABO/RH(D)      A NEG Performed at [facility], [address] [place], [place] [address]    Assessment & Plan:   Problem List Items Addressed This Visit       Other   Reinfection by COVID-19 virus - Primary    Patient states approximately 2 weeks ago she felt ill.  She went to a local urgent care where she was tested for COVID-19 and it came back positive.  She was prescribed paxlovid.  She took the medication as directed and completed the course of therapy.  States thereafter she returned to work this past week.  The beginning Saturday, 03/18/2021 she began to feel ill symptoms progressed and tested herself Sunday, 03/19/2021 which came back positive.  Did tell patient there are known cases of rebound COVID when using paxlovid and that current guidelines do not recommend retreatment.  She acknowledged and understood.  Did review signs and symptoms that require urgent or emergent department care.  Patient acknowledged.        No orders of the defined types were placed in this encounter.  No orders of the defined types were placed in this encounter.   I discussed the assessment and treatment plan with the patient. The patient was provided an opportunity to ask questions and all were answered. The patient agreed with the plan and demonstrated an understanding of the instructions. The patient was  advised to call back or seek an in-person evaluation if the symptoms worsen or if the condition fails to improve as anticipated.  Follow up plan: No follow-ups on file.  Romilda Garret, NP

## 2021-03-20 NOTE — Assessment & Plan Note (Signed)
Patient states approximately 2 weeks ago she felt ill.  She went to a local urgent care where she was tested for COVID-19 and it came back positive.  She was prescribed paxlovid.  She took the medication as directed and completed the course of therapy.  States thereafter she returned to work this past week.  The beginning Saturday, 03/18/2021 she began to feel ill symptoms progressed and tested herself Sunday, 03/19/2021 which came back positive.  Did tell patient there are known cases of rebound COVID when using paxlovid and that current guidelines do not recommend retreatment.  She acknowledged and understood.  Did review signs and symptoms that require urgent or emergent department care.  Patient acknowledged.

## 2021-04-05 ENCOUNTER — Other Ambulatory Visit: Payer: Self-pay

## 2021-04-05 ENCOUNTER — Ambulatory Visit (INDEPENDENT_AMBULATORY_CARE_PROVIDER_SITE_OTHER): Payer: 59 | Admitting: Physician Assistant

## 2021-04-05 DIAGNOSIS — Z96642 Presence of left artificial hip joint: Secondary | ICD-10-CM

## 2021-04-05 NOTE — Progress Notes (Signed)
Post-Op Visit Note   Patient: Denise Ali           Date of Birth: 1951-06-28           MRN: [id number] Visit Date: 04/05/2021 PCP: Abner Greenspan, MD   Assessment & Plan:  Chief Complaint:  Chief Complaint  Patient presents with   Left Hip - Pain   Visit Diagnoses:  1. History of total left hip replacement     Plan: Patient is a pleasant 70 year old female who comes in today 3 months status post left total hip replacement 01/09/2021.  She has been doing well.  She notes occasional stiffness going from a seated to standing position but nothing more.  Examination of her left hip reveals full range of motion.  Pain with logroll.  She is neurovascular intact distally.  At this point, she will continue to advance with activity as tolerated.  Dental prophylaxis reinforced.  Follow-up with [place] in 3 months time for repeat evaluation.  Call with concerns or questions in the meantime.  Follow-Up Instructions: Return in about 3 months (around 07/05/2021).   Orders:  No orders of the defined types were placed in this encounter.  No orders of the defined types were placed in this encounter.   Imaging: No new imaging  PMFS History: Patient Active Problem List   Diagnosis Date Noted   Reinfection by COVID-19 virus 03/20/2021   Status post total replacement of left hip 01/09/2021   Primary osteoarthritis of left hip 09/21/2020   Left groin pain 09/14/2020   Pain in joint, ankle and foot, left 08/19/2018   Colon polyp 01/22/2018   Estrogen deficiency 10/09/2016   Elevated glucose level 10/09/2016   Obesity 12/24/2014   Hypertension 10/05/2014   Breast cancer screening, high risk patient 10/18/2011   Routine general medical examination at a health care facility 07/02/2011   Encounter for routine gynecological examination 07/02/2011   Irregular heart beat 04/09/2011   Tick bite 10/25/2010   HYPOKALEMIA 04/22/2009   Vitamin D deficiency 02/24/2009   STRESS REACTION, ACUTE,  WITH EMOTIONAL DISTURBANCE 11/25/2007   Osteopenia 01/14/2007   EDEMA 01/14/2007   Past Medical History:  Diagnosis Date   Anemia    Arthritis    Bradycardia 03/2011   mild - at one time with PACs/ asymptomatic    Breast cancer screening, high risk patient 10/18/2011   Cataracts, bilateral    Colon polyp 12/2007   Disorder of bone and cartilage, unspecified    Edema    Family history of malignant neoplasm of breast    evista for BRCA ppx   Fibrocystic breast    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   Osteopenia Sister    Breast cancer Sister    Breast cancer Other        cousin/Grandmother   Heart attack Paternal Grandfather    Colon cancer Neg Hx  Esophageal cancer Neg Hx    Rectal cancer Neg Hx    Stomach cancer Neg Hx     Past Surgical History:  Procedure Laterality Date   BREAST BIOPSY  01/20/2010   BREAST BIOPSY  02/20/2010   Fibrocystic change   Breast MRI  01/20/2010   Normal   cataracts  03/24/2011   bilateral   COLONOSCOPY W/ POLYPECTOMY  2019   DEXA  12/22/2003   osteopenia   DEXA  05/23/2006   stable   DEXA  07/23/2008   Osteopenia-slightly improved   EYE SURGERY Bilateral    cataracts removed   LEEP     HPV   procedure for "bad pap"     TONSILLECTOMY     TOTAL HIP ARTHROPLASTY Left 01/09/2021   Procedure: LEFT TOTAL HIP ARTHROPLASTY ANTERIOR APPROACH;  Surgeon: Leandrew Koyanagi, MD;  Location: [place];  Service: Orthopedics;  Laterality: Left;  C-3   Social History   Occupational History    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

## 2021-05-02 ENCOUNTER — Encounter: Payer: Self-pay | Admitting: Gastroenterology

## 2021-05-29 ENCOUNTER — Encounter: Payer: Self-pay | Admitting: Gastroenterology

## 2021-05-29 ENCOUNTER — Other Ambulatory Visit: Payer: Self-pay | Admitting: Orthopaedic Surgery

## 2021-06-13 ENCOUNTER — Other Ambulatory Visit: Payer: Self-pay

## 2021-06-13 ENCOUNTER — Encounter: Payer: Self-pay | Admitting: Family Medicine

## 2021-06-13 ENCOUNTER — Other Ambulatory Visit: Payer: Self-pay | Admitting: Family Medicine

## 2021-06-13 ENCOUNTER — Telehealth (INDEPENDENT_AMBULATORY_CARE_PROVIDER_SITE_OTHER): Payer: Medicare Other | Admitting: Family Medicine

## 2021-06-13 DIAGNOSIS — J069 Acute upper respiratory infection, unspecified: Secondary | ICD-10-CM | POA: Insufficient documentation

## 2021-06-13 NOTE — Patient Instructions (Signed)
  Drink fluids and rest  mucinex DM is good for cough and congestion (or just delsym if your cough is not productive) Nasal saline for congestion as needed  Tylenol for fever or pain or headache  Please alert [place] if symptoms worsen (if severe or short of breath please go to the ER)   The office will call you to set up testing for tomorrow

## 2021-06-13 NOTE — Assessment & Plan Note (Signed)
Negative rapid covid test at home  Tests ordered for rapid flu and covid pcr inst to isolate until results return and feeling better  Disc symptomatic care-tylenol and delsym/ plus or minus expectorant  Update if not starting to improve in a week or if worsening  ER precautions discussed

## 2021-06-13 NOTE — Progress Notes (Signed)
Virtual Visit via Video Note  I connected with Denise Ali on 06/13/21 at  4:00 PM EST by a video enabled telemedicine application and verified that I am speaking with the correct person using two identifiers.  Location: Patient: home Provider: office   I discussed the limitations of evaluation and management by telemedicine and the availability of in person appointments. The patient expressed understanding and agreed to proceed.  Parties involved in encounter  Patient: Denise Ali   Provider:  Loura Pardon Ali   History of Present Illness: Pt presents with fever and cough/congestion   Started feeling ill yesterday  Runny nose and slight headache  Sinus congestion  Chills as the day went on  Coughing more - not productive  Feels deep and at times hurts her ribs No wheezing  Throat is not sore    Ears are ok  Frontal/sinus headache   A little nausea last -from cough  No n/v/d today    Temp 100.8 today Home covid test was negative   Otc Tylenol  No cold medicines   Immunized for covid  She had covid in the summer  Flu shot : had it in October     Patient Active Problem List   Diagnosis Date Noted   Viral URI with cough 06/13/2021   Reinfection by COVID-19 virus 03/20/2021   Status post total replacement of left hip 01/09/2021   Primary osteoarthritis of left hip 09/21/2020   Left groin pain 09/14/2020   Pain in joint, ankle and foot, left 08/19/2018   Colon polyp 01/22/2018   Estrogen deficiency 10/09/2016   Elevated glucose level 10/09/2016   Obesity 12/24/2014   Hypertension 10/05/2014   Breast cancer screening, high risk patient 10/18/2011   Routine general medical examination at a health care facility 07/02/2011   Encounter for routine gynecological examination 07/02/2011   Irregular heart beat 04/09/2011   Tick bite 10/25/2010   HYPOKALEMIA 04/22/2009   Vitamin D deficiency 02/24/2009   STRESS REACTION, ACUTE, WITH EMOTIONAL  DISTURBANCE 11/25/2007   Osteopenia 01/14/2007   EDEMA 01/14/2007   Past Medical History:  Diagnosis Date   Anemia    Arthritis    Bradycardia 03/2011   mild - at one time with PACs/ asymptomatic    Breast cancer screening, high risk patient 10/18/2011   Cataracts, bilateral    Colon polyp 12/2007   Disorder of bone and cartilage, unspecified    Edema    Family history of malignant neoplasm of breast    evista for BRCA ppx   Fibrocystic breast    Human papillomavirus in conditions classified elsewhere and of unspecified site    Hypertension    Hypopotassemia    Neoplasm of uncertain behavior of skin    Osteopenia    Other screening mammogram    Pneumonia    Predominant disturbance of emotions    Routine gynecological examination    Seasonal allergies    Unspecified vitamin D deficiency    Past Surgical History:  Procedure Laterality Date   BREAST BIOPSY  01/20/2010   BREAST BIOPSY  02/20/2010   Fibrocystic change   Breast MRI  01/20/2010   Normal   cataracts  03/24/2011   bilateral   COLONOSCOPY W/ POLYPECTOMY  2019   DEXA  12/22/2003   osteopenia   DEXA  05/23/2006   stable   DEXA  07/23/2008   Osteopenia-slightly improved   EYE SURGERY Bilateral    cataracts removed   LEEP  HPV   procedure for "bad pap"     TONSILLECTOMY     TOTAL HIP ARTHROPLASTY Left 01/09/2021   Procedure: LEFT TOTAL HIP ARTHROPLASTY ANTERIOR APPROACH;  Surgeon: Leandrew Koyanagi, Ali;  Location: [place];  Service: Orthopedics;  Laterality: Left;  C-3   Social History   Tobacco Use   Smoking status: Former    Types: Cigarettes    Quit date: 07/23/1985    Years since quitting: 35.9   Smokeless tobacco: Never   Tobacco comments:    Quit "years ago"  Vaping Use   Vaping Use: Never used  Substance Use Topics   Alcohol use: Yes    Alcohol/week: 0.0 standard drinks    Comment: Occasional   Drug use: No   Family History  Problem Relation Age of Onset   Breast cancer Mother     Osteoporosis Mother    Diabetes Father    Skin cancer Father        ?   Osteopenia Sister    Breast cancer Sister    Breast cancer Other        cousin/Grandmother   Heart attack Paternal Grandfather    Colon cancer Neg Hx    Esophageal cancer Neg Hx    Rectal cancer Neg Hx    Stomach cancer Neg Hx    Allergies  Allergen Reactions   Alendronate Sodium Other (See Comments)    REACTION: leg and joint pain   Current Outpatient Medications on File Prior to Visit  Medication Sig Dispense Refill   amoxicillin (AMOXIL) 500 MG capsule TAKE 4 CAPSULES 30 MINUTES PRIOR TO DENTAL PROCEDURE. 8 capsule 2   Calcium 1200-1000 MG-UNIT CHEW Chew 2 tablets by mouth daily.     Cholecalciferol (VITAMIN D) 50 MCG (2000 UT) CAPS Take 2,000 Units by mouth daily.      hydrochlorothiazide (HYDRODIURIL) 25 MG tablet Take 1 tablet (25 mg total) by mouth daily. 90 tablet 3   No current facility-administered medications on file prior to visit.   Review of Systems  Constitutional:  Positive for fever. Negative for chills and malaise/fatigue.  HENT:  Positive for congestion. Negative for ear pain, sinus pain and sore throat.   Eyes:  Negative for blurred vision, discharge and redness.  Respiratory:  Positive for cough. Negative for sputum production, shortness of breath, wheezing and stridor.   Cardiovascular:  Negative for chest pain, palpitations and leg swelling.  Gastrointestinal:  Negative for abdominal pain, diarrhea, nausea and vomiting.  Musculoskeletal:  Negative for myalgias.  Skin:  Negative for rash.  Neurological:  Positive for headaches. Negative for dizziness.   Observations/Objective: Patient appears well, in no distress Weight is baseline  No facial swelling or asymmetry Mildly hoarse voice, clears throat often No obvious tremor or mobility impairment Moving neck and UEs normally Able to hear the call well  No wheeze or shortness of breath during interview  Nl cognition- good  historian Nl mood   Assessment and Plan:  Problem List Items Addressed This Visit       Respiratory   Viral URI with cough    Negative rapid covid test at home  Tests ordered for rapid flu and covid pcr inst to isolate until results return and feeling better  Disc symptomatic care-tylenol and delsym/ plus or minus expectorant  Update if not starting to improve in a week or if worsening  ER precautions discussed        Follow Up Instructions: Drink fluids and rest  mucinex DM is good for cough and congestion (or just delsym if your cough is not productive) Nasal saline for congestion as needed  Tylenol for fever or pain or headache  Please alert [place] if symptoms worsen (if severe or short of breath please go to the ER)   The office will call you to set up testing for tomorrow   I discussed the assessment and treatment plan with the patient. The patient was provided an opportunity to ask questions and all were answered. The patient agreed with the plan and demonstrated an understanding of the instructions.   The patient was advised to call back or seek an in-person evaluation if the symptoms worsen or if the condition fails to improve as anticipated.     Denise Pardon, Ali

## 2021-06-14 ENCOUNTER — Other Ambulatory Visit (INDEPENDENT_AMBULATORY_CARE_PROVIDER_SITE_OTHER): Payer: Medicare Other

## 2021-06-14 ENCOUNTER — Other Ambulatory Visit: Payer: Self-pay

## 2021-06-14 ENCOUNTER — Telehealth: Payer: Self-pay | Admitting: Family Medicine

## 2021-06-14 DIAGNOSIS — J069 Acute upper respiratory infection, unspecified: Secondary | ICD-10-CM

## 2021-06-14 LAB — POC INFLUENZA A&B (BINAX/QUICKVUE)
Influenza A, POC: POSITIVE — AB
Influenza B, POC: NEGATIVE

## 2021-06-14 MED ORDER — OSELTAMIVIR PHOSPHATE 75 MG PO CAPS: 75.0000 mg | ORAL_CAPSULE | Freq: Two times a day (BID) | ORAL | 0 refills | Status: DC

## 2021-06-14 NOTE — Telephone Encounter (Signed)
Positive flu test  I sent tamiflu to her pharmacy 75 mg bid  If problems or side eff let us know   Treat symptoms  Drink fluids and rest  Isolate until symptoms are better   If any severe symptoms or sob alert [place] and go to ER

## 2021-06-14 NOTE — Telephone Encounter (Signed)
Pt notified of flu test and Dr. Marliss Coots comments and instructions and pt verbalized understanding

## 2021-06-15 LAB — NOVEL CORONAVIRUS, NAA: SARS-CoV-2, NAA: NOT DETECTED

## 2021-06-15 LAB — SARS-COV-2, NAA 2 DAY TAT

## 2021-06-27 ENCOUNTER — Encounter: Payer: 59 | Admitting: Gastroenterology

## 2021-07-05 ENCOUNTER — Ambulatory Visit: Payer: Self-pay

## 2021-07-05 ENCOUNTER — Other Ambulatory Visit: Payer: Self-pay

## 2021-07-05 ENCOUNTER — Ambulatory Visit (INDEPENDENT_AMBULATORY_CARE_PROVIDER_SITE_OTHER): Payer: Medicare Other | Admitting: Orthopaedic Surgery

## 2021-07-05 ENCOUNTER — Encounter: Payer: Self-pay | Admitting: Orthopaedic Surgery

## 2021-07-05 DIAGNOSIS — Z96642 Presence of left artificial hip joint: Secondary | ICD-10-CM

## 2021-07-05 NOTE — Progress Notes (Signed)
Post-Op Visit Note   Patient: Denise Ali           Date of Birth: 1951/05/14           MRN: [id number] Visit Date: 07/05/2021 PCP: Abner Greenspan, MD   Assessment & Plan:  Chief Complaint:  Chief Complaint  Patient presents with   Left Hip - Follow-up   Visit Diagnoses:  1. Status post total replacement of left hip     Plan: Patient is a pleasant 70 year old female who comes in today 6 months out left total hip replacement 01/09/2021.  She has been doing well.  She notes slight stiffness when she first stands up but nothing more.  This is continued to improve over the past few months.  Examination of her left hip reveals a painless logroll.  Full hip flexion.  She is neurovascular intact distally.  At this point, she will continue to advance with activity as tolerated.  Dental prophylaxis reinforced.  Follow-up with [place] in 6 months time for repeat evaluation AP pelvis x-rays.  Call with concerns or questions in the meantime.  Follow-Up Instructions: Return in about 6 months (around 01/03/2022).   Orders:  Orders Placed This Encounter  Procedures   XR HIP UNILAT W OR W/O PELVIS 2-3 VIEWS LEFT   No orders of the defined types were placed in this encounter.   Imaging: XR HIP UNILAT W OR W/O PELVIS 2-3 VIEWS LEFT  Result Date: 07/05/2021 X-rays reveal well-seated prosthesis without complication   PMFS History: Patient Active Problem List   Diagnosis Date Noted   Viral URI with cough 06/13/2021   Reinfection by COVID-19 virus 03/20/2021   Status post total replacement of left hip 01/09/2021   Primary osteoarthritis of left hip 09/21/2020   Left groin pain 09/14/2020   Pain in joint, ankle and foot, left 08/19/2018   Colon polyp 01/22/2018   Estrogen deficiency 10/09/2016   Elevated glucose level 10/09/2016   Obesity 12/24/2014   Hypertension 10/05/2014   Breast cancer screening, high risk patient 10/18/2011   Routine general medical examination at a health  care facility 07/02/2011   Encounter for routine gynecological examination 07/02/2011   Irregular heart beat 04/09/2011   Tick bite 10/25/2010   HYPOKALEMIA 04/22/2009   Vitamin D deficiency 02/24/2009   STRESS REACTION, ACUTE, WITH EMOTIONAL DISTURBANCE 11/25/2007   Osteopenia 01/14/2007   EDEMA 01/14/2007   Past Medical History:  Diagnosis Date   Anemia    Arthritis    Bradycardia 03/2011   mild - at one time with PACs/ asymptomatic    Breast cancer screening, high risk patient 10/18/2011   Cataracts, bilateral    Colon polyp 12/2007   Disorder of bone and cartilage, unspecified    Edema    Family history of malignant neoplasm of breast    evista for BRCA ppx   Fibrocystic breast    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  Osteopenia Sister    Breast cancer Sister    Breast cancer Other        cousin/Grandmother   Heart attack Paternal Grandfather    Colon cancer Neg Hx    Esophageal cancer Neg Hx    Rectal cancer Neg Hx    Stomach cancer Neg Hx     Past Surgical History:  Procedure Laterality Date   BREAST BIOPSY  01/20/2010   BREAST BIOPSY  02/20/2010   Fibrocystic change   Breast MRI  01/20/2010   Normal   cataracts  03/24/2011   bilateral   COLONOSCOPY W/ POLYPECTOMY  2019   DEXA  12/22/2003   osteopenia   DEXA  05/23/2006   stable   DEXA  07/23/2008   Osteopenia-slightly improved   EYE SURGERY Bilateral    cataracts removed   LEEP     HPV   procedure for "bad pap"     TONSILLECTOMY     TOTAL  HIP ARTHROPLASTY Left 01/09/2021   Procedure: LEFT TOTAL HIP ARTHROPLASTY ANTERIOR APPROACH;  Surgeon: Leandrew Koyanagi, MD;  Location: [place];  Service: Orthopedics;  Laterality: Left;  C-3   Social History   Occupational History   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

## 2021-07-06 LAB — HM MAMMOGRAPHY

## 2021-07-13 ENCOUNTER — Encounter: Payer: Self-pay | Admitting: Family Medicine

## 2021-07-23 HISTORY — PX: SKIN BIOPSY: SHX1

## 2021-08-04 ENCOUNTER — Encounter: Payer: Self-pay | Admitting: Gastroenterology

## 2021-08-04 ENCOUNTER — Ambulatory Visit (AMBULATORY_SURGERY_CENTER): Payer: Medicare Other | Admitting: *Deleted

## 2021-08-04 ENCOUNTER — Other Ambulatory Visit: Payer: Self-pay

## 2021-08-04 VITALS — Ht 64.0 in | Wt 210.0 lb

## 2021-08-04 DIAGNOSIS — Z86010 Personal history of colonic polyps: Secondary | ICD-10-CM

## 2021-08-04 MED ORDER — NA SULFATE-K SULFATE-MG SULF 17.5-3.13-1.6 GM/177ML PO SOLN: 1.0000 | Freq: Once | ORAL | 0 refills | Status: AC

## 2021-08-04 NOTE — Progress Notes (Signed)

## 2021-08-10 ENCOUNTER — Encounter: Payer: Self-pay | Admitting: Orthopaedic Surgery

## 2021-08-11 ENCOUNTER — Other Ambulatory Visit: Payer: Self-pay | Admitting: Physician Assistant

## 2021-08-11 MED ORDER — AMOXICILLIN 500 MG PO CAPS: ORAL_CAPSULE | ORAL | 2 refills | Status: DC

## 2021-08-11 NOTE — Telephone Encounter (Signed)
Yes, we recommend one hour prior to procedure and I sent in refill

## 2021-08-17 ENCOUNTER — Encounter: Payer: Self-pay | Admitting: Certified Registered Nurse Anesthetist

## 2021-08-18 ENCOUNTER — Other Ambulatory Visit: Payer: Self-pay

## 2021-08-18 ENCOUNTER — Ambulatory Visit (AMBULATORY_SURGERY_CENTER): Payer: Medicare Other | Admitting: Gastroenterology

## 2021-08-18 ENCOUNTER — Encounter: Payer: Self-pay | Admitting: Gastroenterology

## 2021-08-18 VITALS — BP 118/47 | HR 54 | Temp 97.3°F | Resp 12 | Ht 64.0 in | Wt 210.0 lb

## 2021-08-18 DIAGNOSIS — D125 Benign neoplasm of sigmoid colon: Secondary | ICD-10-CM

## 2021-08-18 DIAGNOSIS — K635 Polyp of colon: Secondary | ICD-10-CM

## 2021-08-18 DIAGNOSIS — Z86010 Personal history of colonic polyps: Secondary | ICD-10-CM | POA: Diagnosis not present

## 2021-08-18 MED ORDER — SODIUM CHLORIDE 0.9 % IV SOLN: 500.0000 mL | Freq: Once | INTRAVENOUS | Status: DC

## 2021-08-18 NOTE — Progress Notes (Signed)
colonoscopy 2009 102m adenoma; colonoscopy 2014 two subCM polyps, one was an adenoma; Colonoscopy 03/2018 four subCM polyps (three TAs and one SSP).   HPI: This is a woman with h/o polyps   ROS: complete GI ROS as described in HPI, all other review negative.  Constitutional:  No unintentional weight loss   Past Medical History:  Diagnosis Date   Anemia    Arthritis    Bradycardia 03/2011   mild - at one time with PACs/ asymptomatic    Breast cancer screening, high risk patient 10/18/2011   Cataracts, bilateral    Colon polyp 12/2007   Disorder of bone and cartilage, unspecified    Edema    Family history of malignant neoplasm of breast    evista for BRCA ppx   Fibrocystic breast    Human papillomavirus in conditions classified elsewhere and of unspecified site    Hypertension    Hypopotassemia    Neoplasm of uncertain behavior of skin    Osteopenia    Osteoporosis    Other screening mammogram    Pneumonia    Predominant disturbance of emotions    Routine gynecological examination    Seasonal allergies    Unspecified vitamin D deficiency     Past Surgical History:  Procedure Laterality Date   BREAST BIOPSY  01/20/2010   BREAST BIOPSY  02/20/2010   Fibrocystic change   Breast MRI  01/20/2010   Normal   cataracts  03/24/2011   bilateral   COLONOSCOPY     COLONOSCOPY W/ POLYPECTOMY  2019   DEXA  12/22/2003   osteopenia   DEXA  05/23/2006   stable   DEXA  07/23/2008   Osteopenia-slightly improved   EYE SURGERY Bilateral    cataracts removed   LEEP     HPV   POLYPECTOMY     procedure for "bad pap"     TONSILLECTOMY     TOTAL HIP ARTHROPLASTY Left 01/09/2021   Procedure: LEFT TOTAL HIP ARTHROPLASTY ANTERIOR APPROACH;  Surgeon: XLeandrew Koyanagi MD;  Location[place]  Service: Orthopedics;  Laterality: Left;  C-3    Current Outpatient Medications  Medication Sig Dispense Refill   acetaminophen (TYLENOL) 500 MG tablet Take 500 mg by mouth as needed.      amoxicillin (AMOXIL) 500 MG capsule TAKE 4 CAPSULES 30 MINUTES PRIOR TO DENTAL PROCEDURE/colonoscopy 8 capsule 2   Calcium 1200-1000 MG-UNIT CHEW Chew 2 tablets by mouth daily.     Cholecalciferol (VITAMIN D) 50 MCG (2000 UT) CAPS Take 2,000 Units by mouth daily.      hydrochlorothiazide (HYDRODIURIL) 25 MG tablet Take 1 tablet (25 mg total) by mouth daily. 90 tablet 3   oseltamivir (TAMIFLU) 75 MG capsule Take 1 capsule (75 mg total) by mouth 2 (two) times daily. 10 capsule 0   Current Facility-Administered Medications  Medication Dose Route Frequency Provider Last Rate Last Admin   0.9 %  sodium chloride infusion  500 mL Intravenous Once JMilus Banister MD        Allergies as of 08/18/2021 - Review Complete 08/18/2021  Allergen Reaction Noted   Alendronate sodium Other (See Comments) 02/24/2009    Family History  Problem Relation Age of Onset   Breast cancer Mother    Osteoporosis Mother    Diabetes Father    Skin cancer Father        ?   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  Tobacco Use   Smoking status: Former    Types: Cigarettes    Quit date: 07/23/1985    Years since quitting: 36.0   Smokeless tobacco: Never   Tobacco comments:    Quit "years ago"  Vaping Use   Vaping Use: Never used  Substance and Sexual Activity   Alcohol use: Yes    Alcohol/week: 0.0 standard drinks    Comment: Occasional   Drug use: No   Sexual activity: Not Currently  Other Topics Concern   Not on file   Social History Narrative   Works very long hours      Quit smoking year ago            Social Determinants of Radio broadcast assistant Strain: Not on Art therapist Insecurity: Not on file  Transportation Needs: Not on file  Physical Activity: Not on file  Stress: Not on file  Social Connections: Not on file  Intimate Partner Violence: Not on file     Physical Exam: BP 130/81    Pulse 72    Temp (!) 97.3 F (36.3 C) (Temporal)    Ht '5\' 4"'  (1.626 m)    Wt 210 lb (95.3 kg)    SpO2 96%    BMI 36.05 kg/m  Constitutional: generally well-appearing Psychiatric: alert and oriented x3 Lungs: CTA bilaterally Heart: no MCR  Assessment and plan: 71 y.o. female with h/o polyps  Surveillance colonsocopy today  Care is appropriate for the ambulatory setting.  Owens Loffler, MD [facility] 08/18/2021, 7:33 AM

## 2021-08-18 NOTE — Progress Notes (Signed)
0755 IV pulled out due to taped to blanket, restarted in left hand 24 gauge without problems.  2 attempts made with 1st one vein collapsed. vss

## 2021-08-18 NOTE — Op Note (Signed)
[facility] Patient Name: Denise Ali Procedure Date: 08/18/2021 7:47 AM MRN: [id number] Endoscopist: Milus Banister , MD Age: 71 Referring MD:  Date of Birth: 14-Mar-1951 Gender: Female Account #: [id number] Procedure:                Colonoscopy Indications:              High risk colon cancer surveillance: Personal                            history of colonic polyps; colonoscopy 2009 82mm                            adenoma; colonoscopy 2014 two subCM polyps, one was                            an adenoma; Colonoscopy 03/2018 four subCM polyps                            (three TAs and one SSP). Medicines:                Monitored Anesthesia Care Procedure:                Pre-Anesthesia Assessment:                           - Prior to the procedure, a History and Physical                            was performed, and patient medications and                            allergies were reviewed. The patient's tolerance of                            previous anesthesia was also reviewed. The risks                            and benefits of the procedure and the sedation                            options and risks were discussed with the patient.                            All questions were answered, and informed consent                            was obtained. Prior Anticoagulants: The patient has                            taken no previous anticoagulant or antiplatelet                            agents. ASA Grade Assessment: II - A patient with  mild systemic disease. After reviewing the risks                            and benefits, the patient was deemed in                            satisfactory condition to undergo the procedure.                           After obtaining informed consent, the colonoscope                            was passed under direct vision. Throughout the                            procedure, the patient's blood  pressure, pulse, and                            oxygen saturations were monitored continuously. The                            CF [id number] #[id number] was introduced through the anus                            and advanced to the the cecum, identified by                            appendiceal orifice and ileocecal valve. The                            colonoscopy was performed without difficulty. The                            patient tolerated the procedure well. The quality                            of the bowel preparation was good. The ileocecal                            valve, appendiceal orifice, and rectum were                            photographed. Scope In: 8:01:31 AM Scope Out: 8:12:31 AM Scope Withdrawal Time: 0 hours 8 minutes 52 seconds  Total Procedure Duration: 0 hours 11 minutes 0 seconds  Findings:                 A 3 mm polyp was found in the sigmoid colon. The                            polyp was sessile. The polyp was removed with a                            cold snare. Resection and retrieval were complete.  The exam was otherwise without abnormality on                            direct and retroflexion views. Complications:            No immediate complications. Estimated blood loss:                            None. Estimated Blood Loss:     Estimated blood loss: none. Impression:               - One 3 mm polyp in the sigmoid colon, removed with                            a cold snare. Resected and retrieved.                           - The examination was otherwise normal on direct                            and retroflexion views. Recommendation:           - Patient has a contact number available for                            emergencies. The signs and symptoms of potential                            delayed complications were discussed with the                            patient. Return to normal activities tomorrow.                             Written discharge instructions were provided to the                            patient.                           - Resume previous diet.                           - Continue present medications.                           - Await pathology results. Milus Banister, MD 08/18/2021 8:16:21 AM This report has been signed electronically.

## 2021-08-18 NOTE — Progress Notes (Signed)
Pt's states no medical or surgical changes since previsit or office visit. 

## 2021-08-18 NOTE — Progress Notes (Signed)
Report given to PACU, vss 

## 2021-08-18 NOTE — Patient Instructions (Signed)
Handouts on polyps given to you today  Await pathology results   YOU HAD AN ENDOSCOPIC PROCEDURE TODAY AT [facility]:   Refer to the procedure report that was given to you for any specific questions about what was found during the examination.  If the procedure report does not answer your questions, please call your gastroenterologist to clarify.  If you requested that your care partner not be given the details of your procedure findings, then the procedure report has been included in a sealed envelope for you to review at your convenience later.  YOU SHOULD EXPECT: Some feelings of bloating in the abdomen. Passage of more gas than usual.  Walking can help get rid of the air that was put into your GI tract during the procedure and reduce the bloating. If you had a lower endoscopy (such as a colonoscopy or flexible sigmoidoscopy) you may notice spotting of blood in your stool or on the toilet paper. If you underwent a bowel prep for your procedure, you may not have a normal bowel movement for a few days.  Please Note:  You might notice some irritation and congestion in your nose or some drainage.  This is from the oxygen used during your procedure.  There is no need for concern and it should clear up in a day or so.  SYMPTOMS TO REPORT IMMEDIATELY:  Following lower endoscopy (colonoscopy or flexible sigmoidoscopy):  Excessive amounts of blood in the stool  Significant tenderness or worsening of abdominal pains  Swelling of the abdomen that is new, acute  Fever of 100F or higher  For urgent or emergent issues, a gastroenterologist can be reached at any hour by calling [phone]. Do not use MyChart messaging for urgent concerns.    DIET:  We do recommend a small meal at first, but then you may proceed to your regular diet.  Drink plenty of fluids but you should avoid alcoholic beverages for 24 hours.  ACTIVITY:  You should plan to take it easy for the rest of today and you  should NOT DRIVE or use heavy machinery until tomorrow (because of the sedation medicines used during the test).    FOLLOW UP: Our staff will call the number listed on your records 48-72 hours following your procedure to check on you and address any questions or concerns that you may have regarding the information given to you following your procedure. If we do not reach you, we will leave a message.  We will attempt to reach you two times.  During this call, we will ask if you have developed any symptoms of COVID 19. If you develop any symptoms (ie: fever, flu-like symptoms, shortness of breath, cough etc.) before then, please call [phone].  If you test positive for Covid 19 in the 2 weeks post procedure, please call and report this information to [place].    If any biopsies were taken you will be contacted by phone or by letter within the next 1-3 weeks.  Please call us at [phone] if you have not heard about the biopsies in 3 weeks.    SIGNATURES/CONFIDENTIALITY: You and/or your care partner have signed paperwork which will be entered into your electronic medical record.  These signatures attest to the fact that that the information above on your After Visit Summary has been reviewed and is understood.  Full responsibility of the confidentiality of this discharge information lies with you and/or your care-partner.

## 2021-08-18 NOTE — Progress Notes (Signed)
Called to room to assist during endoscopic procedure.  Patient ID and intended procedure confirmed with present staff. Received instructions for my participation in the procedure from the performing physician.  

## 2021-08-22 ENCOUNTER — Telehealth: Payer: Self-pay

## 2021-08-22 NOTE — Telephone Encounter (Signed)
°  Follow up Call-  Call back number 08/18/2021  Post procedure Call Back phone  # [phone]  Permission to leave phone message Yes  Some recent data might be hidden     Patient questions:  Do you have a fever, pain , or abdominal swelling? No. Pain Score  0 *  Have you tolerated food without any problems? Yes.    Have you been able to return to your normal activities? Yes.    Do you have any questions about your discharge instructions: Diet   No. Medications  No. Follow up visit  No.  Do you have questions or concerns about your Care? No.  Actions: * If pain score is 4 or above: No action needed, pain <4.  Have you developed a fever since your procedure? no  2.   Have you had an respiratory symptoms (SOB or cough) since your procedure? no  3.   Have you tested positive for COVID 19 since your procedure no  4.   Have you had any family members/close contacts diagnosed with the COVID 19 since your procedure?  no   If yes to any of these questions please route to Joylene John, RN and Joella Prince, RN

## 2021-08-23 ENCOUNTER — Encounter: Payer: Self-pay | Admitting: Gastroenterology

## 2021-09-12 ENCOUNTER — Telehealth: Payer: Self-pay | Admitting: Family Medicine

## 2021-09-12 DIAGNOSIS — I10 Essential (primary) hypertension: Secondary | ICD-10-CM

## 2021-09-12 DIAGNOSIS — R7309 Other abnormal glucose: Secondary | ICD-10-CM

## 2021-09-12 DIAGNOSIS — E559 Vitamin D deficiency, unspecified: Secondary | ICD-10-CM

## 2021-09-12 NOTE — Telephone Encounter (Signed)
-----   Message from Ellamae Sia sent at 08/28/2021 12:15 PM EST ----- Regarding: Lab orders for Wednesday, 2.22.23 Patient is scheduled for CPX labs, please order future labs, Thanks , Karna Christmas

## 2021-09-13 ENCOUNTER — Other Ambulatory Visit (INDEPENDENT_AMBULATORY_CARE_PROVIDER_SITE_OTHER): Payer: Medicare Other

## 2021-09-13 ENCOUNTER — Other Ambulatory Visit: Payer: Self-pay

## 2021-09-13 DIAGNOSIS — I10 Essential (primary) hypertension: Secondary | ICD-10-CM | POA: Diagnosis not present

## 2021-09-13 DIAGNOSIS — R7309 Other abnormal glucose: Secondary | ICD-10-CM | POA: Diagnosis not present

## 2021-09-13 DIAGNOSIS — E559 Vitamin D deficiency, unspecified: Secondary | ICD-10-CM

## 2021-09-13 LAB — LIPID PANEL
Cholesterol: 191 mg/dL (ref 0–200)
HDL: 67.6 mg/dL (ref >39.00)
LDL Cholesterol: 104 mg/dL — ABNORMAL HIGH (ref 0–99)
NonHDL: 122.9
Total CHOL/HDL Ratio: 3
Triglycerides: 95 mg/dL (ref 0.0–149.0)
VLDL: 19 mg/dL (ref 0.0–40.0)

## 2021-09-13 LAB — CBC WITH DIFFERENTIAL/PLATELET
Basophils Absolute: 0 10*3/uL (ref 0.0–0.1)
Basophils Relative: 0.6 % (ref 0.0–3.0)
Eosinophils Absolute: 0.1 10*3/uL (ref 0.0–0.7)
Eosinophils Relative: 1.6 % (ref 0.0–5.0)
HCT: 39.4 % (ref 36.0–46.0)
Hemoglobin: 13 g/dL (ref 12.0–15.0)
Lymphocytes Relative: 28.6 % (ref 12.0–46.0)
Lymphs Abs: 1.6 10*3/uL (ref 0.7–4.0)
MCHC: 33 g/dL (ref 30.0–36.0)
MCV: 91.3 fl (ref 78.0–100.0)
Monocytes Absolute: 0.6 10*3/uL (ref 0.1–1.0)
Monocytes Relative: 10 % (ref 3.0–12.0)
Neutro Abs: 3.3 10*3/uL (ref 1.4–7.7)
Neutrophils Relative %: 59.2 % (ref 43.0–77.0)
Platelets: 236 10*3/uL (ref 150.0–400.0)
RBC: 4.32 Mil/uL (ref 3.87–5.11)
RDW: 14.1 % (ref 11.5–15.5)
WBC: 5.6 10*3/uL (ref 4.0–10.5)

## 2021-09-13 LAB — COMPREHENSIVE METABOLIC PANEL
ALT: 13 U/L (ref 0–35)
AST: 15 U/L (ref 0–37)
Albumin: 4.1 g/dL (ref 3.5–5.2)
Alkaline Phosphatase: 63 U/L (ref 39–117)
BUN: 19 mg/dL (ref 6–23)
CO2: 35 mEq/L — ABNORMAL HIGH (ref 19–32)
Calcium: 10.5 mg/dL (ref 8.4–10.5)
Chloride: 102 mEq/L (ref 96–112)
Creatinine, Ser: 0.8 mg/dL (ref 0.40–1.20)
GFR: 74.8 mL/min (ref >60.00)
Glucose, Bld: 109 mg/dL — ABNORMAL HIGH (ref 70–99)
Potassium: 3.9 mEq/L (ref 3.5–5.1)
Sodium: 138 mEq/L (ref 135–145)
Total Bilirubin: 0.9 mg/dL (ref 0.2–1.2)
Total Protein: 6.7 g/dL (ref 6.0–8.3)

## 2021-09-13 LAB — VITAMIN D 25 HYDROXY (VIT D DEFICIENCY, FRACTURES): VITD: 51.88 ng/mL (ref 30.00–100.00)

## 2021-09-13 LAB — HEMOGLOBIN A1C: Hgb A1c MFr Bld: 6 % (ref 4.6–6.5)

## 2021-09-13 LAB — TSH: TSH: 3.23 u[IU]/mL (ref 0.35–5.50)

## 2021-09-20 ENCOUNTER — Encounter: Payer: Self-pay | Admitting: Family Medicine

## 2021-09-20 ENCOUNTER — Other Ambulatory Visit: Payer: Self-pay

## 2021-09-20 ENCOUNTER — Ambulatory Visit (INDEPENDENT_AMBULATORY_CARE_PROVIDER_SITE_OTHER): Payer: Medicare Other | Admitting: Family Medicine

## 2021-09-20 VITALS — BP 134/62 | HR 61 | Temp 97.9°F | Ht 63.75 in | Wt 214.0 lb

## 2021-09-20 DIAGNOSIS — Z6837 Body mass index (BMI) 37.0-37.9, adult: Secondary | ICD-10-CM

## 2021-09-20 DIAGNOSIS — Z0000 Encounter for general adult medical examination without abnormal findings: Secondary | ICD-10-CM | POA: Insufficient documentation

## 2021-09-20 DIAGNOSIS — I10 Essential (primary) hypertension: Secondary | ICD-10-CM

## 2021-09-20 DIAGNOSIS — D126 Benign neoplasm of colon, unspecified: Secondary | ICD-10-CM

## 2021-09-20 DIAGNOSIS — R7309 Other abnormal glucose: Secondary | ICD-10-CM

## 2021-09-20 DIAGNOSIS — E2839 Other primary ovarian failure: Secondary | ICD-10-CM

## 2021-09-20 DIAGNOSIS — Z1239 Encounter for other screening for malignant neoplasm of breast: Secondary | ICD-10-CM

## 2021-09-20 DIAGNOSIS — E6609 Other obesity due to excess calories: Secondary | ICD-10-CM

## 2021-09-20 DIAGNOSIS — M8588 Other specified disorders of bone density and structure, other site: Secondary | ICD-10-CM | POA: Diagnosis not present

## 2021-09-20 DIAGNOSIS — E559 Vitamin D deficiency, unspecified: Secondary | ICD-10-CM

## 2021-09-20 MED ORDER — HYDROCHLOROTHIAZIDE 25 MG PO TABS: 25.0000 mg | ORAL_TABLET | Freq: Every day | ORAL | 3 refills | Status: DC

## 2021-09-20 NOTE — Assessment & Plan Note (Signed)
bp in fair control at this time  ?BP Readings from Last 1 Encounters:  ?09/20/21 134/62  ? ?No changes needed ?Most recent labs reviewed  ?Disc lifstyle change with low sodium diet and exercise  ?Plan to continue hctz 25 mg daily ?

## 2021-09-20 NOTE — Assessment & Plan Note (Signed)
MRI is an option-pt plans to check on coverage  ?For now just thinking about it  ?Nl mammogram and exam ?Enc self exams  ?

## 2021-09-20 NOTE — Assessment & Plan Note (Signed)
Reviewed health habits including diet and exercise and skin cancer prevention ?Reviewed appropriate screening tests for age  ?Also reviewed health mt list, fam hx and immunization status , as well as social and family history   ?See HPI ?Labs reviewed  ?Colonoscopy utd ?Mammogram utd, considering mri ?Plans to check on coverage of shingrix ?dexa ordered  ?Fall pre discussed  ?Given materials to work on advance directive ?No cognitive concerns ?Reviewed hearing and vision screen /pt is not bothered by hearing  ?PHQ score is 0  ?No help needed with ADLs ?Good functionality ?

## 2021-09-20 NOTE — Assessment & Plan Note (Signed)
D level is 51.8 ? ?Vitamin D level is therapeutic with current supplementation ?Disc importance of this to bone and overall health ? ?

## 2021-09-20 NOTE — Progress Notes (Signed)
Subjective:    Patient ID: Denise Ali, female    DOB: [birth year], 71 y.o.   MRN: [id number]  This visit occurred during the SARS-CoV-2 public health emergency.  Safety protocols were in place, including screening questions prior to the visit, additional usage of staff PPE, and extensive cleaning of exam room while observing appropriate contact time as indicated for disinfecting solutions.   HPI Pt presents for amw and health mt visit   I have personally reviewed the Medicare Annual Wellness questionnaire and have noted 1. The patient's medical and social history 2. Their use of alcohol, tobacco or illicit drugs 3. Their current medications and supplements 4. The patient's functional ability including ADL's, fall risks, home safety risks and hearing or visual             impairment. 5. Diet and physical activities 6. Evidence for depression or mood disorders  The patients weight, height, BMI have been recorded in the chart and visual acuity is per eye clinic.  I have made referrals, counseling and provided education to the patient based review of the above and I have provided the pt with a written personalized care plan for preventive services. Reviewed and updated provider list, see scanned forms.  See scanned forms.  Routine anticipatory guidance given to patient.  See health maintenance. Colon cancer screening  colonoscopy 07/2021 with 5 y recall/ history of polyps  Breast cancer screening  06/2021 mammogram  High risk patient , MRI was recommended but she elected not due to lack of coverage  Self breast exam-no lumps or changes  Flu vaccine 04/2021 Covid immunized -wants booster Tetanus vaccine  Tdap 03/2013 Pneumovax-up to date  Zoster vaccine had live vaccine in 2016  Interested in shingrix If affordable  Dexa  05/2019   Osteopenia  Falls: one before her hip surgery  Fractures: none  Supplements calcium and vitamin D Exercise : walking    Advance directive:  given materials to do  Cognitive function addressed- see scanned forms- and if abnormal then additional documentation follows.   No concerns about memory or cognition  Handles own affairs   PMH and SH reviewed  Meds, vitals, and allergies reviewed.   ROS: See HPI.  Otherwise negative.    Weight : Wt Readings from Last 3 Encounters:  09/20/21 214 lb (97.1 kg)  08/18/21 210 lb (95.3 kg)  08/04/21 210 lb (95.3 kg)   37.02 kg/m  Retired  Really likes it so far  Able to take care of herself   Walks with friends at the park or the [place] regularly  Works on hobbies   Hearing/vision: Hearing Screening  Method: Audiometry   '500Hz'  '1000Hz'  '2000Hz'  '4000Hz'   Right ear '25 25 25 25  ' Left ear '25 25 25 25   ' Vision Screening   Right eye Left eye Both eyes  Without correction     With correction '20/20 20/20 20/20 '   Notices a little hearing loss Not bothersome   PHQ: Depression screen [facility] 2/9 09/20/2021 09/14/2020 05/12/2019 05/12/2019 01/22/2018  Decreased Interest 0 0 0 0 0  Down, Depressed, Hopeless 0 0 0 0 0  PHQ - 2 Score 0 0 0 0 0  Altered sleeping 0 0 - - -  Tired, decreased energy 0 1 - - -  Change in appetite 0 1 - - -  Feeling bad or failure about yourself  0 1 - - -  Trouble concentrating 0 0 - - -  Moving slowly or fidgety/restless  0 0 - - -  Suicidal thoughts 0 0 - - -  PHQ-9 Score 0 3 - - -  Difficult doing work/chores Not difficult at all - - - -   Mood is good   ADLs: no help needed  Functionality: excellent  Care team  Diavian Furgason-pcp Ardis Hughs- [facility]-ortho    HTN bp is stable today  No cp or palpitations or headaches or edema  No side effects to medicines  BP Readings from Last 3 Encounters:  09/20/21 134/62  08/18/21 (!) 118/47  01/10/21 140/63     Pulse Readings from Last 3 Encounters:  09/20/21 61  08/18/21 (!) 54  01/10/21 62   Doing well with  Hctz 25 mg daily    Elevated glucose Lab Results  Component Value Date   HGBA1C 6.0 09/13/2021  Up  from 5.9 Wants to keep working on diet  Cooking more at home   Cholesterol Lab Results  Component Value Date   CHOL 191 09/13/2021   CHOL 185 09/07/2020   CHOL 175 05/05/2019   Lab Results  Component Value Date   HDL 67.60 09/13/2021   HDL 68.50 09/07/2020   HDL 58.90 05/05/2019   Lab Results  Component Value Date   LDLCALC 104 (H) 09/13/2021   Fontanet 99 09/07/2020   LDLCALC 100 (H) 05/05/2019   Lab Results  Component Value Date   TRIG 95.0 09/13/2021   TRIG 88.0 09/07/2020   TRIG 83.0 05/05/2019   Lab Results  Component Value Date   CHOLHDL 3 09/13/2021   CHOLHDL 3 09/07/2020   CHOLHDL 3 05/05/2019   No results found for: LDLDIRECT  Too much red meat   Patient Active Problem List   Diagnosis Date Noted   Medicare annual wellness visit, subsequent 09/20/2021   Status post total replacement of left hip 01/09/2021   Primary osteoarthritis of left hip 09/21/2020   Left groin pain 09/14/2020   Pain in joint, ankle and foot, left 08/19/2018   Colon polyp 01/22/2018   Estrogen deficiency 10/09/2016   Elevated glucose level 10/09/2016   Obesity 12/24/2014   Hypertension 10/05/2014   Breast cancer screening, high risk patient 10/18/2011   Routine general medical examination at a health care facility 07/02/2011   Encounter for routine gynecological examination 07/02/2011   Irregular heart beat 04/09/2011   HYPOKALEMIA 04/22/2009   Vitamin D deficiency 02/24/2009   STRESS REACTION, ACUTE, WITH EMOTIONAL DISTURBANCE 11/25/2007   Osteopenia 01/14/2007   EDEMA 01/14/2007   Past Medical History:  Diagnosis Date   Anemia    Arthritis    Bradycardia 03/2011   mild - at one time with PACs/ asymptomatic    Breast cancer screening, high risk patient 10/18/2011   Cataracts, bilateral    Colon polyp 12/2007   Disorder of bone and cartilage, unspecified    Edema    Family history of malignant neoplasm of breast    evista for BRCA ppx   Fibrocystic breast    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 and of unspecified site    Hypertension    Hypopotassemia    Neoplasm of uncertain behavior of skin    Osteopenia    Osteoporosis    Other screening mammogram    Pneumonia    Predominant disturbance of emotions    Routine gynecological examination    Seasonal allergies    Unspecified vitamin D deficiency    Past Surgical History:  Procedure Laterality Date   BREAST BIOPSY  01/20/2010  BREAST BIOPSY  02/20/2010   Fibrocystic change   Breast MRI  01/20/2010   Normal   cataracts  03/24/2011   bilateral   COLONOSCOPY     COLONOSCOPY W/ POLYPECTOMY  2019   DEXA  12/22/2003   osteopenia   DEXA  05/23/2006   stable   DEXA  07/23/2008   Osteopenia-slightly improved   EYE SURGERY Bilateral    cataracts removed   LEEP     HPV   POLYPECTOMY     procedure for "bad pap"     TONSILLECTOMY     TOTAL HIP ARTHROPLASTY Left 01/09/2021   Procedure: LEFT TOTAL HIP ARTHROPLASTY ANTERIOR APPROACH;  Surgeon: Leandrew Koyanagi, MD;  Location: [facility];  Service: Orthopedics;  Laterality: Left;  C-3   Social History   Tobacco Use   Smoking status: Former    Types: Cigarettes    Quit date: 07/23/1985    Years since quitting: 36.1   Smokeless tobacco: Never   Tobacco comments:    Quit "years ago"  Vaping Use   Vaping Use: Never used  Substance Use Topics   Alcohol use: Yes    Alcohol/week: 0.0 standard drinks    Comment: Occasional   Drug use: No   Family History  Problem Relation Age of Onset   Breast cancer Mother    Osteoporosis Mother    Diabetes Father    Skin cancer Father        ?   Osteopenia Sister    Breast cancer Sister    Heart attack Paternal Grandfather    Breast cancer Other        cousin/Grandmother   Colon cancer Neg Hx    Esophageal cancer Neg Hx    Rectal cancer Neg Hx    Stomach cancer Neg Hx    Colon polyps Neg Hx    Allergies  Allergen Reactions   Alendronate Sodium Other (See Comments)    REACTION:  leg and joint pain   Current Outpatient Medications on File Prior to Visit  Medication Sig Dispense Refill   acetaminophen (TYLENOL) 500 MG tablet Take 500 mg by mouth as needed.     amoxicillin (AMOXIL) 500 MG capsule TAKE 4 CAPSULES 30 MINUTES PRIOR TO DENTAL PROCEDURE/colonoscopy 8 capsule 2   Calcium 1200-1000 MG-UNIT CHEW Chew 2 tablets by mouth daily.     Cholecalciferol (VITAMIN D) 50 MCG (2000 UT) CAPS Take 2,000 Units by mouth daily.      No current facility-administered medications on file prior to visit.     Review of Systems  Constitutional:  Positive for fatigue. Negative for activity change, appetite change, fever and unexpected weight change.  HENT:  Negative for congestion, ear pain, rhinorrhea, sinus pressure and sore throat.   Eyes:  Negative for pain, redness and visual disturbance.  Respiratory:  Negative for cough, shortness of breath and wheezing.   Cardiovascular:  Negative for chest pain and palpitations.  Gastrointestinal:  Negative for abdominal pain, blood in stool, constipation and diarrhea.  Endocrine: Negative for polydipsia and polyuria.  Genitourinary:  Negative for dysuria, frequency and urgency.  Musculoskeletal:  Positive for arthralgias. Negative for back pain and myalgias.  Skin:  Negative for pallor and rash.  Allergic/Immunologic: Negative for environmental allergies.  Neurological:  Negative for dizziness, syncope and headaches.  Hematological:  Negative for adenopathy. Does not bruise/bleed easily.  Psychiatric/Behavioral:  Negative for decreased concentration and dysphoric mood. The patient is not nervous/anxious.       Objective:  Physical Exam Constitutional:      General: She is not in acute distress.    Appearance: Normal appearance. She is well-developed. She is obese. She is not ill-appearing or diaphoretic.  HENT:     Head: Normocephalic and atraumatic.     Right Ear: Tympanic membrane, ear canal and external ear normal.     Left  Ear: Tympanic membrane, ear canal and external ear normal.     Nose: Nose normal. No congestion.     Mouth/Throat:     Mouth: Mucous membranes are moist.     Pharynx: Oropharynx is clear. No posterior oropharyngeal erythema.  Eyes:     General: No scleral icterus.    Extraocular Movements: Extraocular movements intact.     Conjunctiva/sclera: Conjunctivae normal.     Pupils: Pupils are equal, round, and reactive to light.  Neck:     Thyroid: No thyromegaly.     Vascular: No carotid bruit or JVD.  Cardiovascular:     Rate and Rhythm: Normal rate and regular rhythm.     Pulses: Normal pulses.     Heart sounds: Normal heart sounds.    No gallop.  Pulmonary:     Effort: Pulmonary effort is normal. No respiratory distress.     Breath sounds: Normal breath sounds. No wheezing.     Comments: Good air exch Chest:     Chest wall: No tenderness.  Abdominal:     General: Bowel sounds are normal. There is no distension or abdominal bruit.     Palpations: Abdomen is soft. There is no mass.     Tenderness: There is no abdominal tenderness.     Hernia: No hernia is present.  Genitourinary:    Comments: Breast exam: No mass, nodules, thickening, tenderness, bulging, retraction, inflamation, nipple discharge or skin changes noted.  No axillary or clavicular LA.     Musculoskeletal:        General: No tenderness. Normal range of motion.     Cervical back: Normal range of motion and neck supple. No rigidity. No muscular tenderness.     Right lower leg: No edema.     Left lower leg: No edema.     Comments: Mild kyphosis   Lymphadenopathy:     Cervical: No cervical adenopathy.  Skin:    General: Skin is warm and dry.     Coloration: Skin is not pale.     Findings: No erythema or rash.     Comments: Solar lentigines diffusely Solar aging  Dry skin  Sks   Neurological:     Mental Status: She is alert. Mental status is at baseline.     Cranial Nerves: No cranial nerve deficit.     Motor: No  abnormal muscle tone.     Coordination: Coordination normal.     Gait: Gait normal.     Deep Tendon Reflexes: Reflexes are normal and symmetric. Reflexes normal.  Psychiatric:        Mood and Affect: Mood normal.        Cognition and Memory: Cognition and memory normal.          Assessment & Plan:   Problem List Items Addressed This Visit       Cardiovascular and Mediastinum   Hypertension    bp in fair control at this time  BP Readings from Last 1 Encounters:  09/20/21 134/62  No changes needed Most recent labs reviewed  Disc lifstyle change with low sodium diet and exercise  Plan  to continue hctz 25 mg daily      Relevant Medications   hydrochlorothiazide (HYDRODIURIL) 25 MG tablet     Digestive   Colon polyp    Colonoscopy 07/2021 with 5 y recall         Musculoskeletal and Integument   Osteopenia    dexa 2020 and now due  Order done for pt to call and schedule  One fall no fx Disc fall prevention  Taking ca and D  Enc walking for exercise          Other   Breast cancer screening, high risk patient    MRI is an option-pt plans to check on coverage  For now just thinking about it  Nl mammogram and exam Enc self exams       Elevated glucose level    Lab Results  Component Value Date   HGBA1C 6.0 09/13/2021  disc imp of low glycemic diet and wt loss to prevent DM2       Estrogen deficiency   Relevant Orders   DG Bone Density   Medicare annual wellness visit, subsequent - Primary    Reviewed health habits including diet and exercise and skin cancer prevention Reviewed appropriate screening tests for age  Also reviewed health mt list, fam hx and immunization status , as well as social and family history   See HPI Labs reviewed  Colonoscopy utd Mammogram utd, considering mri Plans to check on coverage of shingrix dexa ordered  Fall pre discussed  Given materials to work on advance directive No cognitive concerns Reviewed hearing and vision  screen /pt is not bothered by hearing  PHQ score is 0  No help needed with ADLs Good functionality       Obesity    Discussed how this problem influences overall health and the risks it imposes  Reviewed plan for weight loss with lower calorie diet (via better food choices and also portion control or program like weight watchers) and exercise building up to or more than 30 minutes 5 days per week including some aerobic activity         Routine general medical examination at a health care facility    Reviewed health habits including diet and exercise and skin cancer prevention Reviewed appropriate screening tests for age  Also reviewed health mt list, fam hx and immunization status , as well as social and family history   See HPI Labs reviewed  Colonoscopy utd Mammogram utd, considering mri Plans to check on coverage of shingrix dexa ordered  Fall pre discussed  Given materials to work on advance directive No cognitive concerns Reviewed hearing and vision screen /pt is not bothered by hearing  PHQ score is 0  No help needed with ADLs Good functionality      Vitamin D deficiency    D level is 51.8  Vitamin D level is therapeutic with current supplementation Disc importance of this to bone and overall health

## 2021-09-20 NOTE — Assessment & Plan Note (Signed)
Reviewed health habits including diet and exercise and skin cancer prevention ?Reviewed appropriate screening tests for age  ?Also reviewed health mt list, fam hx and immunization status , as well as social and family history   ?See HPI ?Labs reviewed  ?Colonoscopy utd ?Mammogram utd, considering mri ?Plans to check on coverage of shingrix ?dexa ordered  ?Fall pre discussed  ?Given materials to work on advance directive ?No cognitive concerns ?Reviewed hearing and vision screen /pt is not bothered by hearing  ?PHQ score is 0  ?No help needed with ADLs ?Good functionality ? ?

## 2021-09-20 NOTE — Patient Instructions (Addendum)
Get the bivalent covid booster when you can ? ?If you are interested in the new shingles vaccine (Shingrix) - call your local pharmacy to check on coverage and availability  ?If affordable, get on a wait list at your pharmacy to get the vaccine. ? ?Please work on your advance directive and have it notarized  ? ?To prevent diabetes ?Try to get most of your carbohydrates from produce (with the exception of white potatoes)  ?Eat less bread/pasta/rice/snack foods/cereals/sweets and other items from the middle of the grocery store (processed carbs) ? ?For cholesterol ?Avoid red meat/ fried foods/ egg yolks/ fatty breakfast meats/ butter, cheese and high fat dairy/ and shellfish   ? ? ?Call to schedule your dexa when you can ? ?Please call the location of your choice from the menu below to schedule your Mammogram and/or Bone Density appointment.   ? ?[place]  ? ?[facility]                ?      Phone:  [phone] ?[address]                               ?[place], [place] [address]                                                             ?Services: Traditional and 3D Mammogram, Bone Density  ? ?[facility]           ?      Phone: [phone] ?[address]                                                       ?[place], [place] [address]    ?Service: Bone Density ONLY  ? *this site does NOT perform mammograms ? ?[facility]                       ? Phone:  [phone] ?[address]                                  ?[place], [place] [address]                                            ?Services:  3D Mammogram and Bone Density  ? ? ?[place] ? ?[facility] [facility]   ?Phone:  [phone]   ?[place]  [place], [place] [address]                                            ?Services: 3D Mammogram and Bone  Density ? ?[place] [facility])  ?Phone:  [phone]   ?[address]                        ?[place],  [address]                                              ?Services:  3D Mammogram and Bone Density ? ?If you want to get an MRI for extra breast cancer screening check with your insurance about coverage and let us know  ? ? ?

## 2021-09-20 NOTE — Assessment & Plan Note (Signed)
Discussed how this problem influences overall health and the risks it imposes  Reviewed plan for weight loss with lower calorie diet (via better food choices and also portion control or program like weight watchers) and exercise building up to or more than 30 minutes 5 days per week including some aerobic activity    

## 2021-09-20 NOTE — Assessment & Plan Note (Signed)
Lab Results  ?Component Value Date  ? HGBA1C 6.0 09/13/2021  ? ?disc imp of low glycemic diet and wt loss to prevent DM2  ?

## 2021-09-20 NOTE — Assessment & Plan Note (Signed)
dexa 2020 and now due  ?Order done for pt to call and schedule  ?One fall no fx ?Disc fall prevention  ?Taking ca and D  ?Enc walking for exercise  ? ?

## 2021-09-20 NOTE — Assessment & Plan Note (Signed)
Colonoscopy 07/2021- with 5 y recall 

## 2022-01-03 ENCOUNTER — Ambulatory Visit (INDEPENDENT_AMBULATORY_CARE_PROVIDER_SITE_OTHER): Payer: Medicare Other

## 2022-01-03 ENCOUNTER — Ambulatory Visit (INDEPENDENT_AMBULATORY_CARE_PROVIDER_SITE_OTHER): Payer: Medicare Other | Admitting: Physician Assistant

## 2022-01-03 DIAGNOSIS — Z96642 Presence of left artificial hip joint: Secondary | ICD-10-CM | POA: Diagnosis not present

## 2022-01-03 NOTE — Progress Notes (Signed)
 Post-Op Visit Note   Patient: Denise Ali           Date of Birth: 07/31/1950           MRN: [id number] Visit Date: 01/03/2022 PCP: Tower, Marne A, MD   Assessment & Plan:  Chief Complaint: No chief complaint on file.  Visit Diagnoses:  1. Status post total replacement of left hip   2. History of total hip replacement, left     Plan: Patient is a pleasant 70-year-old female who comes in today 1 year status post left total hip replacement 01/09/2021.  She has been doing great.  No pain and no complaints.  Examination left hip reveals painless logroll and full hip flexion.  She is neurovascular intact distally.  At this point, she may continue to advance with activity as tolerated.  Dental prophylaxis reinforced.  Follow-up in 1 year for repeat evaluation AP pelvis x-rays.  Call with concerns or questions.  Follow-Up Instructions: Return in about 1 year (around 01/04/2023).   Orders:  Orders Placed This Encounter  Procedures   XR Pelvis 1-2 Views   No orders of the defined types were placed in this encounter.   Imaging: No results found.  PMFS History: Patient Active Problem List   Diagnosis Date Noted   Medicare annual wellness visit, subsequent 09/20/2021   Status post total replacement of left hip 01/09/2021   Primary osteoarthritis of left hip 09/21/2020   Left groin pain 09/14/2020   Pain in joint, ankle and foot, left 08/19/2018   Colon polyp 01/22/2018   Estrogen deficiency 10/09/2016   Elevated glucose level 10/09/2016   Obesity 12/24/2014   Hypertension 10/05/2014   Breast cancer screening, high risk patient 10/18/2011   Routine general medical examination at a health care facility 07/02/2011   Encounter for routine gynecological examination 07/02/2011   Irregular heart beat 04/09/2011   HYPOKALEMIA 04/22/2009   Vitamin D deficiency 02/24/2009   STRESS REACTION, ACUTE, WITH EMOTIONAL DISTURBANCE 11/25/2007   Osteopenia 01/14/2007   EDEMA  01/14/2007   Past Medical History:  Diagnosis Date   Anemia    Arthritis    Bradycardia 03/2011   mild - at one time with PACs/ asymptomatic    Breast cancer screening, high risk patient 10/18/2011   Cataracts, bilateral    Colon polyp 12/2007   Disorder of bone and cartilage, unspecified    Edema    Family history of malignant neoplasm of breast    evista for BRCA ppx   Fibrocystic breast    Human papillomavirus in conditions classified elsewhere and of unspecified site    Hypertension    Hypopotassemia    Neoplasm of uncertain behavior of skin    Osteopenia    Osteoporosis    Other screening mammogram    Pneumonia    Predominant disturbance of emotions    Routine gynecological examination    Seasonal allergies    Unspecified vitamin D deficiency     Family History  Problem Relation Age of Onset   Breast cancer Mother    Osteoporosis Mother    Diabetes Father    Skin cancer Father        ?   Osteopenia Sister    Breast cancer Sister    Heart attack Paternal Grandfather    Breast cancer Other        cousin/Grandmother   Colon cancer Neg Hx    Esophageal cancer Neg Hx    Rectal cancer Neg Hx      Stomach cancer Neg Hx    Colon polyps Neg Hx     Past Surgical History:  Procedure Laterality Date   BREAST BIOPSY  01/20/2010   BREAST BIOPSY  02/20/2010   Fibrocystic change   Breast MRI  01/20/2010   Normal   cataracts  03/24/2011   bilateral   COLONOSCOPY     COLONOSCOPY W/ POLYPECTOMY  2019   DEXA  12/22/2003   osteopenia   DEXA  05/23/2006   stable   DEXA  07/23/2008   Osteopenia-slightly improved   EYE SURGERY Bilateral    cataracts removed   LEEP     HPV   POLYPECTOMY     procedure for "bad pap"     TONSILLECTOMY     TOTAL HIP ARTHROPLASTY Left 01/09/2021   Procedure: LEFT TOTAL HIP ARTHROPLASTY ANTERIOR APPROACH;  Surgeon: Leandrew Koyanagi, MD;  Location: [facility];  Service: Orthopedics;  Laterality: Left;  C-3   Social History   Occupational  History   Occupation: Social Services-works at [employer]  Tobacco Use   Smoking status: Former    Types: Cigarettes    Quit date: 07/23/1985    Years since quitting: 36.4   Smokeless tobacco: Never   Tobacco comments:    Quit "years ago"  Vaping Use   Vaping Use: Never used  Substance and Sexual Activity   Alcohol use: Yes    Alcohol/week: 0.0 standard drinks of alcohol    Comment: Occasional   Drug use: No   Sexual activity: Not Currently

## 2022-02-08 ENCOUNTER — Other Ambulatory Visit: Payer: Self-pay

## 2022-02-08 ENCOUNTER — Encounter: Payer: Self-pay | Admitting: Orthopaedic Surgery

## 2022-02-08 MED ORDER — AMOXICILLIN 500 MG PO CAPS: ORAL_CAPSULE | ORAL | 2 refills | Status: DC

## 2022-07-19 LAB — HM MAMMOGRAPHY

## 2022-08-09 ENCOUNTER — Encounter: Payer: Self-pay | Admitting: Family Medicine

## 2022-09-18 ENCOUNTER — Other Ambulatory Visit: Payer: Self-pay | Admitting: Family Medicine

## 2022-09-18 ENCOUNTER — Telehealth: Payer: Self-pay | Admitting: Family Medicine

## 2022-09-18 NOTE — Telephone Encounter (Signed)
Contacted [name] to schedule their annual wellness visit. Appointment made for 10/23/2022.  [facility] Direct Dial: [phone]

## 2022-10-18 ENCOUNTER — Telehealth: Payer: Self-pay | Admitting: Family Medicine

## 2022-10-18 DIAGNOSIS — R7309 Other abnormal glucose: Secondary | ICD-10-CM

## 2022-10-18 DIAGNOSIS — E559 Vitamin D deficiency, unspecified: Secondary | ICD-10-CM

## 2022-10-18 DIAGNOSIS — I10 Essential (primary) hypertension: Secondary | ICD-10-CM

## 2022-10-18 NOTE — Telephone Encounter (Signed)
-----   Message from Ellamae Sia sent at 10/09/2022 10:42 AM EDT ----- Regarding: Lab orders for Monday, 4.1.24 Patient is scheduled for CPX labs, please order future labs, Thanks , Karna Christmas

## 2022-10-22 ENCOUNTER — Other Ambulatory Visit (INDEPENDENT_AMBULATORY_CARE_PROVIDER_SITE_OTHER): Payer: Medicare Other

## 2022-10-22 DIAGNOSIS — E559 Vitamin D deficiency, unspecified: Secondary | ICD-10-CM | POA: Diagnosis not present

## 2022-10-22 DIAGNOSIS — I10 Essential (primary) hypertension: Secondary | ICD-10-CM | POA: Diagnosis not present

## 2022-10-22 DIAGNOSIS — R7309 Other abnormal glucose: Secondary | ICD-10-CM

## 2022-10-22 LAB — LIPID PANEL
Cholesterol: 186 mg/dL (ref 0–200)
HDL: 70 mg/dL (ref >39.00)
LDL Cholesterol: 105 mg/dL — ABNORMAL HIGH (ref 0–99)
NonHDL: 116.43
Total CHOL/HDL Ratio: 3
Triglycerides: 57 mg/dL (ref 0.0–149.0)
VLDL: 11.4 mg/dL (ref 0.0–40.0)

## 2022-10-22 LAB — COMPREHENSIVE METABOLIC PANEL
ALT: 11 U/L (ref 0–35)
AST: 17 U/L (ref 0–37)
Albumin: 3.9 g/dL (ref 3.5–5.2)
Alkaline Phosphatase: 57 U/L (ref 39–117)
BUN: 20 mg/dL (ref 6–23)
CO2: 28 mEq/L (ref 19–32)
Calcium: 10 mg/dL (ref 8.4–10.5)
Chloride: 104 mEq/L (ref 96–112)
Creatinine, Ser: 0.7 mg/dL (ref 0.40–1.20)
GFR: 87.12 mL/min (ref >60.00)
Glucose, Bld: 99 mg/dL (ref 70–99)
Potassium: 3.6 mEq/L (ref 3.5–5.1)
Sodium: 137 mEq/L (ref 135–145)
Total Bilirubin: 0.8 mg/dL (ref 0.2–1.2)
Total Protein: 6.8 g/dL (ref 6.0–8.3)

## 2022-10-22 LAB — CBC WITH DIFFERENTIAL/PLATELET
Basophils Absolute: 0.1 10*3/uL (ref 0.0–0.1)
Basophils Relative: 1.3 % (ref 0.0–3.0)
Eosinophils Absolute: 0.1 10*3/uL (ref 0.0–0.7)
Eosinophils Relative: 2.4 % (ref 0.0–5.0)
HCT: 38.9 % (ref 36.0–46.0)
Hemoglobin: 13.2 g/dL (ref 12.0–15.0)
Lymphocytes Relative: 29.7 % (ref 12.0–46.0)
Lymphs Abs: 1.7 10*3/uL (ref 0.7–4.0)
MCHC: 33.9 g/dL (ref 30.0–36.0)
MCV: 91.6 fl (ref 78.0–100.0)
Monocytes Absolute: 0.5 10*3/uL (ref 0.1–1.0)
Monocytes Relative: 9.4 % (ref 3.0–12.0)
Neutro Abs: 3.3 10*3/uL (ref 1.4–7.7)
Neutrophils Relative %: 57.2 % (ref 43.0–77.0)
Platelets: 234 10*3/uL (ref 150.0–400.0)
RBC: 4.25 Mil/uL (ref 3.87–5.11)
RDW: 13.4 % (ref 11.5–15.5)
WBC: 5.8 10*3/uL (ref 4.0–10.5)

## 2022-10-22 LAB — HEMOGLOBIN A1C: Hgb A1c MFr Bld: 6 % (ref 4.6–6.5)

## 2022-10-22 LAB — TSH: TSH: 2.66 u[IU]/mL (ref 0.35–5.50)

## 2022-10-22 LAB — VITAMIN D 25 HYDROXY (VIT D DEFICIENCY, FRACTURES): VITD: 40.15 ng/mL (ref 30.00–100.00)

## 2022-10-23 ENCOUNTER — Ambulatory Visit (INDEPENDENT_AMBULATORY_CARE_PROVIDER_SITE_OTHER): Payer: Medicare Other

## 2022-10-23 VITALS — Ht 63.5 in | Wt 215.0 lb

## 2022-10-23 DIAGNOSIS — Z0000 Encounter for general adult medical examination without abnormal findings: Secondary | ICD-10-CM

## 2022-10-23 NOTE — Progress Notes (Signed)
I connected with  [name] on 10/23/22 by a audio enabled telemedicine application and verified that I am speaking with the correct person using two identifiers.  Patient Location: Home  Provider Location: Home Office  I discussed the limitations of evaluation and management by telemedicine. The patient expressed understanding and agreed to proceed.  Subjective:   Denise Ali is a 72 y.o. female who presents for Medicare Annual (Subsequent) preventive examination.  Review of Systems      Cardiac Risk Factors include: advanced age (>88men, >51 women)     Objective:    Today's Vitals   10/23/22 1003  Weight: 215 lb (97.5 kg)  Height: 5' 3.5" (1.613 m)   Body mass index is 37.49 kg/m.     10/23/2022   10:15 AM 01/09/2021    5:20 PM 01/05/2021   10:08 AM 11/18/2014    2:51 PM  Advanced Directives  Does Patient Have a Medical Advance Directive? Yes No No No  Type of Paramedic of [place];Living will     Does patient want to make changes to medical advance directive?  No - Patient declined Yes (MAU/Ambulatory/Procedural Areas - Information given)   Copy of [place] in Chart? No - copy requested     Would patient like information on creating a medical advance directive?  No - Patient declined      Current Medications (verified) Outpatient Encounter Medications as of 10/23/2022  Medication Sig   acetaminophen (TYLENOL) 500 MG tablet Take 500 mg by mouth as needed.   Calcium 1200-1000 MG-UNIT CHEW Chew 2 tablets by mouth daily.   Cholecalciferol (VITAMIN D) 50 MCG (2000 UT) CAPS Take 2,000 Units by mouth daily.    hydrochlorothiazide (HYDRODIURIL) 25 MG tablet TAKE 1 TABLET (25 MG TOTAL) BY MOUTH DAILY.   amoxicillin (AMOXIL) 500 MG capsule TAKE 4 CAPSULES 30 MINUTES PRIOR TO DENTAL PROCEDURE/colonoscopy (Patient not taking: Reported on 10/23/2022)   No facility-administered encounter medications on file as of 10/23/2022.     Allergies (verified) Alendronate sodium   History: Past Medical History:  Diagnosis Date   Anemia    Arthritis    Bradycardia 03/2011   mild - at one time with PACs/ asymptomatic    Breast cancer screening, high risk patient 10/18/2011   Cataracts, bilateral    Colon polyp 12/2007   Disorder of bone and cartilage, unspecified    Edema    Family history of malignant neoplasm of breast    evista for BRCA ppx   Fibrocystic breast    Human papillomavirus in conditions classified elsewhere and of unspecified site    Hypertension    Hypopotassemia    Neoplasm of uncertain behavior of skin    Osteopenia    Osteoporosis    Other screening mammogram    Pneumonia    Predominant disturbance of emotions    Routine gynecological examination    Seasonal allergies    Unspecified vitamin D deficiency    Past Surgical History:  Procedure Laterality Date   BREAST BIOPSY  01/20/2010   BREAST BIOPSY  02/20/2010   Fibrocystic change   Breast MRI  01/20/2010   Normal   cataracts  03/24/2011   bilateral   COLONOSCOPY     COLONOSCOPY W/ POLYPECTOMY  2019   DEXA  12/22/2003   osteopenia   DEXA  05/23/2006   stable   DEXA  07/23/2008   Osteopenia-slightly improved   EYE SURGERY Bilateral    cataracts  removed   LEEP     HPV   POLYPECTOMY     procedure for "bad pap"     SKIN BIOPSY Left 2023   hand   TONSILLECTOMY     TOTAL HIP ARTHROPLASTY Left 01/09/2021   Procedure: LEFT TOTAL HIP ARTHROPLASTY ANTERIOR APPROACH;  Surgeon: Leandrew Koyanagi, MD;  Location: [facility];  Service: Orthopedics;  Laterality: Left;  C-3   Family History  Problem Relation Age of Onset   Breast cancer Mother    Osteoporosis Mother    Diabetes Father    Skin cancer Father        ?   Osteopenia Sister    Breast cancer Sister    Heart attack Paternal Grandfather    Breast cancer Other        cousin/Grandmother   Colon cancer Neg Hx    Esophageal cancer Neg Hx    Rectal cancer Neg Hx    Stomach  cancer Neg Hx    Colon polyps Neg Hx    Social History   Socioeconomic History   Marital status: Divorced    Spouse name: Not on file   Number of children: 1   Years of education: Not on file   Highest education level: Not on file  Occupational History   Occupation: Social Services-works at [employer]  Tobacco Use   Smoking status: Former    Types: Cigarettes    Quit date: 07/23/1985    Years since quitting: 37.2   Smokeless tobacco: Never   Tobacco comments:    Quit "years ago"  Vaping Use   Vaping Use: Never used  Substance and Sexual Activity   Alcohol use: Yes    Alcohol/week: 0.0 standard drinks of alcohol    Comment: Occasional   Drug use: No   Sexual activity: Not Currently  Other Topics Concern   Not on file  Social History Narrative   Works very long hours      Quit smoking year ago            Social Determinants of Radio broadcast assistant Strain: Low Risk  (10/23/2022)   Overall Financial Resource Strain (CARDIA)    Difficulty of Paying Living Expenses: Not hard at all  Food Insecurity: No Food Insecurity (10/23/2022)   Hunger Vital Sign    Worried About Running Out of Food in the Last Year: Never true    [place] in the Last Year: Never true  Transportation Needs: No Transportation Needs (10/23/2022)   PRAPARE - Hydrologist (Medical): No    Lack of Transportation (Non-Medical): No  Physical Activity: Insufficiently Active (10/23/2022)   Exercise Vital Sign    Days of Exercise per Week: 3 days    Minutes of Exercise per Session: 30 min  Stress: No Stress Concern Present (10/23/2022)   [place]    Feeling of Stress : Not at all  Social Connections: Socially Isolated (10/23/2022)   Social Connection and Isolation Panel [NHANES]    Frequency of Communication with Friends and Family: More than three times a week    Frequency of Social Gatherings with Friends  and Family: Three times a week    Attends Religious Services: Never    Active Member of Clubs or Organizations: No    Attends Archivist Meetings: Never    Marital Status: Divorced    Tobacco Counseling Counseling given: Not Answered  Tobacco comments: Quit "years ago"   Clinical Intake:  Pre-visit preparation completed: Yes  Pain : No/denies pain     Nutritional Risks: None Diabetes: No  How often do you need to have someone help you when you read instructions, pamphlets, or other written materials from your doctor or pharmacy?: 1 - Never  Diabetic?  no  Interpreter Needed?: No  Information entered by :: C.Jakaya Jacobowitz LPN   Activities of Daily Living    10/23/2022   10:16 AM  In your present state of health, do you have any difficulty performing the following activities:  Hearing? 0  Vision? 0  Difficulty concentrating or making decisions? 0  Walking or climbing stairs? 0  Dressing or bathing? 0  Doing errands, shopping? 0  Preparing Food and eating ? N  Using the Toilet? N  In the past six months, have you accidently leaked urine? N  Do you have problems with loss of bowel control? N  Managing your Medications? N  Managing your Finances? N  Housekeeping or managing your Housekeeping? N    Patient Care Team: Tower, Wynelle Fanny, MD as PCP - General  Indicate any recent Medical Services you may have received from other than [facility] providers in the past year (date may be approximate).     Assessment:   This is a routine wellness examination for [facility].  Hearing/Vision screen Hearing Screening - Comments:: No aids Vision Screening - Comments:: Glasses for driving - [facility]  Dietary issues and exercise activities discussed: Current Exercise Habits: Structured exercise class, Type of exercise: strength training/weights;Other - see comments (bike), Time (Minutes): 30, Frequency (Times/Week): 4, Weekly Exercise (Minutes/Week): 120, Intensity:  Mild, Exercise limited by: None identified   Goals Addressed             This Visit's Progress    Patient Stated       Exercise more and lose weight.       Depression Screen    10/23/2022   10:15 AM 09/20/2021   10:29 AM 09/14/2020   11:02 AM 05/12/2019    3:29 PM 05/12/2019    3:28 PM 01/22/2018    2:58 PM 10/01/2012    3:05 PM  PHQ 2/9 Scores  PHQ - 2 Score 0 0 0 0 0 0 0  PHQ- 9 Score  0 3        Fall Risk    10/23/2022   10:16 AM 09/20/2021   10:29 AM 12/23/2020    3:07 PM 05/12/2019    3:29 PM 01/22/2018    2:58 PM  [place] in the past year? 0 0 1 1 No  Number falls in past yr: 0 0 1 0   Injury with Fall? 0 0 0 0   Risk for fall due to : No Fall Risks      Follow up Falls prevention discussed;Falls evaluation completed   Falls evaluation completed     FALL RISK PREVENTION PERTAINING TO THE HOME:  Any stairs in or around the home? No  If so, are there any without handrails? No  Home free of loose throw rugs in walkways, pet beds, electrical cords, etc? Yes  Adequate lighting in your home to reduce risk of falls? Yes   ASSISTIVE DEVICES UTILIZED TO PREVENT FALLS:  Life alert? No  Use of a cane, walker or w/c? No  Grab bars in the bathroom? No  Shower chair or bench in shower? Yes  Elevated toilet seat or  a handicapped toilet? Yes    Cognitive Function:        10/23/2022   10:18 AM  6CIT Screen  What Year? 0 points  What month? 0 points  What time? 0 points  Count back from 20 0 points  Months in reverse 0 points  Repeat phrase 0 points  Total Score 0 points    Immunizations Immunization History  Administered Date(s) Administered   COVID-19, mRNA, vaccine(Comirnaty)12 years and older 05/30/2022   Fluad Quad(high Dose 65+) 04/04/2022   Influenza Split 07/02/2011   Influenza Whole 06/04/2006, 05/08/2008, 04/14/2010   Influenza, High Dose Seasonal PF 04/28/2019   Influenza, Seasonal, Injecte, Preservative Fre 05/14/2014, 04/27/2015    Influenza,inj,Quad PF,6+ Mos 05/07/2018   Influenza-Unspecified 04/22/2013, 04/26/2016, 05/04/2020, 05/03/2021   PFIZER Comirnaty(Gray Top)Covid-19 Tri-Sucrose Vaccine 12/25/2020   PFIZER(Purple Top)SARS-COV-2 Vaccination 08/29/2019, 09/22/2019, 05/04/2020   Pneumococcal Conjugate-13 10/09/2016   Pneumococcal Polysaccharide-23 01/22/2018   Td 11/09/2003   Tdap 03/31/2013   Zoster, Live 04/27/2015    TDAP status: Up to date  Flu Vaccine status: Up to date  Pneumococcal vaccine status: Up to date  Covid-19 vaccine status: Information provided on how to obtain vaccines.   Qualifies for Shingles Vaccine? Yes   Zostavax completed  unknown   Shingrix Completed?: Yes  Screening Tests Health Maintenance  Topic Date Due   Hepatitis C Screening  Never done   Zoster Vaccines- Shingrix (1 of 2) Never done   INFLUENZA VACCINE  02/21/2023   DTaP/Tdap/Td (3 - Td or Tdap) 04/01/2023   MAMMOGRAM  07/20/2023   Medicare Annual Wellness (AWV)  10/23/2023   COLONOSCOPY (Pts 45-13yrs Insurance coverage will need to be confirmed)  08/18/2026   Pneumonia Vaccine 41+ Years old  Completed   DEXA SCAN  Completed   COVID-19 Vaccine  Completed   HPV VACCINES  Aged Out    Health Maintenance  Health Maintenance Due  Topic Date Due   Hepatitis C Screening  Never done   Zoster Vaccines- Shingrix (1 of 2) Never done    Colorectal cancer screening: Type of screening: Colonoscopy. Completed 08/18/21. Repeat every 5 years  Mammogram status: Completed 07/19/22. Repeat every year  Bone Density status: Completed 05/26/19. Results reflect: Bone density results: OSTEOPENIA. Repeat every 2 years. Will talk with PCP  Lung Cancer Screening: (Low Dose CT Chest recommended if Age 22-80 years, 30 pack-year currently smoking OR have quit w/in 15years.) does not qualify.   Lung Cancer Screening Referral: no  Additional Screening:  Hepatitis C Screening: does qualify; Completed DUE  Vision Screening:  Recommended annual ophthalmology exams for early detection of glaucoma and other disorders of the eye. Is the patient up to date with their annual eye exam?  Yes  Who is the provider or what is the name of the office in which the patient attends annual eye exams? [facility] If pt is not established with a provider, would they like to be referred to a provider to establish care? No .   Dental Screening: Recommended annual dental exams for proper oral hygiene  Community Resource Referral / Chronic Care Management: CRR required this visit?  No   CCM required this visit?  No      Plan:     I have personally reviewed and noted the following in the patient's chart:   Medical and social history Use of alcohol, tobacco or illicit drugs  Current medications and supplements including opioid prescriptions. Patient is not currently taking opioid prescriptions. Functional ability and  status Nutritional status Physical activity Advanced directives List of other physicians Hospitalizations, surgeries, and ER visits in previous 12 months Vitals Screenings to include cognitive, depression, and falls Referrals and appointments  In addition, I have reviewed and discussed with patient certain preventive protocols, quality metrics, and best practice recommendations. A written personalized care plan for preventive services as well as general preventive health recommendations were provided to patient.     Lebron Conners, LPN   [id number]   Nurse Notes: none

## 2022-10-23 NOTE — Patient Instructions (Signed)
Denise Ali , Thank you for taking time to come for your Medicare Wellness Visit. I appreciate your ongoing commitment to your health goals. Please review the following plan we discussed and let me know if I can assist you in the future.   These are the goals we discussed:  Goals      Patient Stated     Exercise more and lose weight.        This is a list of the screening recommended for you and due dates:  Health Maintenance  Topic Date Due   Hepatitis C Screening: USPSTF Recommendation to screen - Ages 33-79 yo.  Never done   Zoster (Shingles) Vaccine (1 of 2) Never done   Flu Shot  02/21/2023   DTaP/Tdap/Td vaccine (3 - Td or Tdap) 04/01/2023   Mammogram  07/20/2023   Medicare Annual Wellness Visit  10/23/2023   Colon Cancer Screening  08/18/2026   Pneumonia Vaccine  Completed   DEXA scan (bone density measurement)  Completed   COVID-19 Vaccine  Completed   HPV Vaccine  Aged Out    Advanced directives: Please bring a copy of your health care power of attorney and living will to the office to be added to your chart at your convenience.   Conditions/risks identified: none  Next appointment: Follow up in one year for your annual wellness visit 10/24/23 @ 10:30 telephone.   Preventive Care 58 Years and Older, Female Preventive care refers to lifestyle choices and visits with your health care provider that can promote health and wellness. What does preventive care include? A yearly physical exam. This is also called an annual well check. Dental exams once or twice a year. Routine eye exams. Ask your health care provider how often you should have your eyes checked. Personal lifestyle choices, including: Daily care of your teeth and gums. Regular physical activity. Eating a healthy diet. Avoiding tobacco and drug use. Limiting alcohol use. Practicing safe sex. Taking low-dose aspirin every day. Taking vitamin and mineral supplements as recommended by your health care  provider. What happens during an annual well check? The services and screenings done by your health care provider during your annual well check will depend on your age, overall health, lifestyle risk factors, and family history of disease. Counseling  Your health care provider may ask you questions about your: Alcohol use. Tobacco use. Drug use. Emotional well-being. Home and relationship well-being. Sexual activity. Eating habits. History of falls. Memory and ability to understand (cognition). Work and work Statistician. Reproductive health. Screening  You may have the following tests or measurements: Height, weight, and BMI. Blood pressure. Lipid and cholesterol levels. These may be checked every 5 years, or more frequently if you are over 34 years old. Skin check. Lung cancer screening. You may have this screening every year starting at age 41 if you have a 30-pack-year history of smoking and currently smoke or have quit within the past 15 years. Fecal occult blood test (FOBT) of the stool. You may have this test every year starting at age 66. Flexible sigmoidoscopy or colonoscopy. You may have a sigmoidoscopy every 5 years or a colonoscopy every 10 years starting at age 37. Hepatitis C blood test. Hepatitis B blood test. Sexually transmitted disease (STD) testing. Diabetes screening. This is done by checking your blood sugar (glucose) after you have not eaten for a while (fasting). You may have this done every 1-3 years. Bone density scan. This is done to screen for osteoporosis. You may  have this done starting at age 36. Mammogram. This may be done every 1-2 years. Talk to your health care provider about how often you should have regular mammograms. Talk with your health care provider about your test results, treatment options, and if necessary, the need for more tests. Vaccines  Your health care provider may recommend certain vaccines, such as: Influenza vaccine. This is  recommended every year. Tetanus, diphtheria, and acellular pertussis (Tdap, Td) vaccine. You may need a Td booster every 10 years. Zoster vaccine. You may need this after age 51. Pneumococcal 13-valent conjugate (PCV13) vaccine. One dose is recommended after age 25. Pneumococcal polysaccharide (PPSV23) vaccine. One dose is recommended after age 50. Talk to your health care provider about which screenings and vaccines you need and how often you need them. This information is not intended to replace advice given to you by your health care provider. Make sure you discuss any questions you have with your health care provider. Document Released: 08/05/2015 Document Revised: 03/28/2016 Document Reviewed: 05/10/2015 Elsevier Interactive Patient Education  2017 [place] Prevention in the Home Falls can cause injuries. They can happen to people of all ages. There are many things you can do to make your home safe and to help prevent falls. What can I do on the outside of my home? Regularly fix the edges of walkways and driveways and fix any cracks. Remove anything that might make you trip as you walk through a door, such as a raised step or threshold. Trim any bushes or trees on the path to your home. Use bright outdoor lighting. Clear any walking paths of anything that might make someone trip, such as rocks or tools. Regularly check to see if handrails are loose or broken. Make sure that both sides of any steps have handrails. Any raised decks and porches should have guardrails on the edges. Have any leaves, snow, or ice cleared regularly. Use sand or salt on walking paths during winter. Clean up any spills in your garage right away. This includes oil or grease spills. What can I do in the bathroom? Use night lights. Install grab bars by the toilet and in the tub and shower. Do not use towel bars as grab bars. Use non-skid mats or decals in the tub or shower. If you need to sit down in  the shower, use a plastic, non-slip stool. Keep the floor dry. Clean up any water that spills on the floor as soon as it happens. Remove soap buildup in the tub or shower regularly. Attach bath mats securely with double-sided non-slip rug tape. Do not have throw rugs and other things on the floor that can make you trip. What can I do in the bedroom? Use night lights. Make sure that you have a light by your bed that is easy to reach. Do not use any sheets or blankets that are too big for your bed. They should not hang down onto the floor. Have a firm chair that has side arms. You can use this for support while you get dressed. Do not have throw rugs and other things on the floor that can make you trip. What can I do in the kitchen? Clean up any spills right away. Avoid walking on wet floors. Keep items that you use a lot in easy-to-reach places. If you need to reach something above you, use a strong step stool that has a grab bar. Keep electrical cords out of the way. Do not use floor polish  or wax that makes floors slippery. If you must use wax, use non-skid floor wax. Do not have throw rugs and other things on the floor that can make you trip. What can I do with my stairs? Do not leave any items on the stairs. Make sure that there are handrails on both sides of the stairs and use them. Fix handrails that are broken or loose. Make sure that handrails are as long as the stairways. Check any carpeting to make sure that it is firmly attached to the stairs. Fix any carpet that is loose or worn. Avoid having throw rugs at the top or bottom of the stairs. If you do have throw rugs, attach them to the floor with carpet tape. Make sure that you have a light switch at the top of the stairs and the bottom of the stairs. If you do not have them, ask someone to add them for you. What else can I do to help prevent falls? Wear shoes that: Do not have high heels. Have rubber bottoms. Are comfortable  and fit you well. Are closed at the toe. Do not wear sandals. If you use a stepladder: Make sure that it is fully opened. Do not climb a closed stepladder. Make sure that both sides of the stepladder are locked into place. Ask someone to hold it for you, if possible. Clearly mark and make sure that you can see: Any grab bars or handrails. First and last steps. Where the edge of each step is. Use tools that help you move around (mobility aids) if they are needed. These include: Canes. Walkers. Scooters. Crutches. Turn on the lights when you go into a dark area. Replace any light bulbs as soon as they burn out. Set up your furniture so you have a clear path. Avoid moving your furniture around. If any of your floors are uneven, fix them. If there are any pets around you, be aware of where they are. Review your medicines with your doctor. Some medicines can make you feel dizzy. This can increase your chance of falling. Ask your doctor what other things that you can do to help prevent falls. This information is not intended to replace advice given to you by your health care provider. Make sure you discuss any questions you have with your health care provider. Document Released: 05/05/2009 Document Revised: 12/15/2015 Document Reviewed: 08/13/2014 Elsevier Interactive Patient Education  2017 [employer].

## 2022-10-25 ENCOUNTER — Encounter: Payer: Self-pay | Admitting: Family Medicine

## 2022-10-25 ENCOUNTER — Ambulatory Visit (INDEPENDENT_AMBULATORY_CARE_PROVIDER_SITE_OTHER): Payer: Medicare Other | Admitting: Family Medicine

## 2022-10-25 VITALS — BP 122/68 | HR 67 | Temp 97.6°F | Ht 63.75 in | Wt 207.5 lb

## 2022-10-25 DIAGNOSIS — I10 Essential (primary) hypertension: Secondary | ICD-10-CM

## 2022-10-25 DIAGNOSIS — Z0000 Encounter for general adult medical examination without abnormal findings: Secondary | ICD-10-CM

## 2022-10-25 DIAGNOSIS — M8588 Other specified disorders of bone density and structure, other site: Secondary | ICD-10-CM | POA: Diagnosis not present

## 2022-10-25 DIAGNOSIS — Z1239 Encounter for other screening for malignant neoplasm of breast: Secondary | ICD-10-CM | POA: Diagnosis not present

## 2022-10-25 DIAGNOSIS — R7303 Prediabetes: Secondary | ICD-10-CM

## 2022-10-25 DIAGNOSIS — E559 Vitamin D deficiency, unspecified: Secondary | ICD-10-CM

## 2022-10-25 DIAGNOSIS — E2839 Other primary ovarian failure: Secondary | ICD-10-CM

## 2022-10-25 MED ORDER — HYDROCHLOROTHIAZIDE 25 MG PO TABS: 25.0000 mg | ORAL_TABLET | Freq: Every day | ORAL | 3 refills | Status: DC

## 2022-10-25 NOTE — Assessment & Plan Note (Signed)
Dexa ordered  Last one 05/2019  Reviewed Alendronate caused bony pain  Evista caused hair loss  Disc strength building exercise On ca and D E level in nl range  No falls or fx

## 2022-10-25 NOTE — Assessment & Plan Note (Signed)
Mammogram utd 06/2022  Pt plans to check on coverage of MRI in future  Fam h/o breast cancer  Took evista in past and it caused hair loss

## 2022-10-25 NOTE — Patient Instructions (Addendum)
Keep walking /biking  Add some strength training to your routine, this is important for bone and brain health and can reduce your risk of falls and help your body use insulin properly and regulate weight  Light weights, exercise bands , and internet videos are a good way to start  Yoga (chair or regular), machines , floor exercises or a gym with machines are also good options     Call insurance co. Let them know you are high risk for breast cancer and radiology recommended MRI for more screening  If it is covered let us know   To prevent diabetes Try to get most of your carbohydrates from produce (with the exception of white potatoes)  Eat less bread/pasta/rice/snack foods/cereals/sweets and other items from the middle of the grocery store (processed carbs)    Call to schedule your bone density test   You have an order for:  []   2D Mammogram  []   3D Mammogram  [x]   Bone Density     Please call for appointment:   []   [facility]  [place] [address]  [phone]  []   [place] [facility])   [address] [place], [place] [address]  [phone]  []   [facility]      [address] [place], [place]         []   [facility]  [place], [place]  [x]  [facility]   [address], [place] [address]  [phone]  []  [facility]  [place] [address] [place], [place] [address] [phone]    Make sure to wear two piece clothing  No lotions powders or deodorants the day of the appointment Make sure to bring picture ID and insurance card.  Bring list of medications you are currently taking including any supplements.   Schedule your screening mammogram through  MyChart!   Select [facility] imaging sites can now be scheduled through [place].  Log into your MyChart account.  Go to 'Visit' (or 'Appointments' if  on mobile App) --> Schedule an  Appointment  Under 'Select a Reason for Visit' choose the Mammogram  Screening option.  Complete the pre-visit questions  and select the time and place that  best fits your schedule

## 2022-10-25 NOTE — Assessment & Plan Note (Signed)
Vitamin D level is therapeutic with current supplementation Disc importance of this to bone and overall health Last vitamin D Lab Results  Component Value Date   VD25OH 40.15 10/22/2022

## 2022-10-25 NOTE — Progress Notes (Signed)
Subjective:    Patient ID: Denise Ali, female    DOB: 09-15-1950, 72 y.o.   MRN: [id number]  HPI Here for health maintenance exam and to review chronic medical problems   Wt Readings from Last 3 Encounters:  10/25/22 207 lb 8 oz (94.1 kg)  10/23/22 215 lb (97.5 kg)  09/20/21 214 lb (97.1 kg)   35.90 kg/m  Vitals:   10/25/22 0951  BP: 122/68  Pulse: 67  Temp: 97.6 F (36.4 C)  SpO2: 96%   Doing well  Nothing new   Had a hip repl 2 years    Immunization History  Administered Date(s) Administered   COVID-19, mRNA, vaccine(Comirnaty)12 years and older 05/30/2022   Fluad Quad(high Dose 65+) 04/04/2022   Influenza Split 07/02/2011   Influenza Whole 06/04/2006, 05/08/2008, 04/14/2010   Influenza, High Dose Seasonal PF 04/28/2019   Influenza, Seasonal, Injecte, Preservative Fre 05/14/2014, 04/27/2015   Influenza,inj,Quad PF,6+ Mos 05/07/2018   Influenza-Unspecified 04/22/2013, 04/26/2016, 05/04/2020, 05/03/2021   PFIZER Comirnaty(Gray Top)Covid-19 Tri-Sucrose Vaccine 12/25/2020   PFIZER(Purple Top)SARS-COV-2 Vaccination 08/29/2019, 09/22/2019, 05/04/2020   Pneumococcal Conjugate-13 10/09/2016   Pneumococcal Polysaccharide-23 01/22/2018   Td 11/09/2003   Tdap 03/31/2013   Zoster, Live 04/27/2015   Health Maintenance Due  Topic Date Due   Hepatitis C Screening  Never done   Mammogram 06/2022- needed a 2nd view  Mother had breast cancer  High risk pt -took evista in the past  Self breast exam: no lumps    Colonoscopy 07/2021  5 y recall   Dexa  05/2019  osteopenia Alendronate- bone pain  Evista -hair loss   Falls- none Fractures-none  Supplements  ca and D Last vitamin D Lab Results  Component Value Date   VD25OH 40.15 10/22/2022    Exercise : walking and recumbent bike Some leg weights     Mood    10/23/2022   10:15 AM 09/20/2021   10:29 AM 09/14/2020   11:02 AM 05/12/2019    3:29 PM 05/12/2019    3:28 PM  Depression screen PHQ 2/9   Decreased Interest 0 0 0 0 0  Down, Depressed, Hopeless 0 0 0 0 0  PHQ - 2 Score 0 0 0 0 0  Altered sleeping  0 0    Tired, decreased energy  0 1    Change in appetite  0 1    Feeling bad or failure about yourself   0 1    Trouble concentrating  0 0    Moving slowly or fidgety/restless  0 0    Suicidal thoughts  0 0    PHQ-9 Score  0 3    Difficult doing work/chores  Not difficult at all       HTN bp is stable today  No cp or palpitations or headaches or edema  No side effects to medicines  BP Readings from Last 3 Encounters:  10/25/22 122/68  09/20/21 134/62  08/18/21 (!) 118/47    Hctz 25 mg daily   Glucose Lab Results  Component Value Date   HGBA1C 6.0 10/22/2022   No change  Diet is so /so   Father was prediabetic    Last metabolic panel Lab Results  Component Value Date   GLUCOSE 99 10/22/2022   NA 137 10/22/2022   K 3.6 10/22/2022   CL 104 10/22/2022   CO2 28 10/22/2022   BUN 20 10/22/2022   CREATININE 0.70 10/22/2022   GFRNONAA >60 01/10/2021   CALCIUM 10.0 10/22/2022  PHOS 3.7 03/31/2009   PROT 6.8 10/22/2022   ALBUMIN 3.9 10/22/2022   BILITOT 0.8 10/22/2022   ALKPHOS 57 10/22/2022   AST 17 10/22/2022   ALT 11 10/22/2022   ANIONGAP 6 01/10/2021   Lab Results  Component Value Date   WBC 5.8 10/22/2022   HGB 13.2 10/22/2022   HCT 38.9 10/22/2022   MCV 91.6 10/22/2022   PLT 234.0 10/22/2022   Lab Results  Component Value Date   TSH 2.66 10/22/2022   Cholesterol Lab Results  Component Value Date   CHOL 186 10/22/2022   CHOL 191 09/13/2021   CHOL 185 09/07/2020   Lab Results  Component Value Date   HDL 70.00 10/22/2022   HDL 67.60 09/13/2021   HDL 68.50 09/07/2020   Lab Results  Component Value Date   LDLCALC 105 (H) 10/22/2022   LDLCALC 104 (H) 09/13/2021   LDLCALC 99 09/07/2020   Lab Results  Component Value Date   TRIG 57.0 10/22/2022   TRIG 95.0 09/13/2021   TRIG 88.0 09/07/2020   Lab Results  Component Value Date    CHOLHDL 3 10/22/2022   CHOLHDL 3 09/13/2021   CHOLHDL 3 09/07/2020   No results found for: "LDLDIRECT"   Patient Active Problem List   Diagnosis Date Noted   Medicare annual wellness visit, subsequent 09/20/2021   Status post total replacement of left hip 01/09/2021   Primary osteoarthritis of left hip 09/21/2020   Left groin pain 09/14/2020   Pain in joint, ankle and foot, left 08/19/2018   Colon polyp 01/22/2018   Estrogen deficiency 10/09/2016   Prediabetes 10/09/2016   Obesity 12/24/2014   Hypertension 10/05/2014   Breast cancer screening, high risk patient 10/18/2011   Routine general medical examination at a health care facility 07/02/2011   Encounter for routine gynecological examination 07/02/2011   Irregular heart beat 04/09/2011   HYPOKALEMIA 04/22/2009   Vitamin D deficiency 02/24/2009   STRESS REACTION, ACUTE, WITH EMOTIONAL DISTURBANCE 11/25/2007   Osteopenia 01/14/2007   EDEMA 01/14/2007   Past Medical History:  Diagnosis Date   Anemia    Arthritis    Bradycardia 03/2011   mild - at one time with PACs/ asymptomatic    Breast cancer screening, high risk patient 10/18/2011   Cataracts, bilateral    Colon polyp 12/2007   Disorder of bone and cartilage, unspecified    Edema    Family history of malignant neoplasm of breast    evista for BRCA ppx   Fibrocystic breast    Human papillomavirus in conditions classified elsewhere and of unspecified site    Hypertension    Hypopotassemia    Neoplasm of uncertain behavior of skin    Osteopenia    Osteoporosis    Other screening mammogram    Pneumonia    Predominant disturbance of emotions    Routine gynecological examination    Seasonal allergies    Unspecified vitamin D deficiency    Past Surgical History:  Procedure Laterality Date   BREAST BIOPSY  01/20/2010   BREAST BIOPSY  02/20/2010   Fibrocystic change   Breast MRI  01/20/2010   Normal   cataracts  03/24/2011   bilateral   COLONOSCOPY      COLONOSCOPY W/ POLYPECTOMY  2019   DEXA  12/22/2003   osteopenia   DEXA  05/23/2006   stable   DEXA  07/23/2008   Osteopenia-slightly improved   EYE SURGERY Bilateral    cataracts removed   LEEP  HPV   POLYPECTOMY     procedure for "bad pap"     SKIN BIOPSY Left 2023   hand   TONSILLECTOMY     TOTAL HIP ARTHROPLASTY Left 01/09/2021   Procedure: LEFT TOTAL HIP ARTHROPLASTY ANTERIOR APPROACH;  Surgeon: Leandrew Koyanagi, MD;  Location: [facility];  Service: Orthopedics;  Laterality: Left;  C-3   Social History   Tobacco Use   Smoking status: Former    Types: Cigarettes    Quit date: 07/23/1985    Years since quitting: 37.2   Smokeless tobacco: Never   Tobacco comments:    Quit "years ago"  Vaping Use   Vaping Use: Never used  Substance Use Topics   Alcohol use: Yes    Alcohol/week: 0.0 standard drinks of alcohol    Comment: Occasional   Drug use: No   Family History  Problem Relation Age of Onset   Breast cancer Mother    Osteoporosis Mother    Diabetes Father    Skin cancer Father        ?   Osteopenia Sister    Breast cancer Sister    Heart attack Paternal Grandfather    Breast cancer Other        cousin/Grandmother   Colon cancer Neg Hx    Esophageal cancer Neg Hx    Rectal cancer Neg Hx    Stomach cancer Neg Hx    Colon polyps Neg Hx    Allergies  Allergen Reactions   Alendronate Sodium Other (See Comments)    REACTION: leg and joint pain   Current Outpatient Medications on File Prior to Visit  Medication Sig Dispense Refill   acetaminophen (TYLENOL) 500 MG tablet Take 500 mg by mouth as needed.     amoxicillin (AMOXIL) 500 MG capsule TAKE 4 CAPSULES 30 MINUTES PRIOR TO DENTAL PROCEDURE/colonoscopy 8 capsule 2   Calcium 1200-1000 MG-UNIT CHEW Chew 2 tablets by mouth daily.     Cholecalciferol (VITAMIN D) 50 MCG (2000 UT) CAPS Take 2,000 Units by mouth daily.      No current facility-administered medications on file prior to visit.     Review of Systems   Constitutional:  Negative for activity change, appetite change, fatigue, fever and unexpected weight change.  HENT:  Negative for congestion, ear pain, rhinorrhea, sinus pressure and sore throat.   Eyes:  Negative for pain, redness and visual disturbance.  Respiratory:  Negative for cough, shortness of breath and wheezing.   Cardiovascular:  Negative for chest pain and palpitations.  Gastrointestinal:  Negative for abdominal pain, blood in stool, constipation and diarrhea.  Endocrine: Negative for polydipsia and polyuria.  Genitourinary:  Negative for dysuria, frequency and urgency.  Musculoskeletal:  Positive for arthralgias. Negative for back pain and myalgias.  Skin:  Negative for pallor and rash.  Allergic/Immunologic: Negative for environmental allergies.  Neurological:  Negative for dizziness, syncope and headaches.  Hematological:  Negative for adenopathy. Does not bruise/bleed easily.  Psychiatric/Behavioral:  Negative for decreased concentration and dysphoric mood. The patient is not nervous/anxious.        Objective:   Physical Exam Constitutional:      General: She is not in acute distress.    Appearance: Normal appearance. She is well-developed. She is obese. She is not ill-appearing or diaphoretic.  HENT:     Head: Normocephalic and atraumatic.     Right Ear: Tympanic membrane, ear canal and external ear normal.     Left Ear: Tympanic membrane, ear canal  and external ear normal.     Nose: Nose normal. No congestion.     Mouth/Throat:     Mouth: Mucous membranes are moist.     Pharynx: Oropharynx is clear. No posterior oropharyngeal erythema.  Eyes:     General: No scleral icterus.    Extraocular Movements: Extraocular movements intact.     Conjunctiva/sclera: Conjunctivae normal.     Pupils: Pupils are equal, round, and reactive to light.  Neck:     Thyroid: No thyromegaly.     Vascular: No carotid bruit or JVD.  Cardiovascular:     Rate and Rhythm: Normal rate  and regular rhythm.     Pulses: Normal pulses.     Heart sounds: Normal heart sounds.     No gallop.  Pulmonary:     Effort: Pulmonary effort is normal. No respiratory distress.     Breath sounds: Normal breath sounds. No wheezing.     Comments: Good air exch Chest:     Chest wall: No tenderness.  Abdominal:     General: Bowel sounds are normal. There is no distension or abdominal bruit.     Palpations: Abdomen is soft. There is no mass.     Tenderness: There is no abdominal tenderness.     Hernia: No hernia is present.  Genitourinary:    Comments: Breast exam: No mass, nodules, thickening, tenderness, bulging, retraction, inflamation, nipple discharge or skin changes noted.  No axillary or clavicular LA.     Musculoskeletal:        General: No tenderness. Normal range of motion.     Cervical back: Normal range of motion and neck supple. No rigidity. No muscular tenderness.     Right lower leg: No edema.     Left lower leg: No edema.     Comments: No kyphosis   Lymphadenopathy:     Cervical: No cervical adenopathy.  Skin:    General: Skin is warm and dry.     Coloration: Skin is not pale.     Findings: No erythema or rash.     Comments: Solar lentigines diffusely   Neurological:     Mental Status: She is alert. Mental status is at baseline.     Cranial Nerves: No cranial nerve deficit.     Motor: No abnormal muscle tone.     Coordination: Coordination normal.     Gait: Gait normal.     Deep Tendon Reflexes: Reflexes are normal and symmetric. Reflexes normal.  Psychiatric:        Mood and Affect: Mood normal.        Cognition and Memory: Cognition and memory normal.           Assessment & Plan:   Problem List Items Addressed This Visit       Cardiovascular and Mediastinum   Hypertension    bp in fair control at this time  BP Readings from Last 1 Encounters:  10/25/22 122/68  No changes needed Most recent labs reviewed  Disc lifstyle change with low sodium  diet and exercise  Plan to continue hctz 25 mg daily      Relevant Medications   hydrochlorothiazide (HYDRODIURIL) 25 MG tablet     Musculoskeletal and Integument   Osteopenia    Dexa ordered  Last one 05/2019  Reviewed Alendronate caused bony pain  Evista caused hair loss  Disc strength building exercise On ca and D E level in nl range  No falls or fx  Other   Breast cancer screening, high risk patient    Mammogram utd 06/2022  Pt plans to check on coverage of MRI in future  Fam h/o breast cancer  Took evista in past and it caused hair loss      Estrogen deficiency    Dexa ordered      Relevant Orders   DG Bone Density   Prediabetes    Lab Results  Component Value Date   HGBA1C 6.0 10/22/2022  disc imp of low glycemic diet and wt loss to prevent DM2       Routine general medical examination at a health care facility - Primary    Reviewed health habits including diet and exercise and skin cancer prevention Reviewed appropriate screening tests for age  Also reviewed health mt list, fam hx and immunization status , as well as social and family history   See HPI Labs reviewed and ordered Mammogram utd 06/2022 Colonoscopy 07/2021 with 5 y recall Dexa ordered/no falls or fx  Disc bone health and exercise  PHQ 0        Vitamin D deficiency    Vitamin D level is therapeutic with current supplementation Disc importance of this to bone and overall health Last vitamin D Lab Results  Component Value Date   VD25OH 40.15 10/22/2022

## 2022-10-25 NOTE — Assessment & Plan Note (Signed)
Lab Results  Component Value Date   HGBA1C 6.0 10/22/2022   disc imp of low glycemic diet and wt loss to prevent DM2

## 2022-10-25 NOTE — Assessment & Plan Note (Signed)
Reviewed health habits including diet and exercise and skin cancer prevention Reviewed appropriate screening tests for age  Also reviewed health mt list, fam hx and immunization status , as well as social and family history   See HPI Labs reviewed and ordered Mammogram utd 06/2022 Colonoscopy 07/2021 with 5 y recall Dexa ordered/no falls or fx  Disc bone health and exercise  PHQ 0

## 2022-10-25 NOTE — Assessment & Plan Note (Signed)
bp in fair control at this time  BP Readings from Last 1 Encounters:  10/25/22 122/68   No changes needed Most recent labs reviewed  Disc lifstyle change with low sodium diet and exercise  Plan to continue hctz 25 mg daily

## 2022-10-25 NOTE — Assessment & Plan Note (Signed)
Dexa ordered

## 2023-01-04 ENCOUNTER — Ambulatory Visit (INDEPENDENT_AMBULATORY_CARE_PROVIDER_SITE_OTHER): Payer: Medicare Other | Admitting: Orthopaedic Surgery

## 2023-01-04 ENCOUNTER — Other Ambulatory Visit (INDEPENDENT_AMBULATORY_CARE_PROVIDER_SITE_OTHER): Payer: Medicare Other

## 2023-01-04 DIAGNOSIS — Z96642 Presence of left artificial hip joint: Secondary | ICD-10-CM | POA: Diagnosis not present

## 2023-01-04 NOTE — Progress Notes (Signed)
Post-Op Visit Note   Patient: Denise Ali           Date of Birth: 08-11-1950           MRN: [id number] Visit Date: 01/04/2023 PCP: Judy Pimple, MD   Assessment & Plan:  Chief Complaint:  Chief Complaint  Patient presents with   Left Hip - Pain   Visit Diagnoses:  1. Status post total replacement of left hip     Plan: Patient is a very pleasant 72 year old female who comes in today 2 years status post left total hip replacement 01/09/2021.  She has been doing great.  No complaints.  Examination left hip reveals painless hip flexion and logroll.  She is neurovascularly intact distally.  At this point, she will continue to advance with activity.  Dental prophylaxis is no longer reinforced.  Follow-up with [place] every few years or as needed.  Call with concerns or questions.  Follow-Up Instructions: Return if symptoms worsen or fail to improve.   Orders:  Orders Placed This Encounter  Procedures   XR Pelvis 1-2 Views   No orders of the defined types were placed in this encounter.   Imaging: XR Pelvis 1-2 Views  Result Date: 01/04/2023 Well-seated prosthesis without complication   PMFS History: Patient Active Problem List   Diagnosis Date Noted   Medicare annual wellness visit, subsequent 09/20/2021   Status post total replacement of left hip 01/09/2021   Primary osteoarthritis of left hip 09/21/2020   Left groin pain 09/14/2020   Pain in joint, ankle and foot, left 08/19/2018   Colon polyp 01/22/2018   Estrogen deficiency 10/09/2016   Prediabetes 10/09/2016   Obesity 12/24/2014   Hypertension 10/05/2014   Breast cancer screening, high risk patient 10/18/2011   Routine general medical examination at a health care facility 07/02/2011   Encounter for routine gynecological examination 07/02/2011   Irregular heart beat 04/09/2011   HYPOKALEMIA 04/22/2009   Vitamin D deficiency 02/24/2009   STRESS REACTION, ACUTE, WITH EMOTIONAL DISTURBANCE 11/25/2007    Osteopenia 01/14/2007   EDEMA 01/14/2007   Past Medical History:  Diagnosis Date   Anemia    Arthritis    Bradycardia 03/2011   mild - at one time with PACs/ asymptomatic    Breast cancer screening, high risk patient 10/18/2011   Cataracts, bilateral    Colon polyp 12/2007   Disorder of bone and cartilage, unspecified    Edema    Family history of malignant neoplasm of breast    evista for BRCA ppx   Fibrocystic breast    Human papillomavirus in conditions classified elsewhere and of unspecified site    Hypertension    Hypopotassemia    Neoplasm of uncertain behavior of skin    Osteopenia    Osteoporosis    Other screening mammogram    Pneumonia    Predominant disturbance of emotions    Routine gynecological examination    Seasonal allergies    Unspecified vitamin D deficiency     Family History  Problem Relation Age of Onset   Breast cancer Mother    Osteoporosis Mother    Diabetes Father    Skin cancer Father        ?   Osteopenia Sister    Breast cancer Sister    Heart attack Paternal Grandfather    Breast cancer Other        cousin/Grandmother   Colon cancer Neg Hx    Esophageal cancer Neg Hx  Rectal cancer Neg Hx    Stomach cancer Neg Hx    Colon polyps Neg Hx     Past Surgical History:  Procedure Laterality Date   BREAST BIOPSY  01/20/2010   BREAST BIOPSY  02/20/2010   Fibrocystic change   Breast MRI  01/20/2010   Normal   cataracts  03/24/2011   bilateral   COLONOSCOPY     COLONOSCOPY W/ POLYPECTOMY  2019   DEXA  12/22/2003   osteopenia   DEXA  05/23/2006   stable   DEXA  07/23/2008   Osteopenia-slightly improved   EYE SURGERY Bilateral    cataracts removed   LEEP     HPV   POLYPECTOMY     procedure for "bad pap"     SKIN BIOPSY Left 2023   hand   TONSILLECTOMY     TOTAL HIP ARTHROPLASTY Left 01/09/2021   Procedure: LEFT TOTAL HIP ARTHROPLASTY ANTERIOR APPROACH;  Surgeon: Tarry Kos, MD;  Location: [facility];  Service: Orthopedics;   Laterality: Left;  C-3   Social History   Occupational History   Occupation: Social Services-works at [employer]  Tobacco Use   Smoking status: Former    Types: Cigarettes    Quit date: 07/23/1985    Years since quitting: 37.4   Smokeless tobacco: Never   Tobacco comments:    Quit "years ago"  Vaping Use   Vaping Use: Never used  Substance and Sexual Activity   Alcohol use: Yes    Alcohol/week: 0.0 standard drinks of alcohol    Comment: Occasional   Drug use: No   Sexual activity: Not Currently

## 2023-06-06 ENCOUNTER — Ambulatory Visit (INDEPENDENT_AMBULATORY_CARE_PROVIDER_SITE_OTHER): Payer: Medicare Other | Admitting: Family Medicine

## 2023-06-06 ENCOUNTER — Encounter: Payer: Self-pay | Admitting: Family Medicine

## 2023-06-06 VITALS — BP 122/82 | HR 61 | Temp 97.9°F | Ht 63.75 in | Wt 217.0 lb

## 2023-06-06 DIAGNOSIS — M79604 Pain in right leg: Secondary | ICD-10-CM

## 2023-06-06 MED ORDER — MELOXICAM 15 MG PO TABS: 15.0000 mg | ORAL_TABLET | Freq: Every day | ORAL | 0 refills | Status: DC

## 2023-06-06 NOTE — Progress Notes (Signed)
Patient ID: Denise Ali, female    DOB: 1951/01/07, 72 y.o.   MRN: [id number]  This visit was conducted in person.  BP 122/82 (BP Location: Left Arm, Patient Position: Sitting, Cuff Size: Large)   Pulse 61   Temp 97.9 F (36.6 C) (Oral)   Ht 5' 3.75" (1.619 m)   Wt 217 lb (98.4 kg)   SpO2 98%   BMI 37.54 kg/m    CC:  Chief Complaint  Patient presents with   Leg Pain    Right leg/knee pain x 1-2 weeks. Patient states its a constant pain no matter what she is doing or position she's in. She takes tylenol for the pain but does not help. There have been no injuries or anything she can note that has been the cause of the pain.     Subjective:   HPI: Denise Ali is a 72 y.o. female patient of Dr. Milinda Antis presenting on 06/06/2023 for Leg Pain (Right leg/knee pain x 1-2 weeks. Patient states its a constant pain no matter what she is doing or position she's in. She takes tylenol for the pain but does not help. There have been no injuries or anything she can note that has been the cause of the pain. )   She is noted new onset right  anterior  shin pain, tightness behind knee pain in the last 1 to 2 weeks. Pain is constant and unchanged depending on activity and position. She describes the pain as burning. She is using Tylenol ES one daily for pain without relief. No known fall or injury preceding.  No swelling in legs.   Has varicose  veins.  No numbness, no weakness.  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 Interim medical history since our last visit reviewed. Allergies and medications reviewed and updated. Outpatient Medications Prior to Visit  Medication Sig Dispense Refill   acetaminophen (TYLENOL) 500 MG tablet Take 500 mg by mouth as needed.     Calcium 1200-1000 MG-UNIT CHEW Chew 2 tablets by mouth daily.      Cholecalciferol (VITAMIN D) 50 MCG (2000 UT) CAPS Take 2,000 Units by mouth daily.      hydrochlorothiazide (HYDRODIURIL) 25 MG tablet Take 1 tablet (25 mg total) by mouth daily. 90 tablet 3   amoxicillin (AMOXIL) 500 MG capsule TAKE 4 CAPSULES 30 MINUTES PRIOR TO DENTAL PROCEDURE/colonoscopy 8 capsule 2   No facility-administered medications prior to visit.     Per HPI unless specifically indicated in ROS section below Review of Systems  Constitutional:  Negative for fatigue and fever.  HENT:  Negative for congestion.   Eyes:  Negative for pain.  Respiratory:  Negative for cough and shortness of breath.   Cardiovascular:  Negative for chest pain, palpitations and leg swelling.  Gastrointestinal:  Negative for abdominal pain.  Genitourinary:  Negative for dysuria and vaginal bleeding.  Musculoskeletal:  Negative for back pain.  Neurological:  Negative for syncope, light-headedness and headaches.  Psychiatric/Behavioral:  Negative for dysphoric mood.    Objective:  BP 122/82 (BP Location: Left Arm, Patient Position: Sitting, Cuff Size: Large)   Pulse 61   Temp 97.9 F (36.6 C) (Oral)   Ht 5' 3.75" (1.619 m)   Wt 217 lb (98.4 kg)   SpO2 98%   BMI 37.54 kg/m  Wt Readings from Last 3 Encounters:  06/06/23 217 lb (98.4 kg)  10/25/22 207 lb 8 oz (94.1 kg)  10/23/22 215 lb (97.5 kg)      Physical Exam Constitutional:      General: She is not in acute distress.    Appearance: Normal appearance. She is well-developed. She is not ill-appearing or toxic-appearing.  HENT:     Head: Normocephalic.     Right Ear: Hearing, tympanic membrane, ear canal and external ear normal. Tympanic membrane is not erythematous, retracted or bulging.     Left Ear: Hearing, tympanic membrane, ear canal and external ear normal. Tympanic membrane is not erythematous, retracted or bulging.     Nose: No mucosal edema or rhinorrhea.     Right Sinus: No maxillary sinus tenderness or frontal sinus  tenderness.     Left Sinus: No maxillary sinus tenderness or frontal sinus tenderness.     Mouth/Throat:     Mouth: Oropharynx is clear and moist and mucous membranes are normal.     Pharynx: Uvula midline.  Eyes:     General: Lids are normal. Lids are everted, no foreign bodies appreciated.     Extraocular Movements: EOM normal.     Conjunctiva/sclera: Conjunctivae normal.     Pupils: Pupils are equal, round, and reactive to light.  Neck:     Thyroid: No thyroid mass or thyromegaly.     Vascular: No carotid bruit.     Trachea: Trachea normal.  Cardiovascular:     Rate and Rhythm: Normal rate and regular rhythm.     Pulses: Normal pulses.     Heart sounds: Normal heart sounds, S1 normal and S2 normal. No murmur heard.    No friction rub. No gallop.     Comments:  Chronic left lower leg swelling.. not new   Bilateral varicose veins, no evidence of superficial thrombus Pulmonary:     Effort: Pulmonary effort is normal. No tachypnea or respiratory distress.     Breath sounds: Normal breath sounds. No decreased breath sounds, wheezing, rhonchi or rales.  Abdominal:     General: Bowel sounds are normal.     Palpations: Abdomen is soft.     Tenderness: There is no abdominal tenderness.  Musculoskeletal:     Cervical back: Normal range of motion and neck supple.     Right knee: Normal. No bony tenderness or crepitus. Normal range of motion. Normal meniscus.     Left knee: Normal. No bony tenderness or crepitus. Normal range of motion. Normal meniscus.     Right lower leg: Tenderness present. No swelling or bony tenderness. No edema.     Left lower leg: Edema present.     Right ankle: Normal.     Right Achilles Tendon: Normal.     Left ankle: Normal.     Left Achilles Tendon: Normal.     Right foot: Normal.     Left foot: Normal.       Legs:  Skin:    General: Skin is warm, dry and intact.     Findings: No rash.  Neurological:     Mental Status: She is alert.  Psychiatric:         Mood and Affect: Mood is not anxious or depressed.        Speech: Speech normal.        Behavior: Behavior normal. Behavior is cooperative.        Thought Content: Thought content normal.  Cognition and Memory: Cognition and memory normal.        Judgment: Judgment normal.       Results for orders placed or performed in visit on 10/22/22  Hemoglobin A1c  Result Value Ref Range   Hgb A1c MFr Bld 6.0 4.6 - 6.5 %  VITAMIN D 25 Hydroxy (Vit-D Deficiency, Fractures)  Result Value Ref Range   VITD 40.15 30.00 - 100.00 ng/mL  CBC with Differential/Platelet  Result Value Ref Range   WBC 5.8 4.0 - 10.5 K/uL   RBC 4.25 3.87 - 5.11 Mil/uL   Hemoglobin 13.2 12.0 - 15.0 g/dL   HCT 96.2 95.2 - 84.1 %   MCV 91.6 78.0 - 100.0 fl   MCHC 33.9 30.0 - 36.0 g/dL   RDW 32.4 40.1 - 02.7 %   Platelets 234.0 150.0 - 400.0 K/uL   Neutrophils Relative % 57.2 43.0 - 77.0 %   Lymphocytes Relative 29.7 12.0 - 46.0 %   Monocytes Relative 9.4 3.0 - 12.0 %   Eosinophils Relative 2.4 0.0 - 5.0 %   Basophils Relative 1.3 0.0 - 3.0 %   Neutro Abs 3.3 1.4 - 7.7 K/uL   Lymphs Abs 1.7 0.7 - 4.0 K/uL   Monocytes Absolute 0.5 0.1 - 1.0 K/uL   Eosinophils Absolute 0.1 0.0 - 0.7 K/uL   Basophils Absolute 0.1 0.0 - 0.1 K/uL  Comprehensive metabolic panel  Result Value Ref Range   Sodium 137 135 - 145 mEq/L   Potassium 3.6 3.5 - 5.1 mEq/L   Chloride 104 96 - 112 mEq/L   CO2 28 19 - 32 mEq/L   Glucose, Bld 99 70 - 99 mg/dL   BUN 20 6 - 23 mg/dL   Creatinine, Ser 2.53 0.40 - 1.20 mg/dL   Total Bilirubin 0.8 0.2 - 1.2 mg/dL   Alkaline Phosphatase 57 39 - 117 U/L   AST 17 0 - 37 U/L   ALT 11 0 - 35 U/L   Total Protein 6.8 6.0 - 8.3 g/dL   Albumin 3.9 3.5 - 5.2 g/dL   GFR 66.44 >03.47 mL/min   Calcium 10.0 8.4 - 10.5 mg/dL  Lipid panel  Result Value Ref Range   Cholesterol 186 0 - 200 mg/dL   Triglycerides 42.5 0.0 - 149.0 mg/dL   HDL 95.63 >87.56 mg/dL   VLDL 43.3 0.0 - 29.5 mg/dL   LDL  Cholesterol 188 (H) 0 - 99 mg/dL   Total CHOL/HDL Ratio 3    NonHDL 116.43   TSH  Result Value Ref Range   TSH 2.66 0.35 - 5.50 uIU/mL    Assessment and Plan  Anterior leg pain, right Assessment & Plan: Acute, no clear sign of DVT (no calf swelling, no calf pain) Pain may be secondary to nerve compression given she crosses her legs frequently below the knee when in the recliner versus possible varicose vein pain. Normal pulses bilaterally. No clear sign of pain source in knee ankle or other joint.  Recommend not crossing legs, elevation and can try wearing compression hose to see if this helps with the issue.  Recommended gentle stretching.  If not improving as expected we can consider lab evaluation for sodium and B12 vs evaluation for peripheral neuropathy with nerve conduction.   Other orders -     Meloxicam; Take 1 tablet (15 mg total) by mouth daily.  Dispense: 30 tablet; Refill: 0    No follow-ups on file.   Kerby Nora, MD

## 2023-06-06 NOTE — Assessment & Plan Note (Signed)
Acute, no clear sign of DVT (no calf swelling, no calf pain) Pain may be secondary to nerve compression given she crosses her legs frequently below the knee when in the recliner versus possible varicose vein pain. Normal pulses bilaterally. No clear sign of pain source in knee ankle or other joint.  Recommend not crossing legs, elevation and can try wearing compression hose to see if this helps with the issue.  Recommended gentle stretching.  If not improving as expected we can consider lab evaluation for sodium and B12 vs evaluation for peripheral neuropathy with nerve conduction.

## 2023-07-11 LAB — HM MAMMOGRAPHY

## 2023-10-20 ENCOUNTER — Telehealth: Payer: Self-pay | Admitting: Family Medicine

## 2023-10-20 DIAGNOSIS — R7303 Prediabetes: Secondary | ICD-10-CM

## 2023-10-20 DIAGNOSIS — I10 Essential (primary) hypertension: Secondary | ICD-10-CM

## 2023-10-20 NOTE — Telephone Encounter (Signed)
-----   Message from Alvina Chou sent at 10/04/2023  9:53 AM EDT ----- Regarding: Lab orders for MON, 3.31.25 Patient is scheduled for CPX labs, please order future labs, Thanks , Camelia Eng

## 2023-10-21 ENCOUNTER — Other Ambulatory Visit (INDEPENDENT_AMBULATORY_CARE_PROVIDER_SITE_OTHER): Payer: Medicare Other

## 2023-10-21 DIAGNOSIS — I10 Essential (primary) hypertension: Secondary | ICD-10-CM

## 2023-10-21 DIAGNOSIS — R7303 Prediabetes: Secondary | ICD-10-CM

## 2023-10-21 LAB — CBC WITH DIFFERENTIAL/PLATELET
Basophils Absolute: 0.1 10*3/uL (ref 0.0–0.1)
Basophils Relative: 1 % (ref 0.0–3.0)
Eosinophils Absolute: 0.1 10*3/uL (ref 0.0–0.7)
Eosinophils Relative: 1.7 % (ref 0.0–5.0)
HCT: 38.9 % (ref 36.0–46.0)
Hemoglobin: 13.1 g/dL (ref 12.0–15.0)
Lymphocytes Relative: 31.1 % (ref 12.0–46.0)
Lymphs Abs: 2 10*3/uL (ref 0.7–4.0)
MCHC: 33.7 g/dL (ref 30.0–36.0)
MCV: 92.6 fl (ref 78.0–100.0)
Monocytes Absolute: 0.6 10*3/uL (ref 0.1–1.0)
Monocytes Relative: 9.4 % (ref 3.0–12.0)
Neutro Abs: 3.7 10*3/uL (ref 1.4–7.7)
Neutrophils Relative %: 56.8 % (ref 43.0–77.0)
Platelets: 238 10*3/uL (ref 150.0–400.0)
RBC: 4.2 Mil/uL (ref 3.87–5.11)
RDW: 13.5 % (ref 11.5–15.5)
WBC: 6.6 10*3/uL (ref 4.0–10.5)

## 2023-10-21 LAB — TSH: TSH: 2.32 u[IU]/mL (ref 0.35–5.50)

## 2023-10-21 LAB — COMPREHENSIVE METABOLIC PANEL WITH GFR
ALT: 12 U/L (ref 0–35)
AST: 14 U/L (ref 0–37)
Albumin: 3.9 g/dL (ref 3.5–5.2)
Alkaline Phosphatase: 62 U/L (ref 39–117)
BUN: 21 mg/dL (ref 6–23)
CO2: 31 meq/L (ref 19–32)
Calcium: 10.2 mg/dL (ref 8.4–10.5)
Chloride: 104 meq/L (ref 96–112)
Creatinine, Ser: 0.72 mg/dL (ref 0.40–1.20)
GFR: 83.63 mL/min (ref >60.00)
Glucose, Bld: 117 mg/dL — ABNORMAL HIGH (ref 70–99)
Potassium: 4 meq/L (ref 3.5–5.1)
Sodium: 140 meq/L (ref 135–145)
Total Bilirubin: 0.8 mg/dL (ref 0.2–1.2)
Total Protein: 6.6 g/dL (ref 6.0–8.3)

## 2023-10-21 LAB — LIPID PANEL
Cholesterol: 164 mg/dL (ref 0–200)
HDL: 56.8 mg/dL (ref >39.00)
LDL Cholesterol: 90 mg/dL (ref 0–99)
NonHDL: 106.96
Total CHOL/HDL Ratio: 3
Triglycerides: 85 mg/dL (ref 0.0–149.0)
VLDL: 17 mg/dL (ref 0.0–40.0)

## 2023-10-21 LAB — HEMOGLOBIN A1C: Hgb A1c MFr Bld: 6 % (ref 4.6–6.5)

## 2023-10-24 ENCOUNTER — Other Ambulatory Visit: Payer: Self-pay | Admitting: Family Medicine

## 2023-10-24 ENCOUNTER — Ambulatory Visit: Payer: Medicare Other

## 2023-10-24 VITALS — Ht 63.5 in | Wt 217.0 lb

## 2023-10-24 DIAGNOSIS — Z0000 Encounter for general adult medical examination without abnormal findings: Secondary | ICD-10-CM

## 2023-10-24 DIAGNOSIS — Z13820 Encounter for screening for osteoporosis: Secondary | ICD-10-CM

## 2023-10-24 DIAGNOSIS — Z1231 Encounter for screening mammogram for malignant neoplasm of breast: Secondary | ICD-10-CM

## 2023-10-24 NOTE — Patient Instructions (Signed)
 Denise Ali , Thank you for taking time to come for your Medicare Wellness Visit. I appreciate your ongoing commitment to your health goals. Please review the following plan we discussed and let me know if I can assist you in the future.   Referrals/Orders/Follow-Ups/Clinician Recommendations:   You have an order for:  []   2D Mammogram  []   3D Mammogram  [x]   Bone Density     Please call for appointment:  [facility] [address] [place], [place] [address] [phone]  [facility] [address] [place], [place] [address] [phone]  [facility] [address] [place], [place] [address] [phone]  [facility] [address] [place], [place] [address] [phone]  [facility] [address] [place], [place] [address] [phone]    Make sure to wear two-piece clothing.  No lotions, powders, or deodorants the day of the appointment. Make sure to bring picture ID and insurance card.  Bring list of medications you are currently taking including any supplements.    This is a list of the screening recommended for you and due dates:  Health Maintenance  Topic Date Due   Hepatitis C Screening  Never done   Zoster (Shingles) Vaccine (1 of 2) 07/22/2001   COVID-19 Vaccine (6 - 2024-25 season) 03/24/2023   DTaP/Tdap/Td vaccine (3 - Td or Tdap) 04/01/2023   Flu Shot  02/21/2024   Mammogram  07/10/2024   Medicare Annual Wellness Visit  10/23/2024   Colon Cancer Screening  08/18/2026   Pneumonia Vaccine  Completed   DEXA scan (bone density measurement)  Completed   HPV Vaccine  Aged Out    Advanced directives: (Declined) Advance directive discussed with you today. Even though you declined this today, please call our office should you change your mind, and we can give you the proper paperwork for you to fill out.  Next Medicare  Annual Wellness Visit scheduled for next year: Yes 10/27/23 @ 3pm televisit

## 2023-10-24 NOTE — Progress Notes (Signed)
 Subjective:   Denise Ali is a 73 y.o. who presents for a Medicare Wellness preventive visit.  Visit Complete: Virtual I connected with  Alveda Reasons on 10/24/23 by a audio enabled telemedicine application and verified that I am speaking with the correct person using two identifiers.  Patient Location: Home  Provider Location: Home Office  I discussed the limitations of evaluation and management by telemedicine. The patient expressed understanding and agreed to proceed.  Vital Signs: Because this visit was a virtual/telehealth visit, some criteria may be missing or patient reported. Any vitals not documented were not able to be obtained and vitals that have been documented are patient reported.  VideoDeclined- This patient declined Librarian, academic. Therefore the visit was completed with audio only.  Persons Participating in Visit: Patient.  AWV Questionnaire: No: Patient Medicare AWV questionnaire was not completed prior to this visit.  Cardiac Risk Factors include: advanced age (>1men, >85 women);hypertension;obesity (BMI >30kg/m2)     Objective:    Today's Vitals   10/24/23 1514  Weight: 217 lb (98.4 kg)  Height: 5' 3.5" (1.613 m)   Body mass index is 37.84 kg/m.     10/24/2023    3:26 PM 10/23/2022   10:15 AM 01/09/2021    5:20 PM 01/05/2021   10:08 AM 11/18/2014    2:51 PM  Advanced Directives  Does Patient Have a Medical Advance Directive? No Yes No No No  Type of Special educational needs teacher of [place];Living will     Does patient want to make changes to medical advance directive?   No - Patient declined Yes (MAU/Ambulatory/Procedural Areas - Information given)   Copy of Healthcare Power of Attorney in Chart?  No - copy requested     Would patient like information on creating a medical advance directive?   No - Patient declined      Current Medications (verified) Outpatient Encounter Medications as of 10/24/2023   Medication Sig   acetaminophen (TYLENOL) 500 MG tablet Take 500 mg by mouth as needed.   Calcium 1200-1000 MG-UNIT CHEW Chew 2 tablets by mouth daily.   Cholecalciferol (VITAMIN D) 50 MCG (2000 UT) CAPS Take 2,000 Units by mouth daily.    hydrochlorothiazide (HYDRODIURIL) 25 MG tablet Take 1 tablet (25 mg total) by mouth daily.   meloxicam (MOBIC) 15 MG tablet Take 1 tablet (15 mg total) by mouth daily. (Patient not taking: Reported on 10/24/2023)   No facility-administered encounter medications on file as of 10/24/2023.    Allergies (verified) Alendronate sodium   History: Past Medical History:  Diagnosis Date   Allergy ?   Pollen   Anemia    Arthritis    Bradycardia 03/2011   mild - at one time with PACs/ asymptomatic    Breast cancer screening, high risk patient 10/18/2011   Cataracts, bilateral    Colon polyp 12/2007   Disorder of bone and cartilage, unspecified    Edema    Family history of malignant neoplasm of breast    evista for BRCA ppx   Fibrocystic breast    Human papillomavirus in conditions classified elsewhere and of unspecified site    Hypertension    Hypopotassemia    Neoplasm of uncertain behavior of skin    Osteopenia    Osteoporosis    Other screening mammogram    Pneumonia    Predominant disturbance of emotions    Routine gynecological examination    Seasonal allergies    Unspecified vitamin  D deficiency    Past Surgical History:  Procedure Laterality Date   BREAST BIOPSY  01/20/2010   BREAST BIOPSY  02/20/2010   Fibrocystic change   Breast MRI  01/20/2010   Normal   cataracts  03/24/2011   bilateral   COLONOSCOPY     COLONOSCOPY W/ POLYPECTOMY  2019   DEXA  12/22/2003   osteopenia   DEXA  05/23/2006   stable   DEXA  07/23/2008   Osteopenia-slightly improved   EYE SURGERY Bilateral    cataracts removed   JOINT REPLACEMENT  01/09/21   Left hip   LEEP     HPV   POLYPECTOMY     procedure for "bad pap"     SKIN BIOPSY Left 2023   hand    TONSILLECTOMY     TOTAL HIP ARTHROPLASTY Left 01/09/2021   Procedure: LEFT TOTAL HIP ARTHROPLASTY ANTERIOR APPROACH;  Surgeon: Tarry Kos, MD;  Location: [facility] OR;  Service: Orthopedics;  Laterality: Left;  C-3   Family History  Problem Relation Age of Onset   Breast cancer Mother    Osteoporosis Mother    Diabetes Father    Skin cancer Father        ?   Osteopenia Sister    Breast cancer Sister    Heart attack Paternal Grandfather    Breast cancer Other        cousin/Grandmother   Colon cancer Neg Hx    Esophageal cancer Neg Hx    Rectal cancer Neg Hx    Stomach cancer Neg Hx    Colon polyps Neg Hx    Social History   Socioeconomic History   Marital status: Divorced    Spouse name: Not on file   Number of children: 1   Years of education: Not on file   Highest education level: Some college, no degree  Occupational History   Occupation: Surveyor, mining at food [employer]  Tobacco Use   Smoking status: Former    Current packs/day: 0.00    Types: Cigarettes    Quit date: 07/23/1985    Years since quitting: 38.2   Smokeless tobacco: Never   Tobacco comments:    Quit "years ago"  Vaping Use   Vaping status: Never Used  Substance and Sexual Activity   Alcohol use: Yes    Alcohol/week: 0.0 standard drinks of alcohol    Comment: Occasional   Drug use: No   Sexual activity: Not Currently  Other Topics Concern   Not on file  Social History Narrative   Works very long hours      Quit smoking year ago            Social Drivers of Corporate investment banker Strain: Low Risk  (10/24/2023)   Overall Financial Resource Strain (CARDIA)    Difficulty of Paying Living Expenses: Not hard at all  Food Insecurity: No Food Insecurity (10/24/2023)   Hunger Vital Sign    Worried About Running Out of Food in the Last Year: Never true    Ran Out of Food in the Last Year: Never true  Transportation Needs: No Transportation Needs (10/24/2023)   PRAPARE - Doctor, general practice (Medical): No    Lack of Transportation (Non-Medical): No  Physical Activity: Insufficiently Active (10/24/2023)   Exercise Vital Sign    Days of Exercise per Week: 3 days    Minutes of Exercise per Session: 40 min  Stress: No Stress Concern Present (  10/24/2023)   [place] [employer] - Occupational Stress Questionnaire    Feeling of Stress : Not at all  Social Connections: Moderately Isolated (10/24/2023)   Social Connection and Isolation Panel [NHANES]    Frequency of Communication with Friends and Family: More than three times a week    Frequency of Social Gatherings with Friends and Family: Once a week    Attends Religious Services: More than 4 times per year    Active Member of [employer] or Organizations: No    Attends Engineer, structural: Never    Marital Status: Divorced    Tobacco Counseling Counseling given: Not Answered Tobacco comments: Quit "years ago"    Clinical Intake:  Pre-visit preparation completed: Yes  Pain : No/denies pain    BMI - recorded: 37.84 Nutritional Status: BMI > 30  Obese Nutritional Risks: None Diabetes: No  Lab Results  Component Value Date   HGBA1C 6.0 10/21/2023   HGBA1C 6.0 10/22/2022   HGBA1C 6.0 09/13/2021     How often do you need to have someone help you when you read instructions, pamphlets, or other written materials from your doctor or pharmacy?: 1 - Never  Interpreter Needed?: No  Comments: lives alone Information entered by :: B.Suleyman Ehrman,LPN   Activities of Daily Living     10/24/2023    3:27 PM  In your present state of health, do you have any difficulty performing the following activities:  Hearing? 0  Vision? 0  Difficulty concentrating or making decisions? 0  Walking or climbing stairs? 0  Dressing or bathing? 0  Doing errands, shopping? 0  Preparing Food and eating ? N  Using the Toilet? N  In the past six months, have you accidently leaked urine? N  Do you have  problems with loss of bowel control? N  Managing your Medications? N  Managing your Finances? N  Housekeeping or managing your Housekeeping? N    Patient Care Team: Tower, Audrie Gallus, MD as PCP - Claire Shown, [facility]  Indicate any recent Medical Services you may have received from other than [facility] providers in the past year (date may be approximate).     Assessment:   This is a routine wellness examination for [facility].  Hearing/Vision screen Hearing Screening - Comments:: Pt says her hearing is good Vision Screening - Comments:: Pt says her vision is good;wears glasses for driving [place]    Goals Addressed             This Visit's Progress    Patient Stated       10/24/23-Exercise more and lose weight.       Depression Screen     10/24/2023    3:23 PM 06/06/2023   10:46 AM 10/23/2022   10:15 AM 09/20/2021   10:29 AM 09/14/2020   11:02 AM 05/12/2019    3:29 PM 05/12/2019    3:28 PM  PHQ 2/9 Scores  PHQ - 2 Score 0 0 0 0 0 0 0  PHQ- 9 Score  1  0 3      Fall Risk     10/24/2023    3:17 PM 06/06/2023   10:46 AM 10/23/2022   10:16 AM 09/20/2021   10:29 AM 12/23/2020    3:07 PM  Fall Risk   Falls in the past year? 0 0 0 0 1  Number falls in past yr: 0 0 0 0 1  Injury with Fall? 0 0 0 0 0  Risk for fall due to : No Fall Risks No Fall Risks No Fall Risks    Follow up Education provided;Falls prevention discussed Falls evaluation completed Falls prevention discussed;Falls evaluation completed      MEDICARE RISK AT HOME:  Medicare Risk at Home Any stairs in or around the home?: No If so, are there any without handrails?: No Home free of loose throw rugs in walkways, pet beds, electrical cords, etc?: Yes Adequate lighting in your home to reduce risk of falls?: Yes Life alert?: No Use of a cane, walker or w/c?: Yes (cane long distances) Grab bars in the bathroom?: No Shower chair or bench in shower?: Yes (does not use) Elevated toilet seat  or a handicapped toilet?: Yes  TIMED UP AND GO:  Was the test performed?  No  Cognitive Function: 6CIT completed        10/24/2023    3:34 PM 10/23/2022   10:18 AM  6CIT Screen  What Year? 0 points 0 points  What month? 0 points 0 points  What time? 0 points 0 points  Count back from 20 0 points 0 points  Months in reverse 0 points 0 points  Repeat phrase 2 points 0 points  Total Score 2 points 0 points    Immunizations Immunization History  Administered Date(s) Administered   Fluad Quad(high Dose 65+) 04/04/2022   Influenza Split 07/02/2011   Influenza Whole 06/04/2006, 05/08/2008, 04/14/2010   Influenza, High Dose Seasonal PF 04/28/2019   Influenza, Seasonal, Injecte, Preservative Fre 05/14/2014, 04/27/2015   Influenza,inj,Quad PF,6+ Mos 05/07/2018   Influenza-Unspecified 04/22/2013, 04/26/2016, 05/04/2020, 05/03/2021   PFIZER Comirnaty(Gray Top)Covid-19 Tri-Sucrose Vaccine 12/25/2020   PFIZER(Purple Top)SARS-COV-2 Vaccination 08/29/2019, 09/22/2019, 05/04/2020   Pfizer(Comirnaty)Fall Seasonal Vaccine 12 years and older 05/30/2022   Pneumococcal Conjugate-13 10/09/2016   Pneumococcal Polysaccharide-23 01/22/2018   Td 11/09/2003   Tdap 03/31/2013   Zoster, Live 04/27/2015    Screening Tests Health Maintenance  Topic Date Due   Hepatitis C Screening  Never done   Zoster Vaccines- Shingrix (1 of 2) 07/22/2001   COVID-19 Vaccine (6 - 2024-25 season) 03/24/2023   DTaP/Tdap/Td (3 - Td or Tdap) 04/01/2023   INFLUENZA VACCINE  02/21/2024   MAMMOGRAM  07/10/2024   Medicare Annual Wellness (AWV)  10/23/2024   Colonoscopy  08/18/2026   Pneumonia Vaccine 31+ Years old  Completed   DEXA SCAN  Completed   HPV VACCINES  Aged Out    Health Maintenance  Health Maintenance Due  Topic Date Due   Hepatitis C Screening  Never done   Zoster Vaccines- Shingrix (1 of 2) 07/22/2001   COVID-19 Vaccine (6 - 2024-25 season) 03/24/2023   DTaP/Tdap/Td (3 - Td or Tdap) 04/01/2023    Health Maintenance Items Addressed: DEXA ordered  Additional Screening:  Vision Screening: Recommended annual ophthalmology exams for early detection of glaucoma and other disorders of the eye.  Dental Screening: Recommended annual dental exams for proper oral hygiene  Community Resource Referral / Chronic Care Management: CRR required this visit?  No   CCM required this visit?  No     Plan:     I have personally reviewed and noted the following in the patient's chart:   Medical and social history Use of alcohol, tobacco or illicit drugs  Current medications and supplements including opioid prescriptions. Patient is not currently taking opioid prescriptions. Functional ability and status Nutritional status Physical activity Advanced directives List of other physicians Hospitalizations, surgeries, and ER visits in previous 12 months Vitals  Screenings to include cognitive, depression, and falls Referrals and appointments  In addition, I have reviewed and discussed with patient certain preventive protocols, quality metrics, and best practice recommendations. A written personalized care plan for preventive services as well as general preventive health recommendations were provided to patient.     Sue Lush, LPN   02/20/1913   After Visit Summary: (MyChart) Due to this being a telephonic visit, the after visit summary with patients personalized plan was offered to patient via MyChart   Notes: Nothing significant to report at this time.

## 2023-10-28 ENCOUNTER — Ambulatory Visit (INDEPENDENT_AMBULATORY_CARE_PROVIDER_SITE_OTHER): Payer: Medicare Other | Admitting: Family Medicine

## 2023-10-28 ENCOUNTER — Encounter: Payer: Self-pay | Admitting: Family Medicine

## 2023-10-28 VITALS — BP 118/76 | HR 76 | Temp 98.1°F | Ht 63.75 in | Wt 218.1 lb

## 2023-10-28 DIAGNOSIS — Z1159 Encounter for screening for other viral diseases: Secondary | ICD-10-CM | POA: Insufficient documentation

## 2023-10-28 DIAGNOSIS — I10 Essential (primary) hypertension: Secondary | ICD-10-CM

## 2023-10-28 DIAGNOSIS — R7303 Prediabetes: Secondary | ICD-10-CM

## 2023-10-28 DIAGNOSIS — Z0000 Encounter for general adult medical examination without abnormal findings: Secondary | ICD-10-CM | POA: Diagnosis not present

## 2023-10-28 DIAGNOSIS — E2839 Other primary ovarian failure: Secondary | ICD-10-CM

## 2023-10-28 DIAGNOSIS — E66812 Obesity, class 2: Secondary | ICD-10-CM

## 2023-10-28 DIAGNOSIS — M8588 Other specified disorders of bone density and structure, other site: Secondary | ICD-10-CM

## 2023-10-28 DIAGNOSIS — Z6837 Body mass index (BMI) 37.0-37.9, adult: Secondary | ICD-10-CM

## 2023-10-28 DIAGNOSIS — Z1231 Encounter for screening mammogram for malignant neoplasm of breast: Secondary | ICD-10-CM | POA: Insufficient documentation

## 2023-10-28 DIAGNOSIS — D126 Benign neoplasm of colon, unspecified: Secondary | ICD-10-CM

## 2023-10-28 DIAGNOSIS — E6609 Other obesity due to excess calories: Secondary | ICD-10-CM

## 2023-10-28 MED ORDER — HYDROCHLOROTHIAZIDE 25 MG PO TABS: 25.0000 mg | ORAL_TABLET | Freq: Every day | ORAL | 3 refills | Status: AC

## 2023-10-28 NOTE — Assessment & Plan Note (Signed)
 Lab Results  Component Value Date   HGBA1C 6.0 10/21/2023   HGBA1C 6.0 10/22/2022   HGBA1C 6.0 09/13/2021   disc imp of low glycemic diet and wt loss to prevent DM2  Pt snacks at night-discussed strategies for this / hobby/keep hands busy/ do not buy processed snack foods

## 2023-10-28 NOTE — Assessment & Plan Note (Signed)
 Dexa ordered at [facility]

## 2023-10-28 NOTE — Assessment & Plan Note (Signed)
 bp in fair control at this time  BP Readings from Last 1 Encounters:  10/28/23 118/76   No changes needed Most recent labs reviewed  Disc lifstyle change with low sodium diet and exercise  Plan to continue hctz 25 mg daily

## 2023-10-28 NOTE — Assessment & Plan Note (Signed)
 Reviewed health habits including diet and exercise and skin cancer prevention Reviewed appropriate screening tests for age  Also reviewed health mt list, fam hx and immunization status , as well as social and family history   See HPI Labs reviewed and ordered Health Maintenance  Topic Date Due   Hepatitis C Screening  Never done   Zoster (Shingles) Vaccine (1 of 2) 01/27/2024*   DTaP/Tdap/Td vaccine (3 - Td or Tdap) 10/27/2024*   COVID-19 Vaccine (6 - 2024-25 season) 11/12/2025*   Flu Shot  02/21/2024   Mammogram  07/10/2024   Medicare Annual Wellness Visit  10/23/2024   Colon Cancer Screening  08/18/2026   Pneumonia Vaccine  Completed   DEXA scan (bone density measurement)  Completed   HPV Vaccine  Aged Out  *Topic was postponed. The date shown is not the original due date.    Considering shingrix vaccine Mammo and dexa ordered Discussed fall prevention, supplements and exercise for bone density  PHQ 0

## 2023-10-28 NOTE — Assessment & Plan Note (Signed)
Dexa ordered  Last one 05/2019  Reviewed Alendronate caused bony pain  Evista caused hair loss  Disc strength building exercise On ca and D E level in nl range  No falls or fx

## 2023-10-28 NOTE — Patient Instructions (Addendum)
 Hep C screen today   If you are interested in the new shingles vaccine (Shingrix) - call your local pharmacy to check on coverage and availability    Keep walking  Continue going to the gym Add some strength training to your routine, this is important for bone and brain health and can reduce your risk of falls and help your body use insulin properly and regulate weight  Light weights, exercise bands , and internet videos are a good way to start  Yoga (chair or regular), machines , floor exercises or a gym with machines are also good options    Try to get most of your carbohydrates from produce (with the exception of white potatoes) and whole grains Eat less bread/pasta/rice/snack foods/cereals/sweets and other items from the middle of the grocery store (processed carbs)      You have an order for:  []   2D Mammogram  [x]   3D Mammogram  [x]   Bone Density     Please call for appointment:   []   [facility]  [address] [place] [address]  [phone]  []   [facility]   [address]  [place], [place] [address]  [phone]  []   [facility]      [address] [place], [place]        [phone]         [x]   [facility]  [address] [place], [place]  [phone]  [x]  [facility]   [address]   [place], [place] [address]  [phone]  []  [facility]  [address] [place], [place] [address] [phone]    Make sure to wear two piece clothing  No lotions powders or deodorants the day of the appointment Make sure to bring picture ID and insurance card.  Bring list of medications you are currently taking including any supplements.   Schedule your screening mammogram through MyChart!   Select [facility] imaging sites can now be scheduled through MyChart.  Log into your MyChart account.  Go to 'Visit' (or 'Appointments' if  on mobile App) --> Schedule an  Appointment  Under 'Select a Reason for Visit' choose the Mammogram  Screening option.  Complete the pre-visit questions  and select the time and place that  best fits your schedule

## 2023-10-28 NOTE — Assessment & Plan Note (Signed)
Colonoscopy 07/2021- with 5 y recall

## 2023-10-28 NOTE — Progress Notes (Signed)
 Subjective:    Patient ID: Alveda Reasons, female    DOB: [birth year], 73 y.o.   MRN: [id number]  HPI  Here for health maintenance exam and to review chronic medical problems   Wt Readings from Last 3 Encounters:  10/28/23 218 lb 2 oz (98.9 kg)  10/24/23 217 lb (98.4 kg)  06/06/23 217 lb (98.4 kg)   37.74 kg/m  Vitals:   10/28/23 0940  BP: 118/76  Pulse: 76  Temp: 98.1 F (36.7 C)  SpO2: 97%    Immunization History  Administered Date(s) Administered   Fluad Quad(high Dose 65+) 04/04/2022   Influenza Split 07/02/2011   Influenza Whole 06/04/2006, 05/08/2008, 04/14/2010   Influenza, High Dose Seasonal PF 04/28/2019   Influenza, Seasonal, Injecte, Preservative Fre 05/14/2014, 04/27/2015   Influenza,inj,Quad PF,6+ Mos 05/07/2018   Influenza-Unspecified 04/22/2013, 04/26/2016, 05/04/2020, 05/03/2021   PFIZER Comirnaty(Gray Top)Covid-19 Tri-Sucrose Vaccine 12/25/2020   PFIZER(Purple Top)SARS-COV-2 Vaccination 08/29/2019, 09/22/2019, 05/04/2020   Pfizer(Comirnaty)Fall Seasonal Vaccine 12 years and older 05/30/2022   Pneumococcal Conjugate-13 10/09/2016   Pneumococcal Polysaccharide-23 01/22/2018   Td 11/09/2003   Tdap 03/31/2013   Zoster, Live 04/27/2015    Health Maintenance Due  Topic Date Due   Hepatitis C Screening  Never done   Hep C screening =would like to do   Shingrix  Has zostavax in the past Had shingles once   Mammogram  is due , goes to [facility]  High risk  Self breast exam- no lumps   Gyn health No problems    Colon cancer screening  Colonoscopy 07/2021 with 5 year recall if healthy enough   Bone health  Dexa 05/2019  Osteopenia  Alendronate caused bony pain and evista caused hair loss  Falls- none Fractures-none  Supplements -taking vit D and caltrate  Last vitamin D Lab Results  Component Value Date   VD25OH 40.15 10/22/2022    Exercise : going to the gym at least 3 d per week -uses several machines  Wants to walk more    Sees derm yearly  No new lesions    Mood    10/28/2023   10:05 AM 10/24/2023    3:23 PM 06/06/2023   10:46 AM 10/23/2022   10:15 AM 09/20/2021   10:29 AM  Depression screen PHQ 2/9  Decreased Interest 0 0 0 0 0  Down, Depressed, Hopeless 0 0 0 0 0  PHQ - 2 Score 0 0 0 0 0  Altered sleeping 0  0  0  Tired, decreased energy 1  1  0  Change in appetite 0  0  0  Feeling bad or failure about yourself  0  0  0  Trouble concentrating 0  0  0  Moving slowly or fidgety/restless 0  0  0  Suicidal thoughts 0  0  0  PHQ-9 Score 1  1  0  Difficult doing work/chores Not difficult at all  Not difficult at all  Not difficult at all   HTN bp is stable today  No cp or palpitations or headaches or edema  No side effects to medicines  BP Readings from Last 3 Encounters:  10/28/23 118/76  06/06/23 122/82  10/25/22 122/68    Hydrochlorothiazide 25 mg daily   Lab Results  Component Value Date   NA 140 10/21/2023   K 4.0 10/21/2023   CO2 31 10/21/2023   GLUCOSE 117 (H) 10/21/2023   BUN 21 10/21/2023   CREATININE 0.72 10/21/2023   CALCIUM 10.2 10/21/2023  GFR 83.63 10/21/2023   GFRNONAA >60 01/10/2021    Prediabetes Lab Results  Component Value Date   HGBA1C 6.0 10/21/2023   HGBA1C 6.0 10/22/2022   HGBA1C 6.0 09/13/2021   No change Needs to work on diet  Weakness for after dinner snacking  Trying to eat more raw veggies and more salads     Cholesterol Lab Results  Component Value Date   CHOL 164 10/21/2023   CHOL 186 10/22/2022   CHOL 191 09/13/2021   Lab Results  Component Value Date   HDL 56.80 10/21/2023   HDL 70.00 10/22/2022   HDL 67.60 09/13/2021   Lab Results  Component Value Date   LDLCALC 90 10/21/2023   LDLCALC 105 (H) 10/22/2022   LDLCALC 104 (H) 09/13/2021   Lab Results  Component Value Date   TRIG 85.0 10/21/2023   TRIG 57.0 10/22/2022   TRIG 95.0 09/13/2021   Lab Results  Component Value Date   CHOLHDL 3 10/21/2023   CHOLHDL 3 10/22/2022    CHOLHDL 3 09/13/2021   No results found for: "LDLDIRECT"    Lab Results  Component Value Date   WBC 6.6 10/21/2023   HGB 13.1 10/21/2023   HCT 38.9 10/21/2023   MCV 92.6 10/21/2023   PLT 238.0 10/21/2023   Lab Results  Component Value Date   TSH 2.32 10/21/2023      Patient Active Problem List   Diagnosis Date Noted   Encounter for hepatitis C screening test for low risk patient 10/28/2023   Encounter for screening mammogram for breast cancer 10/28/2023   Medicare annual wellness visit, subsequent 09/20/2021   Status post total replacement of left hip 01/09/2021   Left groin pain 09/14/2020   Colon polyp 01/22/2018   Estrogen deficiency 10/09/2016   Prediabetes 10/09/2016   Obesity 12/24/2014   Hypertension 10/05/2014   Breast cancer screening, high risk patient 10/18/2011   Routine general medical examination at a health care facility 07/02/2011   Encounter for routine gynecological examination 07/02/2011   Osteopenia 01/14/2007   EDEMA 01/14/2007   Past Medical History:  Diagnosis Date   Allergy ?   Pollen   Anemia    Arthritis    Bradycardia 03/2011   mild - at one time with PACs/ asymptomatic    Breast cancer screening, high risk patient 10/18/2011   Cataracts, bilateral    Colon polyp 12/2007   Disorder of bone and cartilage, unspecified    Edema    Family history of malignant neoplasm of breast    evista for BRCA ppx   Fibrocystic breast    Human papillomavirus in conditions classified elsewhere and of unspecified site    Hypertension    Hypopotassemia    Neoplasm of uncertain behavior of skin    Osteopenia    Osteoporosis    Other screening mammogram    Pneumonia    Predominant disturbance of emotions    Routine gynecological examination    Seasonal allergies    Unspecified vitamin D deficiency    Past Surgical History:  Procedure Laterality Date   BREAST BIOPSY  01/20/2010   BREAST BIOPSY  02/20/2010   Fibrocystic change   Breast MRI   01/20/2010   Normal   cataracts  03/24/2011   bilateral   COLONOSCOPY     COLONOSCOPY W/ POLYPECTOMY  2019   DEXA  12/22/2003   osteopenia   DEXA  05/23/2006   stable   DEXA  07/23/2008   Osteopenia-slightly improved   EYE  SURGERY Bilateral    cataracts removed   JOINT REPLACEMENT  01/09/21   Left hip   LEEP     HPV   POLYPECTOMY     procedure for "bad pap"     SKIN BIOPSY Left 2023   hand   TONSILLECTOMY     TOTAL HIP ARTHROPLASTY Left 01/09/2021   Procedure: LEFT TOTAL HIP ARTHROPLASTY ANTERIOR APPROACH;  Surgeon: Tarry Kos, MD;  Location: [facility] OR;  Service: Orthopedics;  Laterality: Left;  C-3   Social History   Tobacco Use   Smoking status: Former    Current packs/day: 0.00    Types: Cigarettes    Quit date: 07/23/1985    Years since quitting: 38.2   Smokeless tobacco: Never   Tobacco comments:    Quit "years ago"  Vaping Use   Vaping status: Never Used  Substance Use Topics   Alcohol use: Yes    Comment: Rare use - have only had one glass of wine in past year   Drug use: No   Family History  Problem Relation Age of Onset   Breast cancer Mother    Osteoporosis Mother    Diabetes Father    Skin cancer Father        ?   Osteopenia Sister    Breast cancer Sister    Heart attack Paternal Grandfather    Breast cancer Other        cousin/Grandmother   Colon cancer Neg Hx    Esophageal cancer Neg Hx    Rectal cancer Neg Hx    Stomach cancer Neg Hx    Colon polyps Neg Hx    Allergies  Allergen Reactions   Alendronate Sodium Other (See Comments)    REACTION: leg and joint pain   Current Outpatient Medications on File Prior to Visit  Medication Sig Dispense Refill   acetaminophen (TYLENOL) 500 MG tablet Take 500 mg by mouth as needed.     Calcium 1200-1000 MG-UNIT CHEW Chew 2 tablets by mouth daily.     Cholecalciferol (VITAMIN D) 50 MCG (2000 UT) CAPS Take 2,000 Units by mouth daily.      loratadine (CLARITIN) 10 MG tablet Take 10 mg by mouth daily.      No current facility-administered medications on file prior to visit.    Review of Systems  Constitutional:  Negative for activity change, appetite change, fatigue, fever and unexpected weight change.  HENT:  Positive for postnasal drip and rhinorrhea. Negative for congestion, ear pain, sinus pressure and sore throat.        Allergies are bad in season   Eyes:  Negative for pain, redness and visual disturbance.  Respiratory:  Negative for cough, shortness of breath and wheezing.   Cardiovascular:  Negative for chest pain and palpitations.       Swelling of left ankle is always worse than the right  Varicose veins  Had hip surgery on that side  Gastrointestinal:  Negative for abdominal pain, blood in stool, constipation and diarrhea.  Endocrine: Negative for polydipsia and polyuria.  Genitourinary:  Negative for dysuria, frequency and urgency.  Musculoskeletal:  Negative for arthralgias, back pain and myalgias.  Skin:  Negative for pallor and rash.  Allergic/Immunologic: Negative for environmental allergies.  Neurological:  Negative for dizziness, syncope and headaches.  Hematological:  Negative for adenopathy. Does not bruise/bleed easily.  Psychiatric/Behavioral:  Negative for decreased concentration and dysphoric mood. The patient is not nervous/anxious.        Objective:  Physical Exam Constitutional:      General: She is not in acute distress.    Appearance: Normal appearance. She is well-developed. She is obese. She is not ill-appearing or diaphoretic.  HENT:     Head: Normocephalic and atraumatic.     Right Ear: Tympanic membrane, ear canal and external ear normal.     Left Ear: Tympanic membrane, ear canal and external ear normal.     Nose: Nose normal. No congestion.     Mouth/Throat:     Mouth: Mucous membranes are moist.     Pharynx: Oropharynx is clear. No posterior oropharyngeal erythema.  Eyes:     General: No scleral icterus.    Extraocular Movements:  Extraocular movements intact.     Conjunctiva/sclera: Conjunctivae normal.     Pupils: Pupils are equal, round, and reactive to light.  Neck:     Thyroid: No thyromegaly.     Vascular: No carotid bruit or JVD.  Cardiovascular:     Rate and Rhythm: Normal rate and regular rhythm.     Pulses: Normal pulses.     Heart sounds: Normal heart sounds.     No gallop.  Pulmonary:     Effort: Pulmonary effort is normal. No respiratory distress.     Breath sounds: Normal breath sounds. No wheezing.     Comments: Good air exch Chest:     Chest wall: No tenderness.  Abdominal:     General: Bowel sounds are normal. There is no distension or abdominal bruit.     Palpations: Abdomen is soft. There is no mass.     Tenderness: There is no abdominal tenderness.     Hernia: No hernia is present.  Genitourinary:    Comments: Breast exam: No mass, nodules, thickening, tenderness, bulging, retraction, inflamation, nipple discharge or skin changes noted.  No axillary or clavicular LA.     Musculoskeletal:        General: No tenderness. Normal range of motion.     Cervical back: Normal range of motion and neck supple. No rigidity. No muscular tenderness.     Right lower leg: No edema.     Left lower leg: No edema.     Comments: No kyphosis   Lymphadenopathy:     Cervical: No cervical adenopathy.  Skin:    General: Skin is warm and dry.     Coloration: Skin is not pale.     Findings: No erythema or rash.     Comments: Solar lentigines diffusely  Scattered sks   Neurological:     Mental Status: She is alert. Mental status is at baseline.     Cranial Nerves: No cranial nerve deficit.     Motor: No abnormal muscle tone.     Coordination: Coordination normal.     Gait: Gait normal.     Deep Tendon Reflexes: Reflexes are normal and symmetric. Reflexes normal.  Psychiatric:        Mood and Affect: Mood normal.        Cognition and Memory: Cognition and memory normal.           Assessment &  Plan:   Problem List Items Addressed This Visit       Cardiovascular and Mediastinum   Hypertension   bp in fair control at this time  BP Readings from Last 1 Encounters:  10/28/23 118/76   No changes needed Most recent labs reviewed  Disc lifstyle change with low sodium diet and exercise  Plan to continue  hctz 25 mg daily      Relevant Medications   hydrochlorothiazide (HYDRODIURIL) 25 MG tablet     Digestive   Colon polyp   Colonoscopy 07/2021 with 5 y recall         Musculoskeletal and Integument   Osteopenia   Dexa ordered  Last one 05/2019  Reviewed Alendronate caused bony pain  Evista caused hair loss  Disc strength building exercise On ca and D E level in nl range  No falls or fx        Other   Routine general medical examination at a health care facility - Primary   Reviewed health habits including diet and exercise and skin cancer prevention Reviewed appropriate screening tests for age  Also reviewed health mt list, fam hx and immunization status , as well as social and family history   See HPI Labs reviewed and ordered Health Maintenance  Topic Date Due   Hepatitis C Screening  Never done   Zoster (Shingles) Vaccine (1 of 2) 01/27/2024*   DTaP/Tdap/Td vaccine (3 - Td or Tdap) 10/27/2024*   COVID-19 Vaccine (6 - 2024-25 season) 11/12/2025*   Flu Shot  02/21/2024   Mammogram  07/10/2024   Medicare Annual Wellness Visit  10/23/2024   Colon Cancer Screening  08/18/2026   Pneumonia Vaccine  Completed   DEXA scan (bone density measurement)  Completed   HPV Vaccine  Aged Out  *Topic was postponed. The date shown is not the original due date.    Considering shingrix vaccine Mammo and dexa ordered Discussed fall prevention, supplements and exercise for bone density  PHQ 0       Prediabetes   Lab Results  Component Value Date   HGBA1C 6.0 10/21/2023   HGBA1C 6.0 10/22/2022   HGBA1C 6.0 09/13/2021   disc imp of low glycemic diet and wt loss  to prevent DM2  Pt snacks at night-discussed strategies for this / hobby/keep hands busy/ do not buy processed snack foods       Obesity   Discussed how this problem influences overall health and the risks it imposes  Reviewed plan for weight loss with lower calorie diet (via better food choices (lower glycemic and portion control) along with exercise building up to or more than 30 minutes 5 days per week including some aerobic activity and strength training         Estrogen deficiency   Dexa ordered at [facility]       Relevant Orders   DG Bone Density   Encounter for screening mammogram for breast cancer   Mammogram ordered for [facility] Is high risk      Relevant Orders   MM 3D SCREENING MAMMOGRAM BILATERAL BREAST   Encounter for hepatitis C screening test for low risk patient   Hep C screen today       Relevant Orders   Hepatitis C Antibody

## 2023-10-28 NOTE — Assessment & Plan Note (Signed)
 Mammogram ordered for [facility] Is high risk

## 2023-10-28 NOTE — Assessment & Plan Note (Signed)
 Discussed how this problem influences overall health and the risks it imposes  Reviewed plan for weight loss with lower calorie diet (via better food choices (lower glycemic and portion control) along with exercise building up to or more than 30 minutes 5 days per week including some aerobic activity and strength training

## 2023-10-28 NOTE — Assessment & Plan Note (Signed)
Hep C screen today 

## 2023-10-29 ENCOUNTER — Encounter: Payer: Self-pay | Admitting: Family Medicine

## 2023-10-29 LAB — HEPATITIS C ANTIBODY: Hepatitis C Ab: NONREACTIVE

## 2023-12-08 ENCOUNTER — Other Ambulatory Visit: Payer: Self-pay | Admitting: Family Medicine

## 2024-07-13 ENCOUNTER — Ambulatory Visit

## 2024-07-13 ENCOUNTER — Other Ambulatory Visit

## 2024-07-13 LAB — HM MAMMOGRAPHY

## 2024-07-14 ENCOUNTER — Encounter: Payer: Self-pay | Admitting: Family Medicine

## 2024-10-21 ENCOUNTER — Other Ambulatory Visit

## 2024-10-26 ENCOUNTER — Ambulatory Visit

## 2024-10-28 ENCOUNTER — Encounter: Admitting: Family Medicine
